# Patient Record
Sex: Male | Born: 1945
Health system: Southern US, Community
[De-identification: ages and names within clinical notes are randomized; demographics above are authoritative.]

## PROBLEM LIST (undated history)

## (undated) DIAGNOSIS — M5136 Other intervertebral disc degeneration, lumbar region: Secondary | ICD-10-CM

## (undated) DIAGNOSIS — G8929 Other chronic pain: Secondary | ICD-10-CM

## (undated) DIAGNOSIS — Z8781 Personal history of (healed) traumatic fracture: Secondary | ICD-10-CM

## (undated) DIAGNOSIS — I700 Atherosclerosis of aorta: Secondary | ICD-10-CM

## (undated) DIAGNOSIS — M1990 Unspecified osteoarthritis, unspecified site: Secondary | ICD-10-CM

## (undated) DIAGNOSIS — M25561 Pain in right knee: Secondary | ICD-10-CM

## (undated) DIAGNOSIS — M51369 Other intervertebral disc degeneration, lumbar region without mention of lumbar back pain or lower extremity pain: Secondary | ICD-10-CM

## (undated) DIAGNOSIS — M549 Dorsalgia, unspecified: Secondary | ICD-10-CM

## (undated) DIAGNOSIS — C186 Malignant neoplasm of descending colon: Secondary | ICD-10-CM

## (undated) DIAGNOSIS — Z8709 Personal history of other diseases of the respiratory system: Secondary | ICD-10-CM

## (undated) DIAGNOSIS — M25551 Pain in right hip: Secondary | ICD-10-CM

## (undated) DIAGNOSIS — K56609 Unspecified intestinal obstruction, unspecified as to partial versus complete obstruction: Secondary | ICD-10-CM

## (undated) DIAGNOSIS — N200 Calculus of kidney: Secondary | ICD-10-CM

## (undated) DIAGNOSIS — Z8639 Personal history of other endocrine, nutritional and metabolic disease: Secondary | ICD-10-CM

## (undated) DIAGNOSIS — L03114 Cellulitis of left upper limb: Secondary | ICD-10-CM

## (undated) HISTORY — PX: INNER EAR SURGERY: SHX679

## (undated) HISTORY — PX: COLOSTOMY: SHX63

## (undated) HISTORY — PX: PROSTATE BIOPSY: SHX241

## (undated) HISTORY — PX: COLONOSCOPY: SHX174

## (undated) HISTORY — PX: TONSILLECTOMY: SUR1361

## (undated) HISTORY — PX: BACK SURGERY: SHX140

## (undated) HISTORY — PX: COLON RESECTION: SHX5231

---

## 1999-01-23 ENCOUNTER — Ambulatory Visit (HOSPITAL_COMMUNITY): Admission: RE | Admit: 1999-01-23 | Discharge: 1999-01-23 | Payer: Self-pay | Admitting: Internal Medicine

## 1999-01-23 ENCOUNTER — Encounter: Payer: Self-pay | Admitting: Neurosurgery

## 1999-02-13 ENCOUNTER — Ambulatory Visit (HOSPITAL_COMMUNITY): Admission: RE | Admit: 1999-02-13 | Discharge: 1999-02-13 | Payer: Self-pay | Admitting: Neurosurgery

## 1999-02-13 ENCOUNTER — Encounter: Payer: Self-pay | Admitting: Neurosurgery

## 1999-02-27 ENCOUNTER — Ambulatory Visit (HOSPITAL_COMMUNITY): Admission: RE | Admit: 1999-02-27 | Discharge: 1999-02-27 | Payer: Self-pay | Admitting: Neurosurgery

## 1999-02-27 ENCOUNTER — Encounter: Payer: Self-pay | Admitting: Neurosurgery

## 1999-03-13 ENCOUNTER — Encounter: Payer: Self-pay | Admitting: Neurosurgery

## 1999-03-13 ENCOUNTER — Ambulatory Visit (HOSPITAL_COMMUNITY): Admission: RE | Admit: 1999-03-13 | Discharge: 1999-03-13 | Payer: Self-pay | Admitting: Neurosurgery

## 2005-05-15 ENCOUNTER — Inpatient Hospital Stay (HOSPITAL_COMMUNITY): Admission: RE | Admit: 2005-05-15 | Discharge: 2005-05-19 | Payer: Self-pay | Admitting: Neurosurgery

## 2005-05-15 ENCOUNTER — Encounter (INDEPENDENT_AMBULATORY_CARE_PROVIDER_SITE_OTHER): Payer: Self-pay | Admitting: Specialist

## 2005-06-10 ENCOUNTER — Encounter: Admission: RE | Admit: 2005-06-10 | Discharge: 2005-06-10 | Payer: Self-pay | Admitting: Thoracic Surgery

## 2005-07-08 ENCOUNTER — Encounter: Admission: RE | Admit: 2005-07-08 | Discharge: 2005-07-08 | Payer: Self-pay | Admitting: Thoracic Surgery

## 2005-09-09 ENCOUNTER — Encounter: Admission: RE | Admit: 2005-09-09 | Discharge: 2005-09-09 | Payer: Self-pay | Admitting: Thoracic Surgery

## 2005-12-16 ENCOUNTER — Encounter: Admission: RE | Admit: 2005-12-16 | Discharge: 2005-12-16 | Payer: Self-pay | Admitting: Thoracic Surgery

## 2006-05-24 ENCOUNTER — Ambulatory Visit (HOSPITAL_BASED_OUTPATIENT_CLINIC_OR_DEPARTMENT_OTHER): Admission: RE | Admit: 2006-05-24 | Discharge: 2006-05-25 | Payer: Self-pay | Admitting: Otolaryngology

## 2007-02-11 ENCOUNTER — Encounter: Admission: RE | Admit: 2007-02-11 | Discharge: 2007-02-11 | Payer: Self-pay | Admitting: Family Medicine

## 2007-03-17 DIAGNOSIS — D369 Benign neoplasm, unspecified site: Secondary | ICD-10-CM

## 2007-03-17 HISTORY — DX: Benign neoplasm, unspecified site: D36.9

## 2007-06-14 IMAGING — CR DG CHEST 1V PORT
1 series · 1 of 1 positions shown · non-contrast
Comparison: none

CLINICAL DATA: Status post placement of central line.
 PORTABLE CHEST - 1 VIEW - 05/15/05 AT 1095 HOURS:
 The right jugular central venous catheter tip is in the SVC.  No pneumothorax.

[view not recorded]
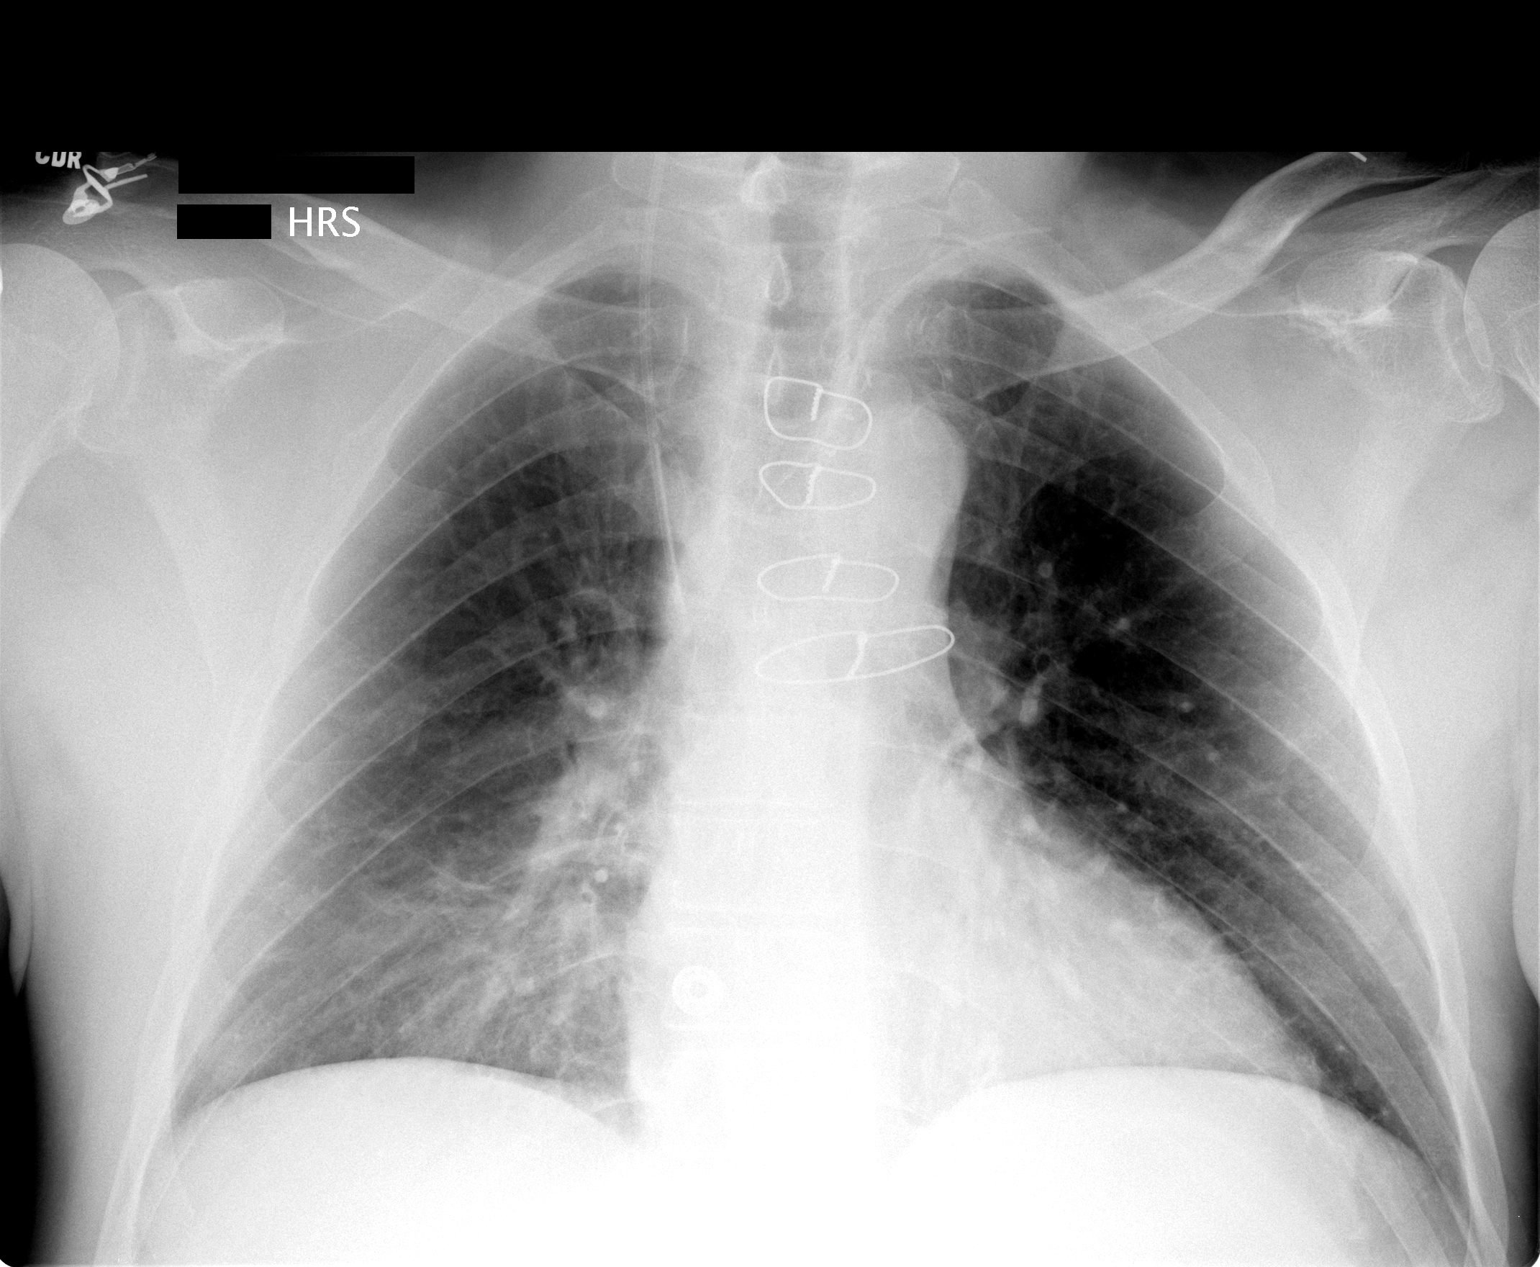

[1 of 1 positions shown; findings below may reference images not displayed]

IMPRESSION: No pneumothorax post placement of right IJ catheter.

## 2008-02-15 ENCOUNTER — Ambulatory Visit: Payer: Self-pay | Admitting: Vascular Surgery

## 2008-05-18 ENCOUNTER — Ambulatory Visit: Payer: Self-pay | Admitting: Vascular Surgery

## 2008-07-04 ENCOUNTER — Ambulatory Visit: Payer: Self-pay | Admitting: Vascular Surgery

## 2008-07-11 ENCOUNTER — Ambulatory Visit: Payer: Self-pay | Admitting: Vascular Surgery

## 2008-08-24 ENCOUNTER — Ambulatory Visit: Payer: Self-pay | Admitting: Vascular Surgery

## 2009-03-12 IMAGING — US US SCROTUM
1 series · 14 of 25 positions shown · non-contrast
Comparison: none

CLINICAL DATA: Reported right testicular mass felt on exam. 
SCROTAL ULTRASOUND:
TECHNIQUE: Complete ultrasound examination of the testicles, epididymis, and other scrotal structures was performed.

[Series 1: unknown · 0.09mm/px · 14 of 39 slices shown]
[im 1/39]
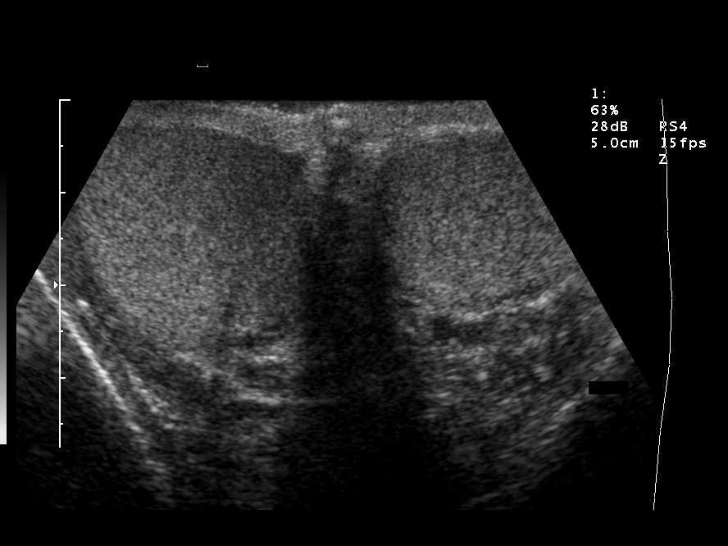
[im 4/39]
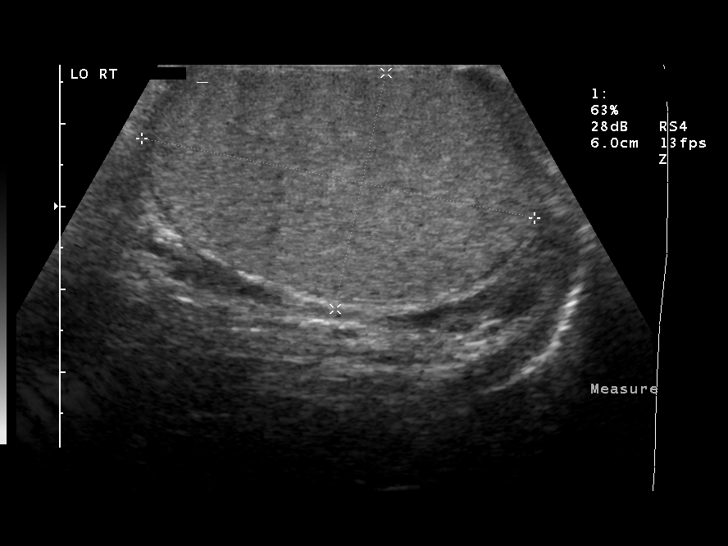
[im 7/39]
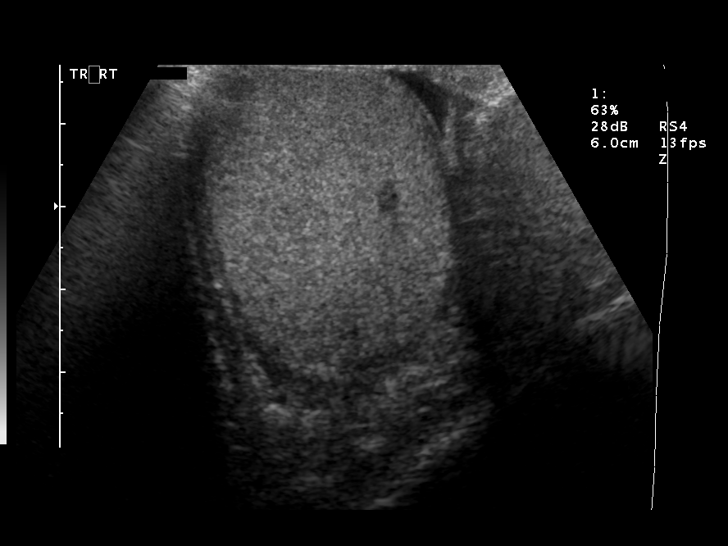
[im 10/39]
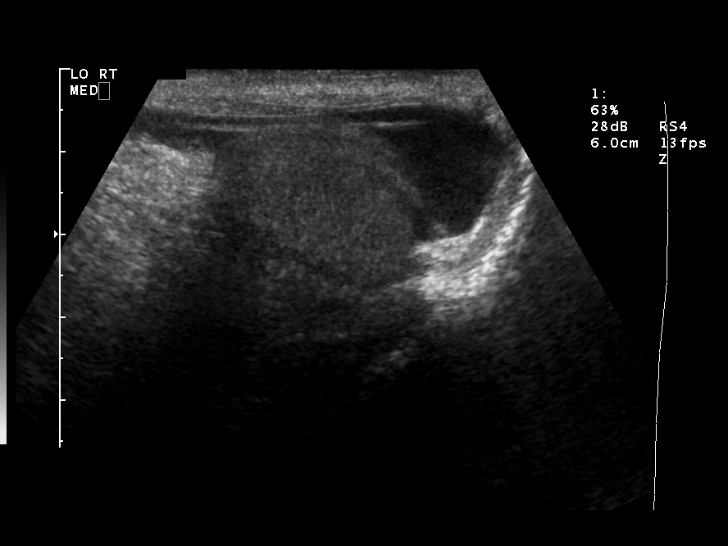
[im 13/39]
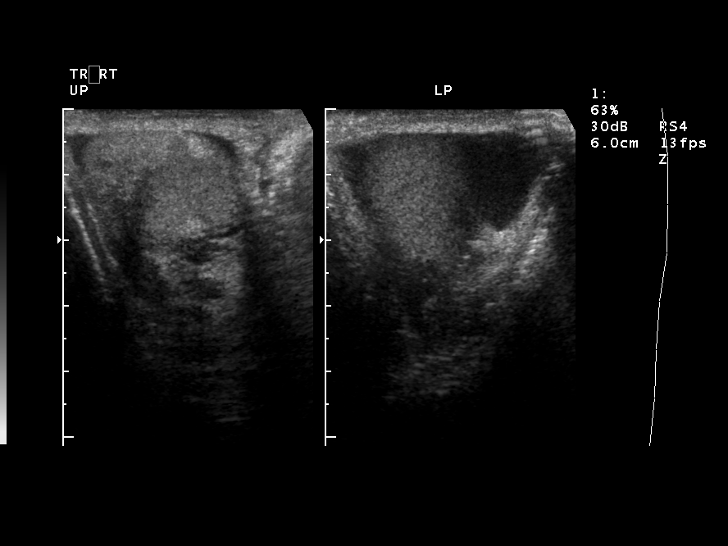
[im 15/39]
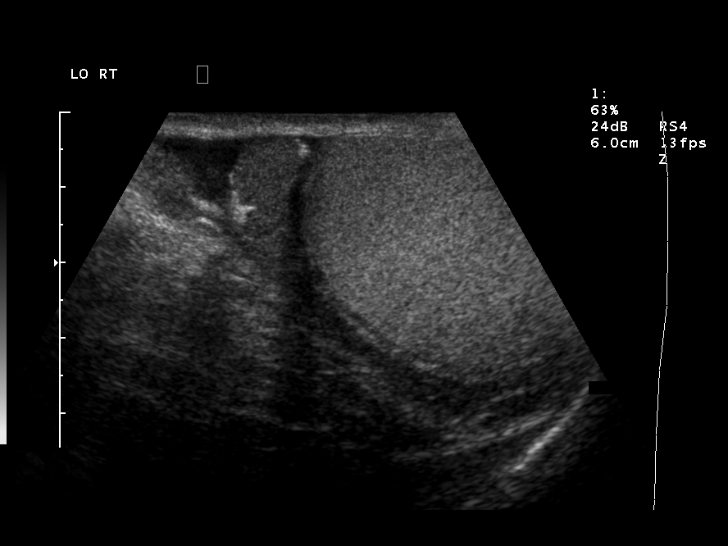
[im 18/39]
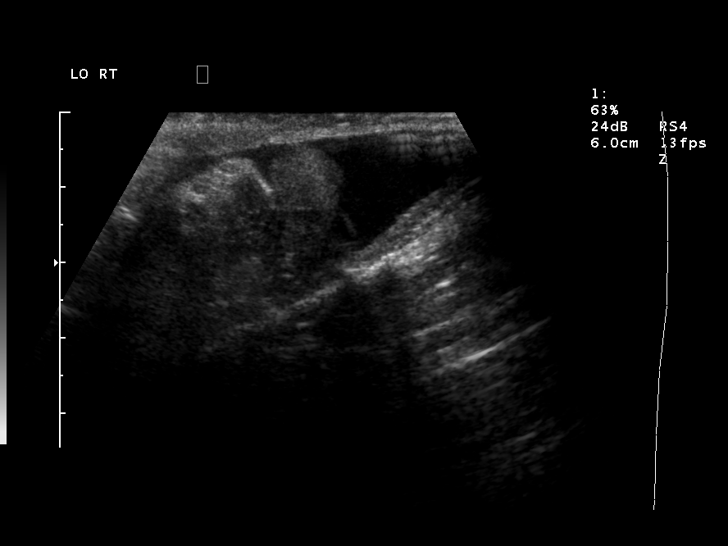
[im 21/39]
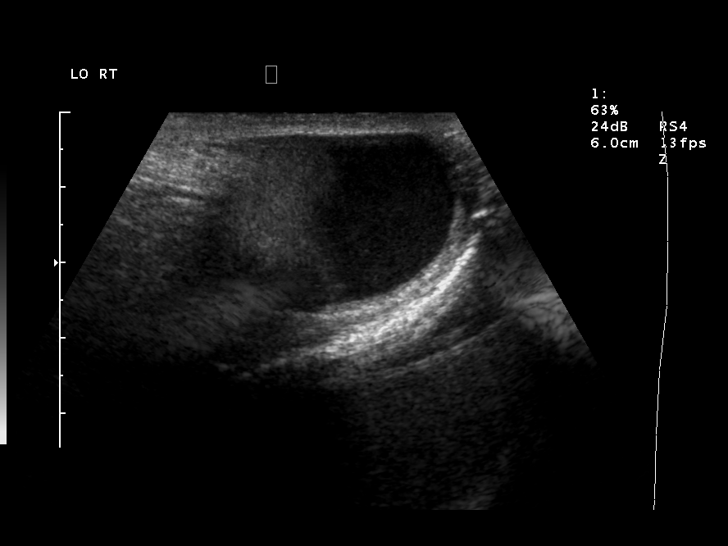
[im 24/39]
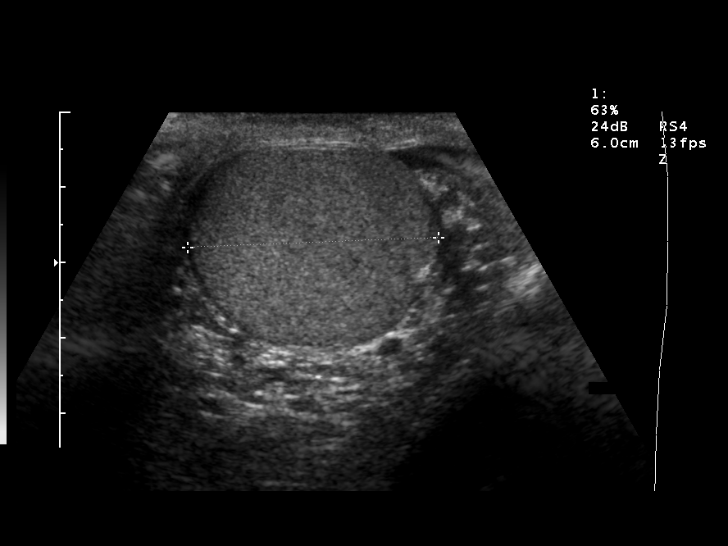
[im 26/39]
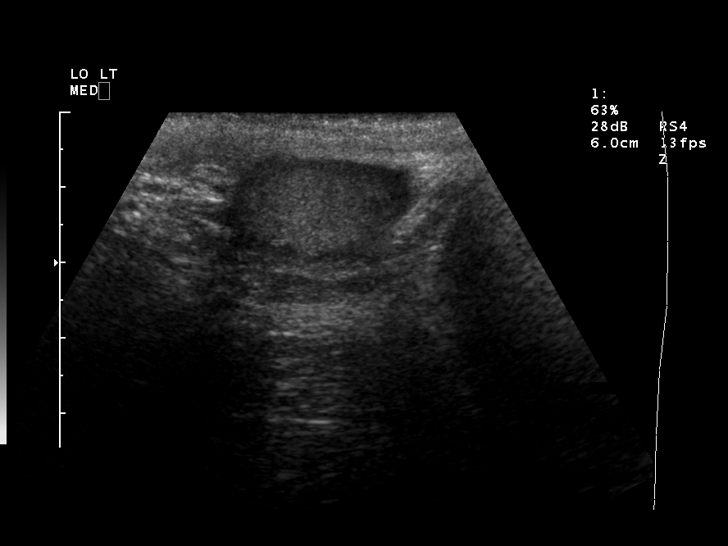
[im 29/39]
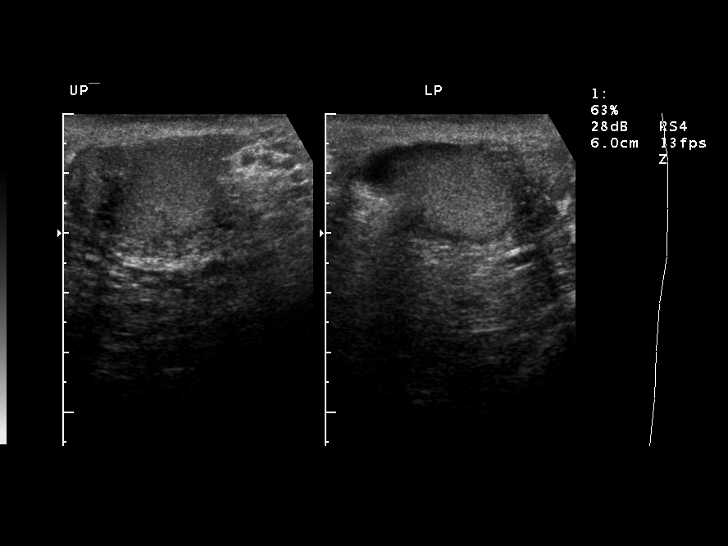
[im 32/39]
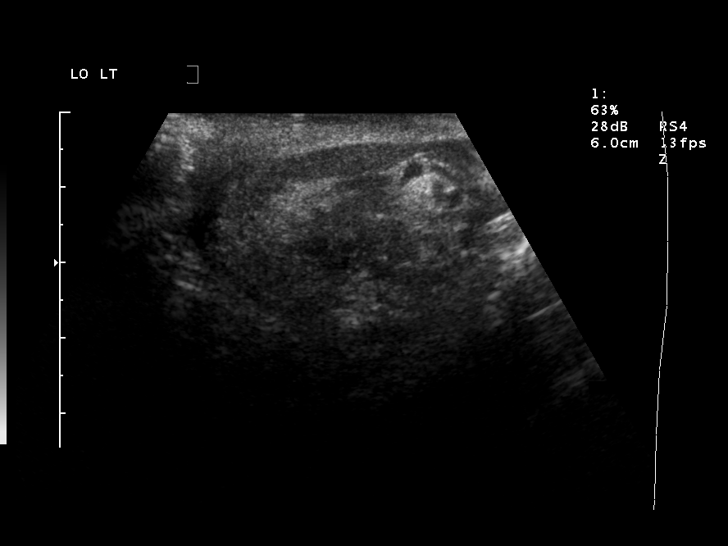
[im 35/39]
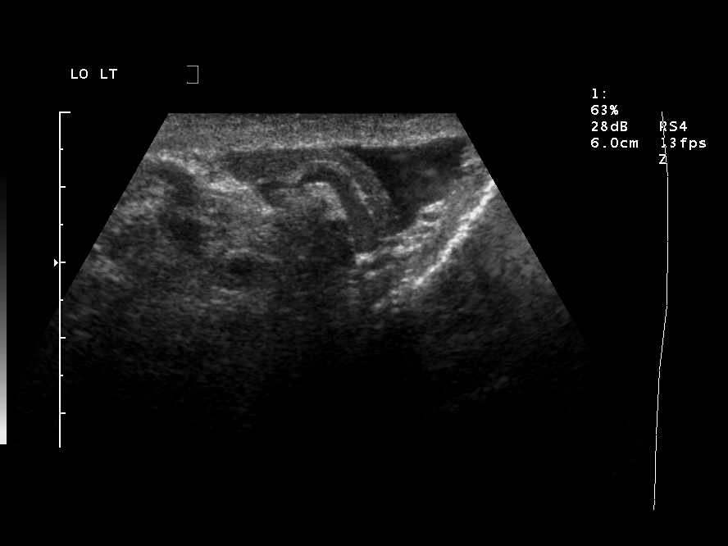
[im 39/39]
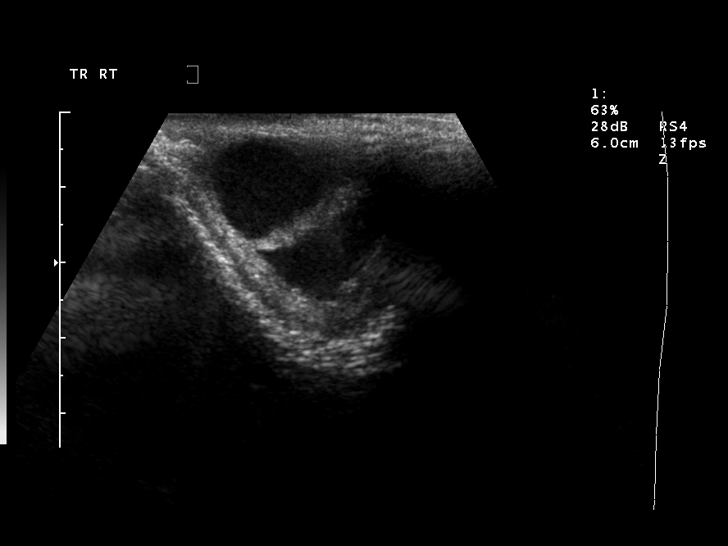

[14 of 25 positions shown; findings below may reference images not displayed]

FINDINGS: Right testicle measures 4.8 cm in length and 2.9 x 3.0 cm in transverse dimensions.  The left testicle measures 4.5 cm in length and 2.5 x 3.3 cm in transverse dimensions.  No evidence of a scrotal mass.  Epididymides unremarkable.  Hydroceles right larger than left.  No varicocele.  Normal color flow assessment.
IMPRESSION: Specifically, no evidence of a right testicular mass.  Hydroceles larger on the right than left.

## 2009-09-04 ENCOUNTER — Observation Stay (HOSPITAL_COMMUNITY): Admission: EM | Admit: 2009-09-04 | Discharge: 2009-09-04 | Payer: Self-pay | Admitting: Emergency Medicine

## 2011-03-05 LAB — DIFFERENTIAL
Basophils Absolute: 0.1 10*3/uL (ref 0.0–0.1)
Eosinophils Absolute: 0.1 10*3/uL (ref 0.0–0.7)
Lymphocytes Relative: 24 % (ref 12–46)
Lymphs Abs: 1.9 10*3/uL (ref 0.7–4.0)
Monocytes Absolute: 0.6 10*3/uL (ref 0.1–1.0)
Neutro Abs: 5.2 10*3/uL (ref 1.7–7.7)

## 2011-03-05 LAB — BASIC METABOLIC PANEL
BUN: 12 mg/dL (ref 6–23)
CO2: 27 mEq/L (ref 19–32)
Calcium: 9.3 mg/dL (ref 8.4–10.5)
Chloride: 103 mEq/L (ref 96–112)
Creatinine, Ser: 1.05 mg/dL (ref 0.4–1.5)

## 2011-03-05 LAB — CBC
Hemoglobin: 14.4 g/dL (ref 13.0–17.0)
MCHC: 33.7 g/dL (ref 30.0–36.0)
WBC: 8 10*3/uL (ref 4.0–10.5)

## 2011-04-14 NOTE — Assessment & Plan Note (Signed)
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  The  procedure was on 07/04/08.  He did quite well with the procedure.   He has resolved all postoperative bruising and reports that his leg  feels back to normal.  He does not have any recurrent varicosities.  He  is pleased with the results, as am I, and will see [place] again on an as-  needed basis.   Larina Earthly, M.D.  Electronically Signed   TFE/MEDQ  D:  08/24/2008  T:  08/27/2008  Job:  [id number]   cc:   L. Lupe Carney, M.D.

## 2011-04-14 NOTE — Consult Note (Signed)
NEW PATIENT CONSULTATION   James Doyle, James Doyle  DOB:  August 05, 1946                                       02/15/2008  ZOXWR#:[id number]   The patient presents today for evaluation of his right leg venous  varicosities.  He has seen Dr. Hart Rochester for similar evaluation in June  2007.  He has had a history of prior bilateral tributary varicosity  removal by Dr. Marcy Panning in 1999.  He has had recurrence of  varicosities in his right thigh, and has had progressive discomfort  related to these.  He does have pain specifically over these  varicosities and some lower extremity swelling as well.  He has  curtailed his usual activities related to this.  He did shoe horses in  the past, and this is impossible due to the pain associated with the  varicosities in the medial thigh in holding the horses hoof.  He did  wear compression garments for a period of time and did not see any  specific relief, but has not tried these for several years.  Venous  duplex at that time revealed reflux in his surface veins in the right  and no deep venous reflux.   PHYSICAL EXAM:  He does have 2+ dorsalis pedis pulse and does have  marked varicosities in his medial thigh.  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  I discussed options again with the patient.  We will refit him  with graduated compression stockings.  I plan to see him again in 3  months with a repeat forma duplex, since it has been several years since  his last study.  I suspect he would be an excellent candidate for laser  ablation and stab phlebectomy should he fail treatment from the  compression garments.   Larina Earthly, M.D.  Electronically Signed   TFE/MEDQ  D:  02/15/2008  T:  02/16/2008  Job:  [id number]   cc:   L. Lupe Carney, M.D.

## 2011-04-14 NOTE — Assessment & Plan Note (Signed)
OFFICE VISIT   James Doyle, James Doyle  DOB:  08-31-1946                                       07/11/2008  ZOXWR#:[id number]   The patient presents today for 1 week followup laser ablation of his  right great saphenous vein and stab phlebectomy of multiple tributary  varicosities.  He has done quite well following the procedure.  He has  the usual amount of bruising and erythema which is resolving.  All of  his stab phlebectomy sites are healing nicely.   He underwent repeat duplex in our office today and this revealed no  evidence of injury to the deep venous system and closure of his  saphenous vein.   I am quite pleased with his initial result as is the patient.  We plan  to see him again in 6 weeks for final followup.   Larina Earthly, M.D.  Electronically Signed   TFE/MEDQ  D:  07/11/2008  T:  07/12/2008  Job:  [id number]   cc:   L. Lupe Carney, M.D.

## 2011-04-14 NOTE — Assessment & Plan Note (Signed)
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  He has  worn graduated compression stockings, thigh-high for 3 months.  He  reports that this has not given any improvement regarding the pain that  he has over these areas.  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  He also has complaints of  prolonged sitting if he is traveling in the car, complaining of  difficulty with leg pain and swelling, and also difficulty with yard  work that requires squatting position due to pain.   PHYSICAL EXAM:  He does have marked saphenous varicosities in his right  thigh.   He underwent a formal duplex today, and this reveals significant reflux  in his saphenous veins form the saphenofemoral junction to the mid thigh  where a large nest of varicosities arise.  I have recommended that we  proceed with laser ablation of his right greater saphenous vein from mid  thigh proximally and stab phlebectomy with multiple large tributaries  throughout his more distal thigh.  He understands that the procedure is  an outpatient procedure under local anesthesia, and that he will be able  to be ambulatory immediately following the procedure.  We will schedule  this at his earliest convenience.   Larina Earthly, M.D.  Electronically Signed   TFE/MEDQ  D:  05/18/2008  T:  05/21/2008  Job:  [id number]   cc:   L. Lupe Carney, M.D.

## 2011-04-14 NOTE — Procedures (Signed)
DUPLEX DEEP VENOUS EXAM - LOWER EXTREMITY   INDICATION:  Followup, right greater saphenous vein ablation.   HISTORY:  Edema:  No.  Trauma/Surgery:  Yes.  Pain:  No.  PE:  No.  Previous DVT:  No.  Anticoagulants:  Other:   DUPLEX EXAM:                CFV   SFV   PopV  PTV    GSV                R  L  R  L  R  L  R   L  R  L  Thrombosis    o     o     o     o      +  Spontaneous   +     +     +     +      0  Phasic        +     +     +     +      0  Augmentation  +     +     +     +      0  Compressible  +     +     +     +      0  Competent     +     +     +     +      0   Legend:  + - yes  o - no  p - partial  D - decreased   IMPRESSION:  1. No evidence of deep venous thrombosis noted in the right leg.  2. Right greater saphenous vein appears thrombosed from proximal to      mid-to-distal thigh.    _____________________________  Larina Earthly, M.D.   MG/MEDQ  D:  07/11/2008  T:  07/11/2008  Job:  [id number]

## 2011-04-14 NOTE — Procedures (Signed)
LOWER EXTREMITY VENOUS REFLUX EXAM   INDICATION:  Right leg varicose vein with pain and swelling.   EXAM:  Using color-flow imaging and pulse Doppler spectral analysis, the  right common femoral, superficial femoral, popliteal, posterior tibial,  greater and lesser saphenous veins are evaluated.  There is no evidence  suggesting deep venous insufficiency in the right lower extremity.   The right saphenofemoral junction is not competent.  The right GSV is  not competent with the caliber as described below.   The right proximal short saphenous vein demonstrates competency.   GSV Diameter (used if found to be incompetent only)                                            Right    Left  Proximal Greater Saphenous Vein           0.89 cm  cm  Proximal-to-mid-thigh                     0.89 cm  cm  Mid thigh                                 0.77 cm  cm  Mid-distal thigh                          0.77 cm  cm  Distal thigh                              0.56 cm  cm  Knee                                      0.54 cm  cm   IMPRESSION:  1. The right greater saphenous vein reflux is identified with the      caliber ranging from 0.54 cm to 0.97 cm knee to groin.  2. The right greater saphenous vein is not aneurysmal.  3. The right greater saphenous vein is tortuous, distal thigh and      below.  4. The deep venous system is competent.  5. The right lesser saphenous vein is competent.  6. There is no evidence of deep venous thrombosis noted in the right      leg.   ___________________________________________  Larina Earthly, M.D.   MG/MEDQ  D:  05/18/2008  T:  05/18/2008  Job:  [id number]

## 2011-04-17 NOTE — Discharge Summary (Signed)
NAMEHUTCH, James Doyle                ACCOUNT NO.:  [id number]   MEDICAL RECORD NO.:  [id number]          PATIENT TYPE:  INP   LOCATION:  3006                         FACILITY:  [facility]   PHYSICIAN:  Coletta Memos, M.D.     DATE OF BIRTH:  11/25/1946   DATE OF ADMISSION:  05/15/2005  DATE OF DISCHARGE:  05/19/2005                                 DISCHARGE SUMMARY   ADMISSION DIAGNOSIS:  Displaced disk, left T1-2.   DISCHARGE DIAGNOSES:  1.  Displaced disk, left T1-2.  2.  Cervical radiculopathy.   PROCEDURE:  Anterior cervical decompression T1-2 with arthrodesis using  allograft via sternal- splitting approach, performed by Dr. Ines Bloomer.  The neurosurgical procedure was performed by myself, Dr. Coletta Memos.   DISCHARGE STATUS:  Alive and well.   MEDICATIONS:  Vicodin and Flexeril.   COMPLICATIONS:  Mild recurrent laryngeal nerve palsy. The patient is hoarse  now, versus his original voice.  No problems with swallowing.  No problems  with eating.  The wound is clean and dry without problems of infection at  discharge.   HISTORY:  [name] is a patient of mine whom I had done a lumbar procedure  on in the past.  He presented and had a large disk herniation at T1-2.  I  therefore recommended and he agreed to undergo a T1-2 diskectomy and  arthrodesis.   HOSPITAL COURSE:  This was done with the assistance of Dr. Edwyna Shell, in order  to gain adequate exposure.  The patient had an uncomplicated procedure.  Postoperatively he did quite well.   DISPOSITION/CONDITION ON DISCHARGE:  He was discharged home after checking x-  rays with the JP removed.  No re-accumulation of fluid.  His wound is clean  and dry without signs of infection at discharge.  He is tolerating a regular  diet, ambulating and voiding without difficulty.   FOLLOWUP:  1.  I will see him back in the office in approximately three to four weeks.  2.  He has an appointment with Dr. Edwyna Shell.      KC/MEDQ  D:   05/19/2005  T:  05/19/2005  Job:  [id number]

## 2011-04-17 NOTE — Op Note (Signed)
James Doyle, James Doyle                ACCOUNT NO.:  [id number]   MEDICAL RECORD NO.:  [id number]          PATIENT TYPE:  AMB   LOCATION:  DSC                          FACILITY:  [facility]   PHYSICIAN:  Jefry H. Pollyann Kennedy, MD     DATE OF BIRTH:  [birth year]   DATE OF PROCEDURE:  05/24/2006  DATE OF DISCHARGE:                                 OPERATIVE REPORT   PREOPERATIVE DIAGNOSIS:  Left vocal cord paralysis with dysphonia.   POSTOPERATIVE DIAGNOSIS:  Left vocal cord paralysis with dysphonia.   PROCEDURE:  Left vocal cord medialization laryngoplasty.   SURGEON:  Dr. Pollyann Kennedy.   ANESTHESIA:  Local anesthesia was used with intravenous sedation.   COMPLICATIONS:  No complications.   FINDINGS:  Paralysis with left paramedian position of the vocal cord.   PHYSICIAN:  Dr. Lupe Carney.   BLOOD LOSS:  Minimal.   HISTORY:  A 65 year old gentleman underwent a cervical disk surgery last  year and developed a postoperative left vocal cord paralysis.  He has a  fairly good voice early the morning, but it fatigues and he looses his  efficiency and his strength and stamina as the day goes on.  He has been  followed in the office for several months without any evidence of  resolution.  Risks, benefits, alternatives, complications of the procedure  were explained to the patient who seemed to understand and agree to surgery.   PROCEDURE:  The patient was taken to the operating room and placed on the  operating table in a supine position.  Following induction of intravenous  sedation and topical anesthesia in the right side of the nose, a fiberoptic  flexible laryngoscope was inserted into the right side of nose and used with  a video to examine the larynx.  This was placed on a Mayo stand above the  patient's head for visualization during the procedure.  The neck was prepped  and draped in standard fashion.  Xylocaine with epinephrine was infiltrated  along a cervical skin crease at the mid portion of  the thyroid cartilage.  A  15 scalpel was used to create an incision about 4 cm in length to the left  of midline along the skin crease.  Sharp and electrocautery dissection was  used to dissect down to the anterior aspect of the thyroid ala.  The  periosteum was reflected laterally.  A high-speed side-cutting bur drill was  used to create the window in the thyroid lamina.  It was approximately 2 or  3 mm above the inferior aspect and about 3 mm to the left of the midline.  It was about 0.5 cm in height and 1 cm in length.  The inner periosteum was  then elevated using a Public house manager.  Using video laryngoscopy at the  same time, I was able to assess the position of the vocal cord in relation  to the window.  A piece of Silastic was then cut with a wedge-shaped  insertion to fit within the thyroid window, and multiple modifications were  made to this while having the patient phonate.  After good positioning of  the cord was achieved with good vocal results, the wound was irrigated with  saline and closed in layers using 4-0 chromic in the  deeper layers and 4-0 nylon on the skin.  A rubber band drain was kept in  the wound and exited through the anterior aspect of the incision.  A  dressing was applied.  The patient was then awakened and transferred to  recovery in stable condition.      Jefry H. Pollyann Kennedy, MD  Electronically Signed     JHR/MEDQ  D:  05/24/2006  T:  05/24/2006  Job:  [id number]   cc:   L. Lupe Carney, M.D.  Fax: [phone]   Ines Bloomer, M.D.  [address]  [place]  [place] [address]   Coletta Memos, M.D.  Fax: [phone]

## 2011-04-17 NOTE — Op Note (Signed)
NAMESAMIT, SYLVE NO.:  [id number]   MEDICAL RECORD NO.:  [id number]          PATIENT TYPE:  INP   LOCATION:  2899                         FACILITY:  [facility]   PHYSICIAN:  Coletta Memos, M.D.     DATE OF BIRTH:  [birth year]   DATE OF PROCEDURE:  05/15/2005  DATE OF DISCHARGE:                                 OPERATIVE REPORT   PREOPERATIVE DIAGNOSIS:  Displaced disk, T1-2, left.   POSTOPERATIVE DIAGNOSES:  1.  T1-2 herniated nucleus pulposus, left.  2.  Thoracic radiculopathy.   PROCEDURE:  Anterior cervical decompression, T1-2; arthrodesis, T1-2,  approach performed by Dr. Mattie Marlin including median sternotomy.   CO-SURGEON:  Coletta Memos, M.D.   Mammie LorenzoInes Bloomer, M.D.   ASSISTANT:  Hewitt Shorts, M.D.   INDICATIONS:  Kelven Flater is a 65 year old who presented with pain in a T1  distribution on the left side secondary to what was a fairly large herniated  disk at T1-2 on the left side.  I therefore recommended and he agreed to  undergo operative decompression.  However, since the disk was at T1-2, I  felt it best that the approach be performed by  Dr. Karle Plumber, general  thoracic surgeon, and he will dictate under separate cover his approach.   OPERATIVE NOTE:  After approach had been finalized, I placed spinal needles  at what turned out to be C6-7 and then counted down to the disk space of  what I believed to be T1-2.  I had Dr. Shirlean Kelly, my partner, also  count.  I then opened the disk space with a #15 blade and with the use of  microscopic dissection at T1-2, I proceeded to remove the disk and remove  some of the osteophytes.  With Dr. Earl Gala assistance, he and I were able  to do this and fully decompress the left T1 nerve root.  We did not at all  do an aggressive decompression of the right nerve root, as he had had no  problems on that side.  After that was performed, there was a space within  the bone and what I did  was size a piece of iliac crest graft and shape it.  I then placed that into the disk space.  I did not place a plate as the bone  was quite secure and I thought that the extra time and possible risks of  placing a plate were not worth the possible additional stability.  I  therefore irrigated.  Dr. Edwyna Shell then closed the wound in layered fashion  and again that as dictated under separate cover.       KC/MEDQ  D:  05/15/2005  T:  05/16/2005  Job:  [id number]

## 2011-04-17 NOTE — Op Note (Signed)
NAMECORLEY, MAFFEO NO.:  [id number]   MEDICAL RECORD NO.:  [id number]          PATIENT TYPE:  INP   LOCATION:  2899                         FACILITY:  [facility]   PHYSICIAN:  Ines Bloomer, M.D. DATE OF BIRTH:  [birth year]   DATE OF PROCEDURE:  05/15/2005  DATE OF DISCHARGE:                                 OPERATIVE REPORT   PREOPERATIVE DIAGNOSIS:  T1-T2 disc.   POSTOPERATIVE DIAGNOSIS:  T1-T2 disc.   OPERATION PERFORMED:  Exposure for T1-T2 discectomy with partial median  sternotomy.   SURGEON:  Ines Bloomer, M.D.   FIRST ASSISTANT:  Rowe Clack, P.A.-C.  Coletta Memos, M.D.   After general anesthesia, the patient was prepped and draped in the usual  sterile fashion in the supine position.  This patient had a T1-T2 central  disc and we were asked to do the exposure.  An incision was made along the  left sternocleidomastoid to the sternal notch and then down the midline to  the manubrial sternal junction.  The muscle was divided with electrocautery  over the sternum.  The sternal notch was dissected out and the sternum was  split with the sternal saw down to just below the angle of Louis.  A laminar  spreader was placed in the wound and Gelfoam was applied.  The incision was  dissected out superiorly dissecting the sternocleidomastoid muscle  laterally, identifying the internal jugular vein, artery, and the vagus  nerve and these were all dissected laterally and the omohyoid was divided.  Then, the sternal hyoid attachments to the sternum and the sternoclavicular  attachments to the sternum were all taken down with electrocautery.  The  thyroid and esophagus and trachea were all pushed medially to get down to  the vertebral body.  The left recurrent nerve was identified and preserved.  Exposure was made from C6 down to T3.  Dr. Franky Macho did a T1-T2 discectomy  without difficulty.  After this had been done, a Jackson-Pratt  was placed  in the wound and  the sternum was closed with four #6 wires in a twisted  fashion, 2-0 Vicryl to reattach the sternohyoid and sternoclavicular muscle,  #1 Vicryl in the pectoral muscles, 2-0 Vicryl to reapproximated the  sternocleidomastoid, and 3-0 Vicryl as a subcuticular stitch and Dermabond  for the skin.  The patient was returned to the recovery room in stable  condition.       DPB/MEDQ  D:  05/15/2005  T:  05/15/2005  Job:  [id number]

## 2015-08-02 ENCOUNTER — Other Ambulatory Visit: Payer: Self-pay | Admitting: Family Medicine

## 2015-08-02 ENCOUNTER — Ambulatory Visit
Admission: RE | Admit: 2015-08-02 | Discharge: 2015-08-02 | Disposition: A | Discharge status: Home / Self Care | Payer: Medicare Other | Source: Ambulatory Visit | Attending: Family Medicine | Admitting: Family Medicine

## 2015-08-02 DIAGNOSIS — M150 Primary generalized (osteo)arthritis: Secondary | ICD-10-CM

## 2016-07-03 ENCOUNTER — Ambulatory Visit: Payer: Self-pay | Admitting: Orthopedic Surgery

## 2016-07-17 ENCOUNTER — Encounter (HOSPITAL_COMMUNITY)
Admission: RE | Admit: 2016-07-17 | Discharge: 2016-07-17 | Disposition: A | Discharge status: Home / Self Care | Payer: Medicare Other | Source: Ambulatory Visit | Attending: Orthopedic Surgery | Admitting: Orthopedic Surgery

## 2016-07-17 ENCOUNTER — Encounter (HOSPITAL_COMMUNITY): Payer: Self-pay

## 2016-07-17 DIAGNOSIS — Z01812 Encounter for preprocedural laboratory examination: Secondary | ICD-10-CM | POA: Insufficient documentation

## 2016-07-17 HISTORY — DX: Unspecified osteoarthritis, unspecified site: M19.90

## 2016-07-17 HISTORY — DX: Personal history of other diseases of the respiratory system: Z87.09

## 2016-07-17 LAB — COMPREHENSIVE METABOLIC PANEL
ALT: 23 U/L (ref 17–63)
ANION GAP: 7 (ref 5–15)
AST: 24 U/L (ref 15–41)
Albumin: 4.3 g/dL (ref 3.5–5.0)
Alkaline Phosphatase: 90 U/L (ref 38–126)
BILIRUBIN TOTAL: 0.6 mg/dL (ref 0.3–1.2)
BUN: 15 mg/dL (ref 6–20)
CO2: 29 mmol/L (ref 22–32)
Calcium: 9.2 mg/dL (ref 8.9–10.3)
Chloride: 105 mmol/L (ref 101–111)
Creatinine, Ser: 1.12 mg/dL (ref 0.61–1.24)
Glucose, Bld: 97 mg/dL (ref 65–99)
POTASSIUM: 3.7 mmol/L (ref 3.5–5.1)
Sodium: 141 mmol/L (ref 135–145)
TOTAL PROTEIN: 7.5 g/dL (ref 6.5–8.1)

## 2016-07-17 LAB — CBC
HEMATOCRIT: 43.8 % (ref 39.0–52.0)
Hemoglobin: 14.3 g/dL (ref 13.0–17.0)
MCH: 28.9 pg (ref 26.0–34.0)
MCHC: 32.6 g/dL (ref 30.0–36.0)
MCV: 88.7 fL (ref 78.0–100.0)
Platelets: 207 10*3/uL (ref 150–400)
RBC: 4.94 MIL/uL (ref 4.22–5.81)
RDW: 13.3 % (ref 11.5–15.5)
WBC: 11.7 10*3/uL — ABNORMAL HIGH (ref 4.0–10.5)

## 2016-07-17 LAB — URINALYSIS, ROUTINE W REFLEX MICROSCOPIC
GLUCOSE, UA: NEGATIVE mg/dL
HGB URINE DIPSTICK: NEGATIVE
Ketones, ur: 40 mg/dL — AB
Nitrite: NEGATIVE
Protein, ur: 30 mg/dL — AB
SPECIFIC GRAVITY, URINE: 1.02 (ref 1.005–1.030)
pH: 6 (ref 5.0–8.0)

## 2016-07-17 LAB — URINE MICROSCOPIC-ADD ON

## 2016-07-17 LAB — PROTIME-INR
INR: 0.99
PROTHROMBIN TIME: 13.1 s (ref 11.4–15.2)

## 2016-07-17 LAB — APTT: APTT: 33 s (ref 24–36)

## 2016-07-17 LAB — SURGICAL PCR SCREEN
MRSA, PCR: NEGATIVE
Staphylococcus aureus: NEGATIVE

## 2016-07-17 NOTE — Patient Instructions (Signed)
James Doyle  07/17/2016   Your procedure is scheduled on: Monday July 27, 2016  Report to [facility]  Entrance take [facility]  elevators to 3rd floor to  [place] at 6:00 AM.  Call this number if you have problems the morning of surgery [phone]   Remember: ONLY 1 PERSON MAY GO WITH YOU TO SHORT STAY TO GET  READY MORNING OF [place].  Do not eat food or drink liquids :After Midnight.     Take these medicines the morning of surgery: NONE             You may not have any metal on your body including hair pins and              piercings  Do not wear jewelry,  lotions, powders or colognes, deodorant             Men may shave face and neck.   Do not bring valuables to the hospital. [place].  Contacts, dentures or bridgework may not be worn into surgery.  Leave suitcase in the car. After surgery it may be brought to your room.                Please read over the following fact sheets you were given:MRSA INFORMATION SHEET; INCENTIVE SPIROMETER; BLOOD TRANSFUSION INFORMATION SHEET  _____________________________________________________________________             [facility] - Preparing for Surgery Before surgery, you can play an important role.  Because skin is not sterile, your skin needs to be as free of germs as possible.  You can reduce the number of germs on your skin by washing with CHG (chlorahexidine gluconate) soap before surgery.  CHG is an antiseptic cleaner which kills germs and bonds with the skin to continue killing germs even after washing. Please DO NOT use if you have an allergy to CHG or antibacterial soaps.  If your skin becomes reddened/irritated stop using the CHG and inform your nurse when you arrive at Short Stay. Do not shave (including legs and underarms) for at least 48 hours prior to the first CHG shower.  You may shave your face/neck. Please follow these instructions  carefully:  1.  Shower with CHG Soap the night before surgery and the  morning of Surgery.  2.  If you choose to wash your hair, wash your hair first as usual with your  normal  shampoo.  3.  After you shampoo, rinse your hair and body thoroughly to remove the  shampoo.                           4.  Use CHG as you would any other liquid soap.  You can apply chg directly  to the skin and wash                       Gently with a scrungie or clean washcloth.  5.  Apply the CHG Soap to your body ONLY FROM THE NECK DOWN.   Do not use on face/ open  Wound or open sores. Avoid contact with eyes, ears mouth and genitals (private parts).                       Wash face,  Genitals (private parts) with your normal soap.             6.  Wash thoroughly, paying special attention to the area where your surgery  will be performed.  7.  Thoroughly rinse your body with warm water from the neck down.  8.  DO NOT shower/wash with your normal soap after using and rinsing off  the CHG Soap.                9.  Pat yourself dry with a clean towel.            10.  Wear clean pajamas.            11.  Place clean sheets on your bed the night of your first shower and do not  sleep with pets. Day of Surgery : Do not apply any lotions/deodorants the morning of surgery.  Please wear clean clothes to the hospital/surgery center.  FAILURE TO FOLLOW THESE INSTRUCTIONS MAY RESULT IN THE CANCELLATION OF YOUR SURGERY PATIENT SIGNATURE_________________________________  NURSE SIGNATURE__________________________________  ________________________________________________________________________   Adam Phenix  An incentive spirometer is a tool that can help keep your lungs clear and active. This tool measures how well you are filling your lungs with each breath. Taking long deep breaths may help reverse or decrease the chance of developing breathing (pulmonary) problems (especially infection)  following:  A long period of time when you are unable to move or be active. BEFORE THE PROCEDURE   If the spirometer includes an indicator to show your best effort, your nurse or respiratory therapist will set it to a desired goal.  If possible, sit up straight or lean slightly forward. Try not to slouch.  Hold the incentive spirometer in an upright position. INSTRUCTIONS FOR USE  1. Sit on the edge of your bed if possible, or sit up as far as you can in bed or on a chair. 2. Hold the incentive spirometer in an upright position. 3. Breathe out normally. 4. Place the mouthpiece in your mouth and seal your lips tightly around it. 5. Breathe in slowly and as deeply as possible, raising the piston or the ball toward the top of the column. 6. Hold your breath for 3-5 seconds or for as long as possible. Allow the piston or ball to fall to the bottom of the column. 7. Remove the mouthpiece from your mouth and breathe out normally. 8. Rest for a few seconds and repeat Steps 1 through 7 at least 10 times every 1-2 hours when you are awake. Take your time and take a few normal breaths between deep breaths. 9. The spirometer may include an indicator to show your best effort. Use the indicator as a goal to work toward during each repetition. 10. After each set of 10 deep breaths, practice coughing to be sure your lungs are clear. If you have an incision (the cut made at the time of surgery), support your incision when coughing by placing a pillow or rolled up towels firmly against it. Once you are able to get out of bed, walk around indoors and cough well. You may stop using the incentive spirometer when instructed by your caregiver.  RISKS AND COMPLICATIONS  Take your time so you do not get  dizzy or light-headed.  If you are in pain, you may need to take or ask for pain medication before doing incentive spirometry. It is harder to take a deep breath if you are having pain. AFTER USE  Rest and  breathe slowly and easily.  It can be helpful to keep track of a log of your progress. Your caregiver can provide you with a simple table to help with this. If you are using the spirometer at home, follow these instructions: [place] IF:   You are having difficultly using the spirometer.  You have trouble using the spirometer as often as instructed.  Your pain medication is not giving enough relief while using the spirometer.  You develop fever of 100.5 F (38.1 C) or higher. SEEK IMMEDIATE MEDICAL CARE IF:   You cough up bloody sputum that had not been present before.  You develop fever of 102 F (38.9 C) or greater.  You develop worsening pain at or near the incision site. MAKE SURE YOU:   Understand these instructions.  Will watch your condition.  Will get help right away if you are not doing well or get worse. Document Released: 03/29/2007 Document Revised: 02/08/2012 Document Reviewed: 05/30/2007 ExitCare Patient Information 2014 Exit[employer], [place].   ________________________________________________________________________  WHAT IS A BLOOD TRANSFUSION? Blood Transfusion Information  A transfusion is the replacement of blood or some of its parts. Blood is made up of multiple cells which provide different functions.  Red blood cells carry oxygen and are used for blood loss replacement.  White blood cells fight against infection.  Platelets control bleeding.  Plasma helps clot blood.  Other blood products are available for specialized needs, such as hemophilia or other clotting disorders. BEFORE THE TRANSFUSION  Who gives blood for transfusions?   Healthy volunteers who are fully evaluated to make sure their blood is safe. This is blood bank blood. Transfusion therapy is the safest it has ever been in the practice of medicine. Before blood is taken from a donor, a complete history is taken to make sure that person has no history of diseases nor engages in  risky social behavior (examples are intravenous drug use or sexual activity with multiple partners). The donor's travel history is screened to minimize risk of transmitting infections, such as malaria. The donated blood is tested for signs of infectious diseases, such as HIV and hepatitis. The blood is then tested to be sure it is compatible with you in order to minimize the chance of a transfusion reaction. If you or a relative donates blood, this is often done in anticipation of surgery and is not appropriate for emergency situations. It takes many days to process the donated blood. RISKS AND COMPLICATIONS Although transfusion therapy is very safe and saves many lives, the main dangers of transfusion include:   Getting an infectious disease.  Developing a transfusion reaction. This is an allergic reaction to something in the blood you were given. Every precaution is taken to prevent this. The decision to have a blood transfusion has been considered carefully by your caregiver before blood is given. Blood is not given unless the benefits outweigh the risks. AFTER THE TRANSFUSION  Right after receiving a blood transfusion, you will usually feel much better and more energetic. This is especially true if your red blood cells have gotten low (anemic). The transfusion raises the level of the red blood cells which carry oxygen, and this usually causes an energy increase.  The nurse administering the transfusion will  monitor you carefully for complications. HOME CARE INSTRUCTIONS  No special instructions are needed after a transfusion. You may find your energy is better. Speak with your caregiver about any limitations on activity for underlying diseases you may have. SEEK MEDICAL CARE IF:   Your condition is not improving after your transfusion.  You develop redness or irritation at the intravenous (IV) site. SEEK IMMEDIATE MEDICAL CARE IF:  Any of the following symptoms occur over the next 12  hours:  Shaking chills.  You have a temperature by mouth above 102 F (38.9 C), not controlled by medicine.  Chest, back, or muscle pain.  People around you feel you are not acting correctly or are confused.  Shortness of breath or difficulty breathing.  Dizziness and fainting.  You get a rash or develop hives.  You have a decrease in urine output.  Your urine turns a dark color or changes to pink, red, or brown. Any of the following symptoms occur over the next 10 days:  You have a temperature by mouth above 102 F (38.9 C), not controlled by medicine.  Shortness of breath.  Weakness after normal activity.  The white part of the eye turns yellow (jaundice).  You have a decrease in the amount of urine or are urinating less often.  Your urine turns a dark color or changes to pink, red, or brown. Document Released: 11/13/2000 Document Revised: 02/08/2012 Document Reviewed: 07/02/2008 [facility] Patient Information 2014 [place], [place].  _______________________________________________________________________

## 2016-07-17 NOTE — Progress Notes (Signed)
Clearance note per chart per Dr Alroy Dust 10/11/2015

## 2016-07-17 NOTE — Progress Notes (Signed)
Urinalysis and micro results / epic per PAT visit 07/17/2016 sent to Dr Wynelle Link

## 2016-07-18 LAB — ABO/RH: ABO/RH(D): O POS

## 2016-07-22 NOTE — H&P (Signed)
TOTAL KNEE ADMISSION H&P  Patient is being admitted for right total knee arthroplasty.  Subjective:  Chief Complaint:right knee pain.  HPI: James Doyle, 70 y.o. male, has a history of pain and functional disability in the right knee due to arthritis and has failed non-surgical conservative treatments for greater than 12 weeks to includeNSAID's and/or analgesics, corticosteriod injections, flexibility and strengthening excercises, weight reduction as appropriate and activity modification.  Onset of symptoms was gradual, starting 2 years ago with gradually worsening course since that time. The patient noted no past surgery on the right knee(s).  Patient currently rates pain in the right knee(s) at 7 out of 10 with activity. Patient has night pain, worsening of pain with activity and weight bearing, pain that interferes with activities of daily living, pain with passive range of motion, crepitus and joint swelling.  Patient has evidence of periarticular osteophytes and joint space narrowing by imaging studies.  There is no active infection.   Past Medical History:  Diagnosis Date  . Arthritis   . H/O vocal cord paralysis    left     Past Surgical History:  Procedure Laterality Date  . BACK SURGERY     times 2  . INNER EAR SURGERY     bilat  . PROSTATE BIOPSY    . TONSILLECTOMY      No Known Allergies  Social History  Substance Use Topics  . Smoking status: Never Smoker  . Smokeless tobacco: Never Used  . Alcohol use No      Review of Systems  Constitutional: Negative.   HENT: Negative.   Eyes: Negative.   Respiratory: Negative.   Cardiovascular: Negative.   Gastrointestinal: Negative.   Genitourinary: Negative.   Musculoskeletal: Positive for joint pain and myalgias. Negative for back pain, falls and neck pain.  Skin: Negative.   Neurological: Negative.   Endo/Heme/Allergies: Negative.   Psychiatric/Behavioral: Negative.     Objective:  Physical Exam   Constitutional: He is oriented to person, place, and time. He appears well-developed. No distress.  Obese  HENT:  Head: Normocephalic and atraumatic.  Right Ear: External ear normal.  Left Ear: External ear normal.  Nose: Nose normal.  Eyes: Conjunctivae and EOM are normal.  Neck: Normal range of motion. Neck supple.  Cardiovascular: Normal rate, regular rhythm, normal heart sounds and intact distal pulses.   No murmur heard. Respiratory: Effort normal and breath sounds normal. No respiratory distress. He has no wheezes.  GI: Soft. Bowel sounds are normal. He exhibits no distension. There is no tenderness.  Musculoskeletal:  His hips show normal motion with no discomfort. His left knee shows no effusion. Range of motion about 0 to 130 on the left with no tenderness or instability. Right knee, slight varus, range 5 to 125, marked crepitus on range of motion. Tenderness medial greater than lateral with no instability.  Neurological: He is alert and oriented to person, place, and time. He has normal strength. No sensory deficit.  Skin: No rash noted. He is not diaphoretic. No erythema.  Psychiatric: He has a normal mood and affect. His behavior is normal.    Vitals  Weight: 240 lb Height: 73in Body Surface Area: 2.33 m Body Mass Index: 31.66 kg/m  Pulse: 72 (Regular)  BP: 142/80 (Sitting, Left Arm, Standard)  Imaging Review Plain radiographs demonstrate severe degenerative joint disease of the right knee(s). The overall alignment ismild varus. The bone quality appears to be good for age and reported activity level.  Assessment/Plan:  End stage primary osteoarthritis, right knee   The patient history, physical examination, clinical judgment of the provider and imaging studies are consistent with end stage degenerative joint disease of the right knee(s) and total knee arthroplasty is deemed medically necessary. The treatment options including medical management, injection  therapy arthroscopy and arthroplasty were discussed at length. The risks and benefits of total knee arthroplasty were presented and reviewed. The risks due to aseptic loosening, infection, stiffness, patella tracking problems, thromboembolic complications and other imponderables were discussed. The patient acknowledged the explanation, agreed to proceed with the plan and consent was signed. Patient is being admitted for inpatient treatment for surgery, pain control, PT, OT, prophylactic antibiotics, VTE prophylaxis, progressive ambulation and ADL's and discharge planning. The patient is planning to be discharged home with home health services   PCP: [place] with wife Therapy Plans: direct outpatient therapy at [place], PA-C

## 2016-07-26 NOTE — Anesthesia Preprocedure Evaluation (Signed)
Anesthesia Evaluation  Patient identified by MRN, date of birth, ID band Patient awake    Reviewed: Allergy & Precautions, H&P , Patient's Chart, lab work & pertinent test results  Airway Mallampati: II  TM Distance: >3 FB Neck ROM: full    Dental no notable dental hx.    Pulmonary    Pulmonary exam normal breath sounds clear to auscultation       Cardiovascular Exercise Tolerance: Good  Rhythm:regular Rate:Normal     Neuro/Psych    GI/Hepatic   Endo/Other    Renal/GU      Musculoskeletal   Abdominal   Peds  Hematology   Anesthesia Other Findings   Reproductive/Obstetrics                             Anesthesia Physical Anesthesia Plan  ASA: II  Anesthesia Plan: Spinal   Post-op Pain Management:    Induction:   Airway Management Planned:   Additional Equipment:   Intra-op Plan:   Post-operative Plan:   Informed Consent: I have reviewed the patients History and Physical, chart, labs and discussed the procedure including the risks, benefits and alternatives for the proposed anesthesia with the patient or authorized representative who has indicated his/her understanding and acceptance.   Dental Advisory Given  Plan Discussed with: CRNA  Anesthesia Plan Comments: (Lab work confirmed with CRNA in room. Platelets okay. Discussed spinal anesthetic, and patient consents to the procedure:  included risk of possible headache,backache, failed block, allergic reaction, and nerve injury. This patient was asked if she had any questions or concerns before the procedure started. )        Anesthesia Quick Evaluation

## 2016-07-27 ENCOUNTER — Inpatient Hospital Stay (HOSPITAL_COMMUNITY): Payer: Medicare Other | Admitting: Anesthesiology

## 2016-07-27 ENCOUNTER — Encounter (HOSPITAL_COMMUNITY)
Admission: RE | Disposition: A | Discharge status: Home / Self Care | Payer: Self-pay | Source: Ambulatory Visit | Attending: Orthopedic Surgery

## 2016-07-27 ENCOUNTER — Encounter (HOSPITAL_COMMUNITY): Payer: Self-pay | Admitting: *Deleted

## 2016-07-27 ENCOUNTER — Inpatient Hospital Stay (HOSPITAL_COMMUNITY)
Admission: RE | Admit: 2016-07-27 | Discharge: 2016-07-29 | DRG: 470 | Disposition: A | Discharge status: Home / Self Care | Payer: Medicare Other | Source: Ambulatory Visit | Attending: Orthopedic Surgery | Admitting: Orthopedic Surgery

## 2016-07-27 DIAGNOSIS — E669 Obesity, unspecified: Secondary | ICD-10-CM | POA: Diagnosis present

## 2016-07-27 DIAGNOSIS — Z6831 Body mass index (BMI) 31.0-31.9, adult: Secondary | ICD-10-CM | POA: Diagnosis not present

## 2016-07-27 DIAGNOSIS — M1711 Unilateral primary osteoarthritis, right knee: Principal | ICD-10-CM | POA: Diagnosis present

## 2016-07-27 DIAGNOSIS — M1710 Unilateral primary osteoarthritis, unspecified knee: Secondary | ICD-10-CM | POA: Diagnosis present

## 2016-07-27 DIAGNOSIS — M179 Osteoarthritis of knee, unspecified: Secondary | ICD-10-CM | POA: Diagnosis present

## 2016-07-27 DIAGNOSIS — M25561 Pain in right knee: Secondary | ICD-10-CM | POA: Diagnosis present

## 2016-07-27 HISTORY — PX: TOTAL KNEE ARTHROPLASTY: SHX125

## 2016-07-27 LAB — TYPE AND SCREEN
ABO/RH(D): O POS
ANTIBODY SCREEN: NEGATIVE

## 2016-07-27 SURGERY — ARTHROPLASTY, KNEE, TOTAL
Anesthesia: Spinal | Site: Knee | Laterality: Right

## 2016-07-27 MED ORDER — MORPHINE SULFATE (PF) 2 MG/ML IV SOLN
1.0000 mg | INTRAVENOUS | Status: DC | PRN
  Administered 2016-07-27 – 2016-07-28 (×2): 1 mg via INTRAVENOUS
  Filled 2016-07-27 (×2): qty 1

## 2016-07-27 MED ORDER — BISACODYL 10 MG RE SUPP: 10.0000 mg | Freq: Every day | RECTAL | Status: DC | PRN

## 2016-07-27 MED ORDER — BUPIVACAINE LIPOSOME 1.3 % IJ SUSP
20.0000 mL | Freq: Once | INTRAMUSCULAR | Status: DC
  Filled 2016-07-27: qty 20

## 2016-07-27 MED ORDER — FENTANYL CITRATE (PF) 250 MCG/5ML IJ SOLN
INTRAMUSCULAR | Status: DC | PRN
  Administered 2016-07-27 (×4): 50 ug via INTRAVENOUS

## 2016-07-27 MED ORDER — ACETAMINOPHEN 10 MG/ML IV SOLN
INTRAVENOUS | Status: AC
  Filled 2016-07-27: qty 100

## 2016-07-27 MED ORDER — DEXAMETHASONE SODIUM PHOSPHATE 10 MG/ML IJ SOLN
10.0000 mg | Freq: Once | INTRAMUSCULAR | Status: AC
  Administered 2016-07-28: 10 mg via INTRAVENOUS
  Filled 2016-07-27: qty 1

## 2016-07-27 MED ORDER — PROPOFOL 500 MG/50ML IV EMUL
INTRAVENOUS | Status: DC | PRN
  Administered 2016-07-27: 25 ug/kg/min via INTRAVENOUS

## 2016-07-27 MED ORDER — CEFAZOLIN SODIUM-DEXTROSE 2-4 GM/100ML-% IV SOLN
2.0000 g | INTRAVENOUS | Status: AC
  Administered 2016-07-27: 2 g via INTRAVENOUS

## 2016-07-27 MED ORDER — EPHEDRINE SULFATE 50 MG/ML IJ SOLN
INTRAMUSCULAR | Status: DC | PRN
  Administered 2016-07-27 (×3): 10 mg via INTRAVENOUS

## 2016-07-27 MED ORDER — CEFAZOLIN SODIUM-DEXTROSE 2-4 GM/100ML-% IV SOLN
INTRAVENOUS | Status: AC
  Filled 2016-07-27: qty 100

## 2016-07-27 MED ORDER — SODIUM CHLORIDE 0.9 % IR SOLN
Status: DC | PRN
Start: 2016-07-27 — End: 2016-07-27
  Administered 2016-07-27: 1000 mL

## 2016-07-27 MED ORDER — PROPOFOL 10 MG/ML IV BOLUS
INTRAVENOUS | Status: DC | PRN
  Administered 2016-07-27 (×4): 20 mg via INTRAVENOUS

## 2016-07-27 MED ORDER — ACETAMINOPHEN 500 MG PO TABS
1000.0000 mg | ORAL_TABLET | Freq: Four times a day (QID) | ORAL | Status: AC
  Administered 2016-07-27 – 2016-07-28 (×3): 1000 mg via ORAL
  Filled 2016-07-27 (×3): qty 2

## 2016-07-27 MED ORDER — FENTANYL CITRATE (PF) 250 MCG/5ML IJ SOLN
INTRAMUSCULAR | Status: AC
  Filled 2016-07-27: qty 5

## 2016-07-27 MED ORDER — PROPOFOL 10 MG/ML IV BOLUS
INTRAVENOUS | Status: AC
  Filled 2016-07-27: qty 20

## 2016-07-27 MED ORDER — POLYETHYLENE GLYCOL 3350 17 G PO PACK
17.0000 g | PACK | Freq: Every day | ORAL | Status: DC | PRN
  Administered 2016-07-28: 17 g via ORAL
  Filled 2016-07-27: qty 1

## 2016-07-27 MED ORDER — PHENOL 1.4 % MT LIQD: 1.0000 | OROMUCOSAL | Status: DC | PRN

## 2016-07-27 MED ORDER — ACETAMINOPHEN 10 MG/ML IV SOLN
1000.0000 mg | Freq: Once | INTRAVENOUS | Status: AC
  Administered 2016-07-27: 1000 mg via INTRAVENOUS
  Filled 2016-07-27: qty 100

## 2016-07-27 MED ORDER — ACETAMINOPHEN 325 MG PO TABS: 650.0000 mg | ORAL_TABLET | Freq: Four times a day (QID) | ORAL | Status: DC | PRN

## 2016-07-27 MED ORDER — MIDAZOLAM HCL 2 MG/2ML IJ SOLN
INTRAMUSCULAR | Status: AC
  Filled 2016-07-27: qty 2

## 2016-07-27 MED ORDER — PHENYLEPHRINE HCL 10 MG/ML IJ SOLN
INTRAMUSCULAR | Status: DC | PRN
  Administered 2016-07-27: 40 ug via INTRAVENOUS
  Administered 2016-07-27 (×3): 80 ug via INTRAVENOUS
  Administered 2016-07-27 (×2): 40 ug via INTRAVENOUS
  Administered 2016-07-27 (×2): 80 ug via INTRAVENOUS
  Administered 2016-07-27 (×2): 40 ug via INTRAVENOUS

## 2016-07-27 MED ORDER — BUPIVACAINE LIPOSOME 1.3 % IJ SUSP
INTRAMUSCULAR | Status: DC | PRN
  Administered 2016-07-27: 20 mL

## 2016-07-27 MED ORDER — METOCLOPRAMIDE HCL 5 MG/ML IJ SOLN: 5.0000 mg | Freq: Three times a day (TID) | INTRAMUSCULAR | Status: DC | PRN

## 2016-07-27 MED ORDER — ONDANSETRON HCL 4 MG PO TABS: 4.0000 mg | ORAL_TABLET | Freq: Four times a day (QID) | ORAL | Status: DC | PRN

## 2016-07-27 MED ORDER — SODIUM CHLORIDE 0.9 % IJ SOLN
INTRAMUSCULAR | Status: DC | PRN
  Administered 2016-07-27: 30 mL

## 2016-07-27 MED ORDER — LACTATED RINGERS IV SOLN
INTRAVENOUS | Status: DC
  Administered 2016-07-27: 1000 mL via INTRAVENOUS
  Administered 2016-07-27: 08:00:00 via INTRAVENOUS

## 2016-07-27 MED ORDER — MIDAZOLAM HCL 2 MG/2ML IJ SOLN
INTRAMUSCULAR | Status: DC | PRN
  Administered 2016-07-27 (×2): 1 mg via INTRAVENOUS

## 2016-07-27 MED ORDER — ONDANSETRON HCL 4 MG/2ML IJ SOLN: 4.0000 mg | Freq: Four times a day (QID) | INTRAMUSCULAR | Status: DC | PRN

## 2016-07-27 MED ORDER — TRANEXAMIC ACID 1000 MG/10ML IV SOLN
1000.0000 mg | Freq: Once | INTRAVENOUS | Status: AC
  Administered 2016-07-27: 1000 mg via INTRAVENOUS
  Filled 2016-07-27 (×2): qty 10

## 2016-07-27 MED ORDER — ACETAMINOPHEN 500 MG PO TABS: 1000.0000 mg | ORAL_TABLET | Freq: Four times a day (QID) | ORAL | Status: DC

## 2016-07-27 MED ORDER — CEFAZOLIN SODIUM-DEXTROSE 2-4 GM/100ML-% IV SOLN
2.0000 g | Freq: Four times a day (QID) | INTRAVENOUS | Status: AC
  Administered 2016-07-27 (×2): 2 g via INTRAVENOUS
  Filled 2016-07-27 (×2): qty 100

## 2016-07-27 MED ORDER — SODIUM CHLORIDE 0.9 % IV SOLN
INTRAVENOUS | Status: DC
  Administered 2016-07-27: 11:00:00 via INTRAVENOUS

## 2016-07-27 MED ORDER — ONDANSETRON HCL 4 MG/2ML IJ SOLN
INTRAMUSCULAR | Status: DC | PRN
  Administered 2016-07-27: 4 mg via INTRAVENOUS

## 2016-07-27 MED ORDER — FENTANYL CITRATE (PF) 100 MCG/2ML IJ SOLN: 25.0000 ug | INTRAMUSCULAR | Status: DC | PRN

## 2016-07-27 MED ORDER — DIPHENHYDRAMINE HCL 12.5 MG/5ML PO ELIX
12.5000 mg | ORAL_SOLUTION | ORAL | Status: DC | PRN
  Administered 2016-07-28: 12.5 mg via ORAL
  Administered 2016-07-28 – 2016-07-29 (×3): 25 mg via ORAL
  Filled 2016-07-27: qty 5
  Filled 2016-07-27 (×2): qty 10
  Filled 2016-07-27 (×2): qty 5

## 2016-07-27 MED ORDER — BUPIVACAINE HCL (PF) 0.25 % IJ SOLN
INTRAMUSCULAR | Status: AC
  Filled 2016-07-27: qty 30

## 2016-07-27 MED ORDER — RIVAROXABAN 10 MG PO TABS
10.0000 mg | ORAL_TABLET | Freq: Every day | ORAL | Status: DC
  Administered 2016-07-28 – 2016-07-29 (×2): 10 mg via ORAL
  Filled 2016-07-27 (×2): qty 1

## 2016-07-27 MED ORDER — FLEET ENEMA 7-19 GM/118ML RE ENEM: 1.0000 | ENEMA | Freq: Once | RECTAL | Status: DC | PRN

## 2016-07-27 MED ORDER — BUPIVACAINE HCL 0.25 % IJ SOLN
INTRAMUSCULAR | Status: DC | PRN
  Administered 2016-07-27: 20 mL

## 2016-07-27 MED ORDER — LIDOCAINE HCL (CARDIAC) 20 MG/ML IV SOLN
INTRAVENOUS | Status: DC | PRN
  Administered 2016-07-27: 20 mg via INTRAVENOUS

## 2016-07-27 MED ORDER — DOCUSATE SODIUM 100 MG PO CAPS
100.0000 mg | ORAL_CAPSULE | Freq: Two times a day (BID) | ORAL | Status: DC
Start: 2016-07-27 — End: 2016-07-29
  Administered 2016-07-28 – 2016-07-29 (×3): 100 mg via ORAL
  Filled 2016-07-27 (×3): qty 1

## 2016-07-27 MED ORDER — TRANEXAMIC ACID 1000 MG/10ML IV SOLN
1000.0000 mg | INTRAVENOUS | Status: AC
  Administered 2016-07-27: 1000 mg via INTRAVENOUS
  Filled 2016-07-27: qty 1100

## 2016-07-27 MED ORDER — METOCLOPRAMIDE HCL 5 MG PO TABS: 5.0000 mg | ORAL_TABLET | Freq: Three times a day (TID) | ORAL | Status: DC | PRN

## 2016-07-27 MED ORDER — DEXAMETHASONE SODIUM PHOSPHATE 10 MG/ML IJ SOLN
10.0000 mg | Freq: Once | INTRAMUSCULAR | Status: AC
  Administered 2016-07-27: 10 mg via INTRAVENOUS

## 2016-07-27 MED ORDER — ACETAMINOPHEN 650 MG RE SUPP: 650.0000 mg | Freq: Four times a day (QID) | RECTAL | Status: DC | PRN

## 2016-07-27 MED ORDER — METHOCARBAMOL 1000 MG/10ML IJ SOLN
500.0000 mg | Freq: Four times a day (QID) | INTRAVENOUS | Status: DC | PRN
  Administered 2016-07-27: 500 mg via INTRAVENOUS
  Filled 2016-07-27: qty 550
  Filled 2016-07-27: qty 5

## 2016-07-27 MED ORDER — METHOCARBAMOL 500 MG PO TABS
500.0000 mg | ORAL_TABLET | Freq: Four times a day (QID) | ORAL | Status: DC | PRN
  Administered 2016-07-27 – 2016-07-29 (×4): 500 mg via ORAL
  Filled 2016-07-27 (×4): qty 1

## 2016-07-27 MED ORDER — CHLORHEXIDINE GLUCONATE 4 % EX LIQD: 60.0000 mL | Freq: Once | CUTANEOUS | Status: DC

## 2016-07-27 MED ORDER — SODIUM CHLORIDE 0.9 % IJ SOLN
INTRAMUSCULAR | Status: AC
  Filled 2016-07-27: qty 50

## 2016-07-27 MED ORDER — BUPIVACAINE IN DEXTROSE 0.75-8.25 % IT SOLN
INTRATHECAL | Status: DC | PRN
  Administered 2016-07-27: 2 mL via INTRATHECAL

## 2016-07-27 MED ORDER — OXYCODONE HCL 5 MG PO TABS
5.0000 mg | ORAL_TABLET | ORAL | Status: DC | PRN
  Administered 2016-07-27 (×2): 10 mg via ORAL
  Administered 2016-07-27: 5 mg via ORAL
  Administered 2016-07-28 – 2016-07-29 (×8): 10 mg via ORAL
  Filled 2016-07-27 (×5): qty 2
  Filled 2016-07-27: qty 1
  Filled 2016-07-27 (×5): qty 2

## 2016-07-27 MED ORDER — MENTHOL 3 MG MT LOZG: 1.0000 | LOZENGE | OROMUCOSAL | Status: DC | PRN

## 2016-07-27 SURGICAL SUPPLY — 51 items
BAG DECANTER FOR FLEXI CONT (MISCELLANEOUS) ×2 IMPLANT
BAG ZIPLOCK 12X15 (MISCELLANEOUS) ×2 IMPLANT
BANDAGE ACE 6X5 VEL STRL LF (GAUZE/BANDAGES/DRESSINGS) IMPLANT
BANDAGE ELASTIC 6 VELCRO ST LF (GAUZE/BANDAGES/DRESSINGS) ×2 IMPLANT
BLADE SAG 18X100X1.27 (BLADE) ×2 IMPLANT
BLADE SAW SGTL 11.0X1.19X90.0M (BLADE) ×2 IMPLANT
BOWL SMART MIX CTS (DISPOSABLE) ×2 IMPLANT
CAP KNEE TOTAL 3 SIGMA ×2 IMPLANT
CEMENT HV SMART SET (Cement) ×4 IMPLANT
CLOTH BEACON ORANGE TIMEOUT ST (SAFETY) ×2 IMPLANT
CUFF TOURN SGL QUICK 34 (TOURNIQUET CUFF) ×1
CUFF TRNQT CYL 34X4X40X1 (TOURNIQUET CUFF) ×1 IMPLANT
DECANTER SPIKE VIAL GLASS SM (MISCELLANEOUS) ×2 IMPLANT
DRAPE U-SHAPE 47X51 STRL (DRAPES) ×2 IMPLANT
DRSG ADAPTIC 3X8 NADH LF (GAUZE/BANDAGES/DRESSINGS) ×2 IMPLANT
DRSG PAD ABDOMINAL 8X10 ST (GAUZE/BANDAGES/DRESSINGS) IMPLANT
DURAPREP 26ML APPLICATOR (WOUND CARE) ×2 IMPLANT
ELECT REM PT RETURN 9FT ADLT (ELECTROSURGICAL) ×2
ELECTRODE REM PT RTRN 9FT ADLT (ELECTROSURGICAL) ×1 IMPLANT
EVACUATOR 1/8 PVC DRAIN (DRAIN) ×2 IMPLANT
GAUZE SPONGE 4X4 12PLY STRL (GAUZE/BANDAGES/DRESSINGS) ×2 IMPLANT
GLOVE BIO SURGEON STRL SZ7.5 (GLOVE) IMPLANT
GLOVE BIO SURGEON STRL SZ8 (GLOVE) ×2 IMPLANT
GLOVE BIOGEL PI IND STRL 6.5 (GLOVE) IMPLANT
GLOVE BIOGEL PI IND STRL 8 (GLOVE) ×1 IMPLANT
GLOVE BIOGEL PI INDICATOR 6.5 (GLOVE)
GLOVE BIOGEL PI INDICATOR 8 (GLOVE) ×1
GLOVE SURG SS PI 6.5 STRL IVOR (GLOVE) IMPLANT
GOWN STRL REUS W/TWL LRG LVL3 (GOWN DISPOSABLE) ×2 IMPLANT
GOWN STRL REUS W/TWL XL LVL3 (GOWN DISPOSABLE) IMPLANT
HANDPIECE INTERPULSE COAX TIP (DISPOSABLE) ×1
IMMOBILIZER KNEE 20 (SOFTGOODS) ×2
IMMOBILIZER KNEE 20 THIGH 36 (SOFTGOODS) ×1 IMPLANT
MANIFOLD NEPTUNE II (INSTRUMENTS) ×2 IMPLANT
NS IRRIG 1000ML POUR BTL (IV SOLUTION) ×2 IMPLANT
PACK TOTAL KNEE CUSTOM (KITS) ×2 IMPLANT
PAD ABD 8X10 STRL (GAUZE/BANDAGES/DRESSINGS) ×2 IMPLANT
PADDING CAST COTTON 6X4 STRL (CAST SUPPLIES) ×2 IMPLANT
POSITIONER SURGICAL ARM (MISCELLANEOUS) ×2 IMPLANT
SET HNDPC FAN SPRY TIP SCT (DISPOSABLE) ×1 IMPLANT
STRIP CLOSURE SKIN 1/2X4 (GAUZE/BANDAGES/DRESSINGS) ×2 IMPLANT
SUT MNCRL AB 4-0 PS2 18 (SUTURE) ×2 IMPLANT
SUT VIC AB 2-0 CT1 27 (SUTURE) ×3
SUT VIC AB 2-0 CT1 TAPERPNT 27 (SUTURE) ×3 IMPLANT
SUT VLOC 180 0 24IN GS25 (SUTURE) ×2 IMPLANT
SYR 50ML LL SCALE MARK (SYRINGE) ×2 IMPLANT
TRAY FOLEY W/METER SILVER 14FR (SET/KITS/TRAYS/PACK) IMPLANT
TRAY FOLEY W/METER SILVER 16FR (SET/KITS/TRAYS/PACK) ×2 IMPLANT
WATER STERILE IRR 1500ML POUR (IV SOLUTION) ×2 IMPLANT
WRAP KNEE MAXI GEL POST OP (GAUZE/BANDAGES/DRESSINGS) ×2 IMPLANT
YANKAUER SUCT BULB TIP 10FT TU (MISCELLANEOUS) ×2 IMPLANT

## 2016-07-27 NOTE — Op Note (Signed)
OPERATIVE REPORT-TOTAL KNEE ARTHROPLASTY   Pre-operative diagnosis- Osteoarthritis  Right knee(s)  Post-operative diagnosis- Osteoarthritis Right knee(s)  Procedure-  Right  Total Knee Arthroplasty  Surgeon- Dione Plover. Shalane Florendo, MD  Assistant- Arlee Muslim, PA-C   Anesthesia-  Spinal  EBL-* No blood loss amount entered *   Drains Hemovac  Tourniquet time-  Total Tourniquet Time Documented: Thigh (Right) - 36 minutes Total: Thigh (Right) - 36 minutes     Complications- None  Condition-PACU - hemodynamically stable.   Brief Clinical Note  James Doyle is a 70 y.o. year old male with end stage OA of his right knee with progressively worsening pain and dysfunction. He has constant pain, with activity and at rest and significant functional deficits with difficulties even with ADLs. He has had extensive non-op management including analgesics, injections of cortisone, and home exercise program, but remains in significant pain with significant dysfunction. Radiographs show bone on bone arthritis medial and patellofemoral. He presents now for right Total Knee Arthroplasty.    Procedure in detail---   The patient is brought into the operating room and positioned supine on the operating table. After successful administration of  Spinal,   a tourniquet is placed high on the  Right thigh(s) and the lower extremity is prepped and draped in the usual sterile fashion. Time out is performed by the operating team and then the  Right lower extremity is wrapped in Esmarch, knee flexed and the tourniquet inflated to 300 mmHg.       A midline incision is made with a ten blade through the subcutaneous tissue to the level of the extensor mechanism. A fresh blade is used to make a medial parapatellar arthrotomy. Soft tissue over the proximal medial tibia is subperiosteally elevated to the joint line with a knife and into the semimembranosus bursa with a Cobb elevator. Soft tissue over the proximal  lateral tibia is elevated with attention being paid to avoiding the patellar tendon on the tibial tubercle. The patella is everted, knee flexed 90 degrees and the ACL and PCL are removed. Findings are bone on bone medial and patellofemoral with large global osteophytes.        The drill is used to create a starting hole in the distal femur and the canal is thoroughly irrigated with sterile saline to remove the fatty contents. The 5 degree Right  valgus alignment guide is placed into the femoral canal and the distal femoral cutting block is pinned to remove 11 mm off the distal femur. Resection is made with an oscillating saw.      The tibia is subluxed forward and the menisci are removed. The extramedullary alignment guide is placed referencing proximally at the medial aspect of the tibial tubercle and distally along the second metatarsal axis and tibial crest. The block is pinned to remove 5mm off the more deficient medial  side. Resection is made with an oscillating saw. Size 5is the most appropriate size for the tibia and the proximal tibia is prepared with the modular drill and keel punch for that size.      The femoral sizing guide is placed and size 6 is most appropriate. Rotation is marked off the epicondylar axis and confirmed by creating a rectangular flexion gap at 90 degrees. The size 6 cutting block is pinned in this rotation and the anterior, posterior and chamfer cuts are made with the oscillating saw. The intercondylar block is then placed and that cut is made.      Trial  size 5 tibial component, trial size 6 posterior stabilized femur and a 10  mm posterior stabilized rotating platform insert trial is placed. Full extension is achieved with excellent varus/valgus and anterior/posterior balance throughout full range of motion. The patella is everted and thickness measured to be 30  mm. Free hand resection is taken to 17 mm, a 41 template is placed, lug holes are drilled, trial patella is placed,  and it tracks normally. Osteophytes are removed off the posterior femur with the trial in place. All trials are removed and the cut bone surfaces prepared with pulsatile lavage. Cement is mixed and once ready for implantation, the size 5 tibial implant, size  6 posterior stabilized femoral component, and the size 41 patella are cemented in place and the patella is held with the clamp. The trial insert is placed and the knee held in full extension. The Exparel (20 ml mixed with 30 ml saline) and .25% Bupivicaine, are injected into the extensor mechanism, posterior capsule, medial and lateral gutters and subcutaneous tissues.  All extruded cement is removed and once the cement is hard the permanent 10 mm posterior stabilized rotating platform insert is placed into the tibial tray.      The wound is copiously irrigated with saline solution and the extensor mechanism closed over a hemovac drain with #1 V-loc suture. The tourniquet is released for a total tourniquet time of 36  minutes. Flexion against gravity is 140 degrees and the patella tracks normally. Subcutaneous tissue is closed with 2.0 vicryl and subcuticular with running 4.0 Monocryl. The incision is cleaned and dried and steri-strips and a bulky sterile dressing are applied. The limb is placed into a knee immobilizer and the patient is awakened and transported to recovery in stable condition.      Please note that a surgical assistant was a medical necessity for this procedure in order to perform it in a safe and expeditious manner. Surgical assistant was necessary to retract the ligaments and vital neurovascular structures to prevent injury to them and also necessary for proper positioning of the limb to allow for anatomic placement of the prosthesis.   Dione Plover Nohemy Koop, MD    07/27/2016, 9:14 AM

## 2016-07-27 NOTE — Transfer of Care (Signed)
Immediate Anesthesia Transfer of Care Note  Patient: [name]  Procedure(s) Performed: Procedure(s): RIGHT TOTAL KNEE ARTHROPLASTY (Right)  Patient Location: PACU  Anesthesia Type:Spinal  Level of Consciousness: awake and alert   Airway & Oxygen Therapy: Patient Spontanous Breathing and Patient connected to face mask oxygen  Post-op Assessment: Report given to RN and Post -op Vital signs reviewed and stable  Post vital signs: Reviewed and stable  Last Vitals:  Vitals:   07/27/16 0613  BP: (!) 162/92  Pulse: 69  Resp: 18  Temp: 36.7 C    Last Pain:  Vitals:   07/27/16 0613  TempSrc: Oral      Patients Stated Pain Goal: 4 ([id number])  Complications: No apparent anesthesia complications

## 2016-07-27 NOTE — Anesthesia Postprocedure Evaluation (Signed)
Anesthesia Post Note  Patient: James Doyle  Procedure(s) Performed: Procedure(s) (LRB): RIGHT TOTAL KNEE ARTHROPLASTY (Right)  Patient location during evaluation: PACU Anesthesia Type: Spinal Level of consciousness: awake Pain management: satisfactory to patient Vital Signs Assessment: post-procedure vital signs reviewed and stable Respiratory status: spontaneous breathing Cardiovascular status: blood pressure returned to baseline Postop Assessment: no headache and spinal receding Anesthetic complications: no    Last Vitals:  Vitals:   07/27/16 1404 07/27/16 1700  BP: 132/77 125/74  Pulse: 70 74  Resp: 16 20  Temp: 36.4 C 36.8 C    Last Pain:  Vitals:   07/27/16 1700  TempSrc: Oral  PainSc:                  Riccardo Dubin

## 2016-07-27 NOTE — Anesthesia Procedure Notes (Signed)
Spinal  Patient location during procedure: OR End time: 07/27/2016 8:21 AM Staffing Resident/CRNA: Cynda Familia Performed: resident/CRNA  Preanesthetic Checklist Completed: patient identified, site marked, surgical consent, pre-op evaluation, timeout performed, IV checked, risks and benefits discussed and monitors and equipment checked Spinal Block Patient position: sitting Prep: ChloraPrep and site prepped and draped Patient monitoring: heart rate, continuous pulse ox, blood pressure and cardiac monitor Approach: midline Location: L3-4 Injection technique: single-shot Needle Needle type: Sprotte  Needle gauge: 24 G Assessment Sensory level: T6 Additional Notes Expiration date of tray noted and within date.   Patient tolerated procedure well. Jackson supervised spinal placement

## 2016-07-27 NOTE — Interval H&P Note (Signed)
History and Physical Interval Note:  07/27/2016 6:46 AM  James Doyle  has presented today for surgery, with the diagnosis of right knee osteoarthritis  The various methods of treatment have been discussed with the patient and family. After consideration of risks, benefits and other options for treatment, the patient has consented to  Procedure(s): RIGHT TOTAL KNEE ARTHROPLASTY (Right) as a surgical intervention .  The patient's history has been reviewed, patient examined, no change in status, stable for surgery.  I have reviewed the patient's chart and labs.  Questions were answered to the patient's satisfaction.     [name]

## 2016-07-27 NOTE — Evaluation (Signed)
Physical Therapy Evaluation Patient Details Name: James Doyle MRN: [id number] DOB: 07-Nov-1946 Today's Date: 07/27/2016   History of Present Illness  RTKA  Clinical Impression  The patient is mobilizing ell. Plans to DC to home of daughter. Pt admitted with above diagnosis. Pt currently with functional limitations due to the deficits listed below (see PT Problem List).  Pt will benefit from skilled PT to increase their independence and safety with mobility to allow discharge to the venue listed below.       Follow Up Recommendations Home health PT;Supervision/Assistance - 24 hour    Equipment Recommendations  Rolling walker with 5" wheels    Recommendations for Other Services       Precautions / Restrictions Precautions Precautions: Knee Required Braces or Orthoses: Knee Immobilizer - Right Knee Immobilizer - Right: Discontinue once straight leg raise with < 10 degree lag      Mobility  Bed Mobility Overal bed mobility: Needs Assistance Bed Mobility: Supine to Sit     Supine to sit: Min assist     General bed mobility comments: support right leg, cues for technique  Transfers Overall transfer level: Needs assistance Equipment used: Rolling walker (2 wheeled) Transfers: Sit to/from Stand Sit to Stand: Min assist         General transfer comment: cues for hand and R leg position. Easily distracted due to his enjoying joking a lot.  Ambulation/Gait Ambulation/Gait assistance: Min assist Ambulation Distance (Feet): 110 Feet Assistive device: Rolling walker (2 wheeled) Gait Pattern/deviations: Step-to pattern;Decreased step length - right;Decreased stance time - right     General Gait Details: cues for sequence and posture  Stairs            Wheelchair Mobility    Modified Rankin (Stroke Patients Only)       Balance                                             Pertinent Vitals/Pain Pain Assessment: 0-10 Pain Score: 5  Pain  Location: R knee Pain Descriptors / Indicators: Tightness;Discomfort Pain Intervention(s): Monitored during session;Premedicated before session;Ice applied    Home Living Family/patient expects to be discharged to:: Private residence Living Arrangements: Spouse/significant other;Children Available Help at Discharge: Family Type of Home: House Home Access: Level entry     Home Layout: One level Home Equipment: Kasandra Knudsen - single point Additional Comments: plans to go to daughter's house    Prior Function Level of Independence: Independent               Hand Dominance        Extremity/Trunk Assessment   Upper Extremity Assessment: Defer to OT evaluation           Lower Extremity Assessment: RLE deficits/detail RLE Deficits / Details: performed SLR with lag    Cervical / Trunk Assessment: Kyphotic  Communication   Communication: No difficulties  Cognition Arousal/Alertness: Awake/alert Behavior During Therapy: WFL for tasks assessed/performed Overall Cognitive Status: Within Functional Limits for tasks assessed                      General Comments      Exercises        Assessment/Plan    PT Assessment Patient needs continued PT services  PT Diagnosis Difficulty walking;Acute pain   PT Problem List Decreased strength;Decreased range of motion;Decreased activity  tolerance;Decreased mobility;Decreased knowledge of use of DME;Decreased safety awareness;Decreased knowledge of precautions;Pain  PT Treatment Interventions DME instruction;Gait training;Functional mobility training;Therapeutic activities;Patient/family education   PT Goals (Current goals can be found in the Care Plan section) Acute Rehab PT Goals Patient Stated Goal: to go home PT Goal Formulation: With patient/family Time For Goal Achievement: 07/30/16 Potential to Achieve Goals: Good    Frequency 7X/week   Barriers to discharge        Co-evaluation               End of  Session Equipment Utilized During Treatment: Gait belt;Right knee immobilizer Activity Tolerance: Patient tolerated treatment well Patient left: in chair;with call bell/phone within reach;with family/visitor present Nurse Communication: Mobility status         Time: [id number] PT Time Calculation (min) (ACUTE ONLY): 31 min   Charges:   PT Evaluation $PT Eval Low Complexity: 1 Procedure PT Treatments $Gait Training: 8-22 mins   PT G Codes:        James Doyle 07/27/2016, 5:43 PM Tresa Endo PT [phone]

## 2016-07-28 LAB — CBC
HEMATOCRIT: 33.8 % — AB (ref 39.0–52.0)
HEMOGLOBIN: 11.3 g/dL — AB (ref 13.0–17.0)
MCH: 29.7 pg (ref 26.0–34.0)
MCHC: 33.4 g/dL (ref 30.0–36.0)
MCV: 88.9 fL (ref 78.0–100.0)
Platelets: 173 10*3/uL (ref 150–400)
RBC: 3.8 MIL/uL — AB (ref 4.22–5.81)
RDW: 13.1 % (ref 11.5–15.5)
WBC: 22.2 10*3/uL — ABNORMAL HIGH (ref 4.0–10.5)

## 2016-07-28 LAB — BASIC METABOLIC PANEL
ANION GAP: 6 (ref 5–15)
BUN: 12 mg/dL (ref 6–20)
CHLORIDE: 106 mmol/L (ref 101–111)
CO2: 25 mmol/L (ref 22–32)
Calcium: 8.6 mg/dL — ABNORMAL LOW (ref 8.9–10.3)
Creatinine, Ser: 1.06 mg/dL (ref 0.61–1.24)
GFR calc non Af Amer: >60 mL/min (ref >60)
Glucose, Bld: 145 mg/dL — ABNORMAL HIGH (ref 65–99)
POTASSIUM: 4.1 mmol/L (ref 3.5–5.1)
SODIUM: 137 mmol/L (ref 135–145)

## 2016-07-28 MED ORDER — RIVAROXABAN 10 MG PO TABS: 10.0000 mg | ORAL_TABLET | Freq: Every day | ORAL | 0 refills | Status: DC

## 2016-07-28 MED ORDER — METHOCARBAMOL 500 MG PO TABS: 500.0000 mg | ORAL_TABLET | Freq: Four times a day (QID) | ORAL | 0 refills | Status: DC | PRN

## 2016-07-28 MED ORDER — OXYCODONE HCL 5 MG PO TABS: 5.0000 mg | ORAL_TABLET | ORAL | 0 refills | Status: DC | PRN

## 2016-07-28 NOTE — Discharge Instructions (Addendum)
° °Dr. Frank Aluisio °Total Joint Specialist °[facility] °[address] °[place], [place] [address] °[phone] ° °TOTAL KNEE REPLACEMENT POSTOPERATIVE DIRECTIONS ° °Knee Rehabilitation, Guidelines Following Surgery  °Results after knee surgery are often greatly improved when you follow the exercise, range of motion and muscle strengthening exercises prescribed by your doctor. Safety measures are also important to protect the knee from further injury. Any time any of these exercises cause you to have increased pain or swelling in your knee joint, decrease the amount until you are comfortable again and slowly increase them. If you have problems or questions, call your caregiver or physical therapist for advice.  ° °HOME CARE INSTRUCTIONS  °Remove items at home which could result in a fall. This includes throw rugs or furniture in walking pathways.  °· ICE to the affected knee every three hours for 30 minutes at a time and then as needed for pain and swelling.  Continue to use ice on the knee for pain and swelling from surgery. You may notice swelling that will progress down to the foot and ankle.  This is normal after surgery.  Elevate the leg when you are not up walking on it.   °· Continue to use the breathing machine which will help keep your temperature down.  It is common for your temperature to cycle up and down following surgery, especially at night when you are not up moving around and exerting yourself.  The breathing machine keeps your lungs expanded and your temperature down. °· Do not place pillow under knee, focus on keeping the knee straight while resting ° °DIET °You may resume your previous home diet once your are discharged from the hospital. ° °DRESSING / WOUND CARE / SHOWERING °You may shower 3 days after surgery, but keep the wounds dry during showering.  You may use an occlusive plastic wrap (Press'n Seal for example), NO SOAKING/SUBMERGING IN THE BATHTUB.  If the  bandage gets wet, change with a clean dry gauze.  If the incision gets wet, pat the wound dry with a clean towel. °You may start showering once you are discharged home but do not submerge the incision under water. Just pat the incision dry and apply a dry gauze dressing on daily. °Change the surgical dressing daily and reapply a dry dressing each time. ° °ACTIVITY °Walk with your walker as instructed. °Use walker as long as suggested by your caregivers. °Avoid periods of inactivity such as sitting longer than an hour when not asleep. This helps prevent blood clots.  °You may resume a sexual relationship in one month or when given the OK by your doctor.  °You may return to work once you are cleared by your doctor.  °Do not drive a car for 6 weeks or until released by you surgeon.  °Do not drive while taking narcotics. ° °WEIGHT BEARING °Weight bearing as tolerated with assist device (walker, cane, etc) as directed, use it as long as suggested by your surgeon or therapist, typically at least 4-6 weeks. ° °POSTOPERATIVE CONSTIPATION PROTOCOL °Constipation - defined medically as fewer than three stools per week and severe constipation as less than one stool per week. ° °One of the most common issues patients have following surgery is constipation.  Even if you have a regular bowel pattern at home, your normal regimen is likely to be disrupted due to multiple reasons following surgery.  Combination of anesthesia, postoperative narcotics, change in appetite and fluid intake all can affect your bowels.    In order to avoid complications following surgery, here are some recommendations in order to help you during your recovery period. ° °Colace (docusate) - Pick up an over-the-counter form of Colace or another stool softener and take twice a day as long as you are requiring postoperative pain medications.  Take with a full glass of water daily.  If you experience loose stools or diarrhea, hold the colace until you stool forms  back up.  If your symptoms do not get better within 1 week or if they get worse, check with your doctor. ° °Dulcolax (bisacodyl) - Pick up over-the-counter and take as directed by the product packaging as needed to assist with the movement of your bowels.  Take with a full glass of water.  Use this product as needed if not relieved by Colace only.  ° °MiraLax (polyethylene glycol) - Pick up over-the-counter to have on hand.  MiraLax is a solution that will increase the amount of water in your bowels to assist with bowel movements.  Take as directed and can mix with a glass of water, juice, soda, coffee, or tea.  Take if you go more than two days without a movement. °Do not use MiraLax more than once per day. Call your doctor if you are still constipated or irregular after using this medication for 7 days in a row. ° °If you continue to have problems with postoperative constipation, please contact the office for further assistance and recommendations.  If you experience "the worst abdominal pain ever" or develop nausea or vomiting, please contact the office immediatly for further recommendations for treatment. ° °ITCHING ° If you experience itching with your medications, try taking only a single pain pill, or even half a pain pill at a time.  You can also use Benadryl over the counter for itching or also to help with sleep.  ° °TED HOSE STOCKINGS °Wear the elastic stockings on both legs for three weeks following surgery during the day but you may remove then at night for sleeping. ° °MEDICATIONS °See your medication summary on the “After Visit Summary” that the nursing staff will review with you prior to discharge.  You may have some home medications which will be placed on hold until you complete the course of blood thinner medication.  It is important for you to complete the blood thinner medication as prescribed by your surgeon.  Continue your approved medications as instructed at time of  discharge. ° °PRECAUTIONS °If you experience chest pain or shortness of breath - call 911 immediately for transfer to the hospital emergency department.  °If you develop a fever greater that 101 F, purulent drainage from wound, increased redness or drainage from wound, foul odor from the wound/dressing, or calf pain - CONTACT YOUR SURGEON.   °                                                °FOLLOW-UP APPOINTMENTS °Make sure you keep all of your appointments after your operation with your surgeon and caregivers. You should call the office at the above phone number and make an appointment for approximately two weeks after the date of your surgery or on the date instructed by your surgeon outlined in the "After Visit Summary". ° ° °RANGE OF MOTION AND STRENGTHENING EXERCISES  °Rehabilitation of the knee is important following a knee injury or   an operation. After just a few days of immobilization, the muscles of the thigh which control the knee become weakened and shrink (atrophy). Knee exercises are designed to build up the tone and strength of the thigh muscles and to improve knee motion. Often times heat used for twenty to thirty minutes before working out will loosen up your tissues and help with improving the range of motion but do not use heat for the first two weeks following surgery. These exercises can be done on a training (exercise) mat, on the floor, on a table or on a bed. Use what ever works the best and is most comfortable for you Knee exercises include:  °Leg Lifts - While your knee is still immobilized in a splint or cast, you can do straight leg raises. Lift the leg to 60 degrees, hold for 3 sec, and slowly lower the leg. Repeat 10-20 times 2-3 times daily. Perform this exercise against resistance later as your knee gets better.  °Quad and Hamstring Sets - Tighten up the muscle on the front of the thigh (Quad) and hold for 5-10 sec. Repeat this 10-20 times hourly. Hamstring sets are done by pushing the  foot backward against an object and holding for 5-10 sec. Repeat as with quad sets.  °· Leg Slides: Lying on your back, slowly slide your foot toward your buttocks, bending your knee up off the floor (only go as far as is comfortable). Then slowly slide your foot back down until your leg is flat on the floor again. °· Angel Wings: Lying on your back spread your legs to the side as far apart as you can without causing discomfort.  °A rehabilitation program following serious knee injuries can speed recovery and prevent re-injury in the future due to weakened muscles. Contact your doctor or a physical therapist for more information on knee rehabilitation.  ° °IF YOU ARE TRANSFERRED TO A SKILLED REHAB FACILITY °If the patient is transferred to a skilled rehab facility following release from the hospital, a list of the current medications will be sent to the facility for the patient to continue.  When discharged from the skilled rehab facility, please have the facility set up the patient's Home Health Physical Therapy prior to being released. Also, the skilled facility will be responsible for providing the patient with their medications at time of release from the facility to include their pain medication, the muscle relaxants, and their blood thinner medication. If the patient is still at the rehab facility at time of the two week follow up appointment, the skilled rehab facility will also need to assist the patient in arranging follow up appointment in our office and any transportation needs. ° °MAKE SURE YOU:  °Understand these instructions.  °Get help right away if you are not doing well or get worse.  ° ° °Pick up stool softner and laxative for home use following surgery while on pain medications. °Do not submerge incision under water. °Please use good hand washing techniques while changing dressing each day. °May shower starting three days after surgery. °Please use a clean towel to pat the incision dry following  showers. °Continue to use ice for pain and swelling after surgery. °Do not use any lotions or creams on the incision until instructed by your surgeon. ° °Take Xarelto for two and a half more weeks, then discontinue Xarelto. °Once the patient has completed the blood thinner regimen, then take a Baby 81 mg Aspirin daily for three more weeks. ° ° °Information   on my medicine - XARELTO (Rivaroxaban)  This medication education was reviewed with me or my healthcare representative as part of my discharge preparation.  The pharmacist that spoke with me during my hospital stay was:  Laqueta Jean  Why was Xarelto prescribed for you? Xarelto was prescribed for you to reduce the risk of blood clots forming after orthopedic surgery. The medical term for these abnormal blood clots is venous thromboembolism (VTE).  What do you need to know about xarelto ? Take your Xarelto ONCE DAILY at the same time every day. You may take it either with or without food.  If you have difficulty swallowing the tablet whole, you may crush it and mix in applesauce just prior to taking your dose.  Take Xarelto exactly as prescribed by your doctor and DO NOT stop taking Xarelto without talking to the doctor who prescribed the medication.  Stopping without other VTE prevention medication to take the place of Xarelto may increase your risk of developing a clot.  After discharge, you should have regular check-up appointments with your healthcare provider that is prescribing your Xarelto.    What do you do if you miss a dose? If you miss a dose, take it as soon as you remember on the same day then continue your regularly scheduled once daily regimen the next day. Do not take two doses of Xarelto on the same day.   Important Safety Information A possible side effect of Xarelto is bleeding. You should call your healthcare provider right away if you experience any of the following: ? Bleeding from an injury or your nose that  does not stop. ? Unusual colored urine (red or dark brown) or unusual colored stools (red or black). ? Unusual bruising for unknown reasons. ? A serious fall or if you hit your head (even if there is no bleeding).  Some medicines may interact with Xarelto and might increase your risk of bleeding while on Xarelto. To help avoid this, consult your healthcare provider or pharmacist prior to using any new prescription or non-prescription medications, including herbals, vitamins, non-steroidal anti-inflammatory drugs (NSAIDs) and supplements.  This website has more information on Xarelto: [email or web address]

## 2016-07-28 NOTE — Progress Notes (Signed)
Physical Therapy Treatment Patient Details Name: GEOVANIE UMBAUGH MRN: [id number] DOB: 1946/08/04 Today's Date: 07/28/2016    History of Present Illness RTKA    PT Comments    POD # 1 am session.  Assisted OOB to amb in hallway then perform TKR TE's followed by ICE.     Follow Up Recommendations  Home health PT;Supervision/Assistance - 24 hour     Equipment Recommendations  Rolling walker with 5" wheels    Recommendations for Other Services       Precautions / Restrictions Precautions Precautions: Knee;Fall Required Braces or Orthoses: Knee Immobilizer - Right Knee Immobilizer - Right: Discontinue once straight leg raise with < 10 degree lag Restrictions Weight Bearing Restrictions: No    Mobility  Bed Mobility               General bed mobility comments: Pt OOB in recliner  Transfers Overall transfer level: Needs assistance Equipment used: Rolling walker (2 wheeled)   Sit to Stand: Min guard;Min assist         General transfer comment: cues for hand and R leg position.  VC's for hand placement and increased time  Ambulation/Gait Ambulation/Gait assistance: Min guard;Min assist Ambulation Distance (Feet): 54 Feet Assistive device: Rolling walker (2 wheeled) Gait Pattern/deviations: Step-to pattern Gait velocity: decreased   General Gait Details: 50% VC's on proper sequencing and safety with turns   Science writer    Modified Rankin (Stroke Patients Only)       Balance                                    Cognition Arousal/Alertness: Awake/alert Behavior During Therapy: WFL for tasks assessed/performed Overall Cognitive Status: Within Functional Limits for tasks assessed                      Exercises   Total Knee Replacement TE's 10 reps B LE ankle pumps 10 reps towel squeezes 10 reps knee presses 10 reps heel slides  10 reps SAQ's 10 reps SLR's 10 reps ABD Followed by ICE      General Comments        Pertinent Vitals/Pain Pain Assessment: 0-10 Pain Score: 3  Pain Location: R knee Pain Descriptors / Indicators: Tightness Pain Intervention(s): Monitored during session;Repositioned;Ice applied    Home Living                      Prior Function            PT Goals (current goals can now be found in the care plan section) Progress towards PT goals: Progressing toward goals    Frequency  7X/week    PT Plan Current plan remains appropriate    Co-evaluation             End of Session Equipment Utilized During Treatment: Gait belt;Right knee immobilizer Activity Tolerance: Patient tolerated treatment well Patient left: in chair;with call bell/phone within reach;with family/visitor present     Time: 0913-1000 PT Time Calculation (min) (ACUTE ONLY): 47 min  Charges:  $Gait Training: 8-22 mins $Therapeutic Exercise: 8-22 mins $Therapeutic Activity: 8-22 mins                    G Codes:      [name]  PTA [facility]  Acute  Rehab Pager      [phone]

## 2016-07-28 NOTE — Evaluation (Signed)
Occupational Therapy Evaluation Patient Details Name: James Doyle MRN: [id number] DOB: [birth year] Today's Date: 07/28/2016    History of Present Illness RTKA   Clinical Impression   Pt is s/p TKA resulting in the deficits listed below (see OT Problem List).  Pt will benefit from skilled OT to increase their safety and independence with ADL and functional mobility for ADL to facilitate discharge to venue listed below.        Follow Up Recommendations  No OT follow up    Equipment Recommendations  3 in 1 bedside comode    Recommendations for Other Services       Precautions / Restrictions Precautions Precautions: Knee;Fall Required Braces or Orthoses: Knee Immobilizer - Right Knee Immobilizer - Right: Discontinue once straight leg raise with < 10 degree lag Restrictions Weight Bearing Restrictions: No      Mobility Bed Mobility Overal bed mobility: Needs Assistance Bed Mobility: Sit to Supine       Sit to supine: Min assist   General bed mobility comments: Pt OOB in recliner  Transfers Overall transfer level: Needs assistance Equipment used: Rolling walker (2 wheeled) Transfers: Sit to/from [facility] Sit to Stand: Min assist Stand pivot transfers: Min assist       General transfer comment: cues for hand and R leg position.  VC's for hand placement and increased time    Balance                                            ADL Overall ADL's : Needs assistance/impaired             Lower Body Bathing: Minimal assistance;Sit to/from stand;Cueing for safety;Cueing for compensatory techniques           Toilet Transfer: Minimal assistance;RW;Ambulation Toilet Transfer Details (indicate cue type and reason): chair to bed           General ADL Comments: OT placed 3 n 1 over toilet for pt     Vision     Perception     Praxis      Pertinent Vitals/Pain Pain Assessment: 0-10 Pain Score: 3  Pain Location:  R knee Pain Descriptors / Indicators: Tightness Pain Intervention(s): Monitored during session;Repositioned;Ice applied     Hand Dominance     Extremity/Trunk Assessment Upper Extremity Assessment Upper Extremity Assessment: Overall WFL for tasks assessed           Communication Communication Communication: No difficulties   Cognition Arousal/Alertness: Awake/alert Behavior During Therapy: WFL for tasks assessed/performed Overall Cognitive Status: Within Functional Limits for tasks assessed                     General Comments       Exercises       Shoulder Instructions      Home Living Family/patient expects to be discharged to:: Private residence Living Arrangements: Spouse/significant other;Children Available Help at Discharge: Family Type of Home: House Home Access: Level entry     Home Layout: One level     Bathroom Shower/Tub: Tub/shower unit         Home Equipment: Kasandra Knudsen - single point   Additional Comments: plans to go to daughter's house      Prior Functioning/Environment Level of Independence: Independent             OT Diagnosis: Generalized weakness;Acute pain  OT Problem List: Decreased strength;Pain   OT Treatment/Interventions: Self-care/ADL training;Patient/family education    OT Goals(Current goals can be found in the care plan section) Acute Rehab OT Goals Patient Stated Goal: to go home OT Goal Formulation: With patient Time For Goal Achievement: 08/04/16 Potential to Achieve Goals: Good  OT Frequency: Min 2X/week   Barriers to D/C:            Co-evaluation              End of Session Equipment Utilized During Treatment: Rolling walker Nurse Communication: Mobility status  Activity Tolerance: Patient limited by fatigue Patient left: in bed;with call bell/phone within reach   Time: 1115-1132 OT Time Calculation (min): 17 min Charges:  OT General Charges $OT Visit: 1 Procedure OT Evaluation $OT  Eval Low Complexity: 1 Procedure G-Codes:    Betsy Pries 08/04/16, 3:24 PM

## 2016-07-28 NOTE — Progress Notes (Signed)
   Subjective: 1 Day Post-Op Procedure(s) (LRB): RIGHT TOTAL KNEE ARTHROPLASTY (Right) Patient reports pain as mild.  Not much sleep last night. Patient seen in rounds with Dr. Wynelle Doyle.  Doing okay this morning. Patient is well, but has had some minor complaints of pain in the knee, requiring pain medications We will resume therapy today.  If they do well with therapy and meets all goals, then will allow home later this afternoon following therapy.  He walked 110 feet with therapy yesterday. Plan is to go Home after hospital stay.  Should not need HHPT.  Will allow the patient to go straight to outpatient therapy.  Discussed going to [facility] in [place].  Will provide patient RX for outpatient therapy.  Patient is to call [facility] once he is discharged from hospital and can start on Thursday of this week.  Objective: Vital signs in last 24 hours: Temp:  [97.4 F (36.3 C)-98.4 F (36.9 C)] 98 F (36.7 C) (08/29 0649) Pulse Rate:  [55-74] 72 (08/29 0649) Resp:  [13-20] 18 (08/29 0649) BP: (101-145)/(58-85) 139/77 (08/29 0649) SpO2:  [94 %-100 %] 95 % (08/29 0649)  Intake/Output from previous day:  Intake/Output Summary (Last 24 hours) at 07/28/16 0903 Last data filed at 07/28/16 0650  Gross per 24 hour  Intake          3743.34 ml  Output             3765 ml  Net           -21.66 ml    Intake/Output this shift: No intake/output data recorded.  Labs:  Recent Labs  07/28/16 0416  HGB 11.3*    Recent Labs  07/28/16 0416  WBC 22.2*  RBC 3.80*  HCT 33.8*  PLT 173    Recent Labs  07/28/16 0416  NA 137  K 4.1  CL 106  CO2 25  BUN 12  CREATININE 1.06  GLUCOSE 145*  CALCIUM 8.6*   No results for input(s): LABPT, INR in the last 72 hours.  EXAM General - Patient is Alert, Appropriate and Oriented Extremity - Neurovascular intact Sensation intact distally Dorsiflexion/Plantar flexion intact Dressing - dressing C/D/I Motor Function - intact, moving  foot and toes well on exam.  Hemovac pulled without difficulty.  Past Medical History:  Diagnosis Date  . Arthritis   . H/O vocal cord paralysis    left     Assessment/Plan: 1 Day Post-Op Procedure(s) (LRB): RIGHT TOTAL KNEE ARTHROPLASTY (Right) Principal Problem:   OA (osteoarthritis) of knee  Estimated body mass index is 32.41 kg/m as calculated from the following:   Height as of this encounter: 6' (1.829 m).   Weight as of this encounter: 108.4 kg (239 lb). Advance diet Up with therapy Discharge home - plans to go straight to outpatient therapy.  DVT Prophylaxis - Xarelto Weight-Bearing as tolerated to right leg D/C O2 and Pulse OX and try on Room Air  If meets goals and able to go home: Diet - Regular diet Follow up - in 2 weeks Activity - WBAT Disposition - Home Condition Upon Discharge - improving D/C Meds - See DC Summary DVT Prophylaxis - [place], PA-C Orthopaedic Surgery

## 2016-07-28 NOTE — Care Management Note (Signed)
Case Management Note  Patient Details  Name: James Doyle MRN: [id number] Date of Birth: 1946-05-12  Subjective/Objective:    Patient states he is going to otpt [facility] in [place]. PT recc-rw-[facility] dme rep Jermaine aware to deliver rw to rm.No further CM needs.               Action/Plan:d/c home-dme.   Expected Discharge Date:                  Expected Discharge Plan:  Home/Self Care  In-House Referral:     Discharge planning Services  CM Consult  Post Acute Care Choice:    Choice offered to:  Patient  DME Arranged:  Walker rolling DME Agency:  [place]:    [place]:     Status of Service:  Completed, signed off  If discussed at [place] of Stay Meetings, dates discussed:    Additional Comments:  Dessa Phi, RN 07/28/2016, 11:26 AM

## 2016-07-28 NOTE — Discharge Summary (Signed)
Physician Discharge Summary   Patient ID: James BAND MRN: [id number] DOB/AGE: 1946/03/28 70 y.o.  Admit date: 07/27/2016 Discharge date: 07/01/2016  Primary Diagnosis:  Osteoarthritis  Right knee(s)  Admission Diagnoses:  Past Medical History:  Diagnosis Date  . Arthritis   . H/O vocal cord paralysis    left    Discharge Diagnoses:   Principal Problem:   OA (osteoarthritis) of knee  Estimated body mass index is 32.41 kg/m as calculated from the following:   Height as of this encounter: 6' (1.829 m).   Weight as of this encounter: 108.4 kg (239 lb).  Procedure:  Procedure(s) (LRB): RIGHT TOTAL KNEE ARTHROPLASTY (Right)   Consults: None  HPI: James Doyle is a 70 y.o. year old male with end stage OA of his right knee with progressively worsening pain and dysfunction. He has constant pain, with activity and at rest and significant functional deficits with difficulties even with ADLs. He has had extensive non-op management including analgesics, injections of cortisone, and home exercise program, but remains in significant pain with significant dysfunction. Radiographs show bone on bone arthritis medial and patellofemoral. He presents now for right Total Knee Arthroplasty.   Laboratory Data: Admission on 07/27/2016  Component Date Value Ref Range Status  . ABO/RH(D) 07/18/2016 O POS   Final  . WBC 07/28/2016 22.2* 4.0 - 10.5 K/uL Final  . RBC 07/28/2016 3.80* 4.22 - 5.81 MIL/uL Final  . Hemoglobin 07/28/2016 11.3* 13.0 - 17.0 g/dL Final  . HCT 07/28/2016 33.8* 39.0 - 52.0 % Final  . MCV 07/28/2016 88.9  78.0 - 100.0 fL Final  . MCH 07/28/2016 29.7  26.0 - 34.0 pg Final  . MCHC 07/28/2016 33.4  30.0 - 36.0 g/dL Final  . RDW 07/28/2016 13.1  11.5 - 15.5 % Final  . Platelets 07/28/2016 173  150 - 400 K/uL Final  . Sodium 07/28/2016 137  135 - 145 mmol/L Final  . Potassium 07/28/2016 4.1  3.5 - 5.1 mmol/L Final  . Chloride 07/28/2016 106  101 - 111 mmol/L Final  . CO2  07/28/2016 25  22 - 32 mmol/L Final  . Glucose, Bld 07/28/2016 145* 65 - 99 mg/dL Final  . BUN 07/28/2016 12  6 - 20 mg/dL Final  . Creatinine, Ser 07/28/2016 1.06  0.61 - 1.24 mg/dL Final  . Calcium 07/28/2016 8.6* 8.9 - 10.3 mg/dL Final  . GFR calc non Af Amer 07/28/2016 >60  >60 mL/min Final  . GFR calc Af Amer 07/28/2016 >60  >60 mL/min Final   Comment: (NOTE) The eGFR has been calculated using the CKD EPI equation. This calculation has not been validated in all clinical situations. eGFR's persistently <60 mL/min signify possible Chronic Kidney Disease.   Georgiann Hahn gap 07/28/2016 6  5 - 15 Final  Hospital Outpatient Visit on 07/17/2016  Component Date Value Ref Range Status  . aPTT 07/17/2016 33  24 - 36 seconds Final  . WBC 07/17/2016 11.7* 4.0 - 10.5 K/uL Final  . RBC 07/17/2016 4.94  4.22 - 5.81 MIL/uL Final  . Hemoglobin 07/17/2016 14.3  13.0 - 17.0 g/dL Final  . HCT 07/17/2016 43.8  39.0 - 52.0 % Final  . MCV 07/17/2016 88.7  78.0 - 100.0 fL Final  . MCH 07/17/2016 28.9  26.0 - 34.0 pg Final  . MCHC 07/17/2016 32.6  30.0 - 36.0 g/dL Final  . RDW 07/17/2016 13.3  11.5 - 15.5 % Final  . Platelets 07/17/2016 207  150 - 400 K/uL  Final  . Sodium 07/17/2016 141  135 - 145 mmol/L Final  . Potassium 07/17/2016 3.7  3.5 - 5.1 mmol/L Final  . Chloride 07/17/2016 105  101 - 111 mmol/L Final  . CO2 07/17/2016 29  22 - 32 mmol/L Final  . Glucose, Bld 07/17/2016 97  65 - 99 mg/dL Final  . BUN 07/17/2016 15  6 - 20 mg/dL Final  . Creatinine, Ser 07/17/2016 1.12  0.61 - 1.24 mg/dL Final  . Calcium 07/17/2016 9.2  8.9 - 10.3 mg/dL Final  . Total Protein 07/17/2016 7.5  6.5 - 8.1 g/dL Final  . Albumin 07/17/2016 4.3  3.5 - 5.0 g/dL Final  . AST 07/17/2016 24  15 - 41 U/L Final  . ALT 07/17/2016 23  17 - 63 U/L Final  . Alkaline Phosphatase 07/17/2016 90  38 - 126 U/L Final  . Total Bilirubin 07/17/2016 0.6  0.3 - 1.2 mg/dL Final  . GFR calc non Af Amer 07/17/2016 >60  >60 mL/min Final    . GFR calc Af Amer 07/17/2016 >60  >60 mL/min Final   Comment: (NOTE) The eGFR has been calculated using the CKD EPI equation. This calculation has not been validated in all clinical situations. eGFR's persistently <60 mL/min signify possible Chronic Kidney Disease.   . Anion gap 07/17/2016 7  5 - 15 Final  . Prothrombin Time 07/17/2016 13.1  11.4 - 15.2 seconds Final  . INR 07/17/2016 0.99   Final  . ABO/RH(D) 07/27/2016 O POS   Final  . Antibody Screen 07/27/2016 NEG   Final  . Sample Expiration 07/27/2016 07/30/2016   Final  . Extend sample reason 07/27/2016 NO TRANSFUSIONS OR PREGNANCY IN THE PAST 3 MONTHS   Final  . Color, Urine 07/17/2016 YELLOW  YELLOW Final  . APPearance 07/17/2016 CLOUDY* CLEAR Final  . Specific Gravity, Urine 07/17/2016 1.020  1.005 - 1.030 Final  . pH 07/17/2016 6.0  5.0 - 8.0 Final  . Glucose, UA 07/17/2016 NEGATIVE  NEGATIVE mg/dL Final  . Hgb urine dipstick 07/17/2016 NEGATIVE  NEGATIVE Final  . Bilirubin Urine 07/17/2016 SMALL* NEGATIVE Final  . Ketones, ur 07/17/2016 40* NEGATIVE mg/dL Final  . Protein, ur 07/17/2016 30* NEGATIVE mg/dL Final  . Nitrite 07/17/2016 NEGATIVE  NEGATIVE Final  . Leukocytes, UA 07/17/2016 SMALL* NEGATIVE Final  . MRSA, PCR 07/17/2016 NEGATIVE  NEGATIVE Final  . Staphylococcus aureus 07/17/2016 NEGATIVE  NEGATIVE Final   Comment:        The Xpert SA Assay (FDA approved for NASAL specimens in patients over 32 years of age), is one component of a comprehensive surveillance program.  Test performance has been validated by [facility] for patients greater than or equal to 39 year old. It is not intended to diagnose infection nor to guide or monitor treatment.   . Squamous Epithelial / LPF 07/17/2016 0-5* NONE SEEN Final  . WBC, UA 07/17/2016 0-5  0 - 5 WBC/hpf Final  . RBC / HPF 07/17/2016 0-5  0 - 5 RBC/hpf Final  . Bacteria, UA 07/17/2016 FEW* NONE SEEN Final  . Casts 07/17/2016 HYALINE CASTS* NEGATIVE Final  .  Urine-Other 07/17/2016 TRICHOMONAS PRESENT   Final     X-Rays:No results found.  EKG:No orders found for this or any previous visit.   Hospital Course: James Doyle is a 70 y.o. who was admitted to [facility]. They were brought to the operating room on 07/27/2016 and underwent Procedure(s): RIGHT TOTAL KNEE ARTHROPLASTY.  Patient tolerated the  procedure well and was later transferred to the recovery room and then to the orthopaedic floor for postoperative care.  They were given PO and IV analgesics for pain control following their surgery.  They were given 24 hours of postoperative antibiotics of  Anti-infectives    Start     Dose/Rate Route Frequency Ordered Stop   07/27/16 1400  ceFAZolin (ANCEF) IVPB 2g/100 mL premix     2 g 200 mL/hr over 30 Minutes Intravenous Every 6 hours 07/27/16 1108 07/27/16 2113   07/27/16 0609  ceFAZolin (ANCEF) IVPB 2g/100 mL premix     2 g 200 mL/hr over 30 Minutes Intravenous On call to O.R. 07/27/16 9485 07/27/16 4627     and started on DVT prophylaxis in the form of Xarelto.   PT and OT were ordered for total joint protocol.  Discharge planning consulted to help with postop disposition and equipment needs.   Patient reports pain as mild.  Not much sleep that first night.  Patient seen in rounds with Dr. Wynelle Link.  Doing okay that morning. Patient was well, but has had some minor complaints of pain in the knee, requiring pain medications   He walked 110 feet with therapy on day of surgery. Plan was to go Home after hospital stay.  Discussed going to [facility] in [place].  Will provide patient RX for outpatient therapy.  Patient is to call [facility] once he is discharged from hospital and can start on Thursday of this week.  Diet - Regular diet Follow up - in 2 weeks Activity - WBAT Disposition - Home Condition Upon Discharge - improving D/C Meds - See DC Summary DVT Prophylaxis - Xarelto  Discharge Instructions    Call MD / Call 911     Complete by:  As directed   If you experience chest pain or shortness of breath, CALL 911 and be transported to the hospital emergency room.  If you develope a fever above 101 F, pus (white drainage) or increased drainage or redness at the wound, or calf pain, call your surgeon's office.   Change dressing    Complete by:  As directed   Change dressing daily with sterile 4 x 4 inch gauze dressing and apply TED hose. Do not submerge the incision under water.   Constipation Prevention    Complete by:  As directed   Drink plenty of fluids.  Prune juice may be helpful.  You may use a stool softener, such as Colace (over the counter) 100 mg twice a day.  Use MiraLax (over the counter) for constipation as needed.   Diet general    Complete by:  As directed   Discharge instructions    Complete by:  As directed   Pick up stool softner and laxative for home use following surgery while on pain medications. Do not submerge incision under water. Please use good hand washing techniques while changing dressing each day. May shower starting three days after surgery. Please use a clean towel to pat the incision dry following showers. Continue to use ice for pain and swelling after surgery. Do not use any lotions or creams on the incision until instructed by your surgeon.   Postoperative Constipation Protocol  Constipation - defined medically as fewer than three stools per week and severe constipation as less than one stool per week.  One of the most common issues patients have following surgery is constipation.  Even if you have a regular bowel pattern at home, your  normal regimen is likely to be disrupted due to multiple reasons following surgery.  Combination of anesthesia, postoperative narcotics, change in appetite and fluid intake all can affect your bowels.  In order to avoid complications following surgery, here are some recommendations in order to help you during your recovery period.  Colace  (docusate) - Pick up an over-the-counter form of Colace or another stool softener and take twice a day as long as you are requiring postoperative pain medications.  Take with a full glass of water daily.  If you experience loose stools or diarrhea, hold the colace until you stool forms back up.  If your symptoms do not get better within 1 week or if they get worse, check with your doctor.  Dulcolax (bisacodyl) - Pick up over-the-counter and take as directed by the product packaging as needed to assist with the movement of your bowels.  Take with a full glass of water.  Use this product as needed if not relieved by Colace only.   MiraLax (polyethylene glycol) - Pick up over-the-counter to have on hand.  MiraLax is a solution that will increase the amount of water in your bowels to assist with bowel movements.  Take as directed and can mix with a glass of water, juice, soda, coffee, or tea.  Take if you go more than two days without a movement. Do not use MiraLax more than once per day. Call your doctor if you are still constipated or irregular after using this medication for 7 days in a row.  If you continue to have problems with postoperative constipation, please contact the office for further assistance and recommendations.  If you experience "the worst abdominal pain ever" or develop nausea or vomiting, please contact the office immediatly for further recommendations for treatment.   Take Xarelto for two and a half more weeks, then discontinue Xarelto. Once the patient has completed the blood thinner regimen, then take a Baby 81 mg Aspirin daily for three more weeks.   Do not put a pillow under the knee. Place it under the heel.    Complete by:  As directed   Do not sit on low chairs, stoools or toilet seats, as it may be difficult to get up from low surfaces    Complete by:  As directed   Driving restrictions    Complete by:  As directed   No driving until released by the physician.   Increase  activity slowly as tolerated    Complete by:  As directed   Lifting restrictions    Complete by:  As directed   No lifting until released by the physician.   Patient may shower    Complete by:  As directed   You may shower without a dressing once there is no drainage.  Do not wash over the wound.  If drainage remains, do not shower until drainage stops.   TED hose    Complete by:  As directed   Use stockings (TED hose) for 3 weeks on both leg(s).  You may remove them at night for sleeping.   Weight bearing as tolerated    Complete by:  As directed   Laterality:  right   Extremity:  Lower       Medication List    TAKE these medications   methocarbamol 500 MG tablet Commonly known as:  ROBAXIN Take 1 tablet (500 mg total) by mouth every 6 (six) hours as needed for muscle spasms.   oxyCODONE 5 MG  immediate release tablet Commonly known as:  Oxy IR/ROXICODONE Take 1-2 tablets (5-10 mg total) by mouth every 3 (three) hours as needed for moderate pain or severe pain.   rivaroxaban 10 MG Tabs tablet Commonly known as:  XARELTO Take 1 tablet (10 mg total) by mouth daily with breakfast. Take Xarelto for two and a half more weeks, then discontinue Xarelto. Once the patient has completed the blood thinner regimen, then take a Baby 81 mg Aspirin daily for three more weeks.      Follow-up Information    [name], MD. Schedule an appointment as soon as possible for a visit on 08/11/2016.   Specialty:  Orthopedic Surgery Why:  Call office at [phone] to setup appointment on Tuesday 08/11/2016 with Dr. Wynelle Link. Contact information: [address] [place] [address] [phone]           Signed: Arlee Muslim, PA-C Orthopaedic Surgery 07/28/2016, 9:14 AM

## 2016-07-28 NOTE — Progress Notes (Signed)
Physical Therapy Treatment Patient Details Name: James Doyle MRN: [id number] DOB: 1946-09-01 Today's Date: 07/28/2016    History of Present Illness RTKA    PT Comments    POD # 1 pm session Assisted with amb a greater distance and completed sitting TE's.  Pt ready for D/C to home.     Follow Up Recommendations  Home health PT;Supervision/Assistance - 24 hour     Equipment Recommendations  Rolling walker with 5" wheels    Recommendations for Other Services       Precautions / Restrictions Precautions Precautions: Knee;Fall Required Braces or Orthoses: Knee Immobilizer - Right Knee Immobilizer - Right: Discontinue once straight leg raise with < 10 degree lag Restrictions Weight Bearing Restrictions: No    Mobility  Bed Mobility               General bed mobility comments: Pt OOB in recliner  Transfers Overall transfer level: Needs assistance Equipment used: Rolling walker (2 wheeled)   Sit to Stand: Min guard;Min assist         General transfer comment: cues for hand and R leg position.  VC's for hand placement and increased time  Ambulation/Gait Ambulation/Gait assistance: Min guard;Min assist Ambulation Distance (Feet): 75 Feet Assistive device: Rolling walker (2 wheeled) Gait Pattern/deviations: Step-to pattern Gait velocity: decreased   General Gait Details: 25% VC's on proper sequencing and safety with turns.  demonstarted increased balance    Stairs            Wheelchair Mobility    Modified Rankin (Stroke Patients Only)       Balance                                    Cognition Arousal/Alertness: Awake/alert Behavior During Therapy: WFL for tasks assessed/performed Overall Cognitive Status: Within Functional Limits for tasks assessed                      Exercises      General Comments        Pertinent Vitals/Pain Pain Assessment: 0-10 Pain Score: 3  Pain Location: R knee Pain Descriptors /  Indicators: Tightness Pain Intervention(s): Monitored during session;Repositioned;Ice applied    Home Living                      Prior Function            PT Goals (current goals can now be found in the care plan section) Progress towards PT goals: Progressing toward goals    Frequency  7X/week    PT Plan Current plan remains appropriate    Co-evaluation             End of Session Equipment Utilized During Treatment: Gait belt;Right knee immobilizer Activity Tolerance: Patient tolerated treatment well Patient left: in chair;with call bell/phone within reach;with family/visitor present     Time: 1335-1400 PT Time Calculation (min) (ACUTE ONLY): 25 min  Charges:  $Gait Training: 8-22 mins $Therapeutic Activity: 8-22 mins                    G Codes:      [name]  PTA [facility]  Acute  Rehab Pager      [phone]

## 2016-07-29 LAB — BASIC METABOLIC PANEL
Anion gap: 6 (ref 5–15)
BUN: 14 mg/dL (ref 6–20)
CHLORIDE: 103 mmol/L (ref 101–111)
CO2: 27 mmol/L (ref 22–32)
Calcium: 8.7 mg/dL — ABNORMAL LOW (ref 8.9–10.3)
Creatinine, Ser: 1.01 mg/dL (ref 0.61–1.24)
Glucose, Bld: 137 mg/dL — ABNORMAL HIGH (ref 65–99)
POTASSIUM: 3.7 mmol/L (ref 3.5–5.1)
SODIUM: 136 mmol/L (ref 135–145)

## 2016-07-29 LAB — CBC
HCT: 33.4 % — ABNORMAL LOW (ref 39.0–52.0)
HEMOGLOBIN: 11.2 g/dL — AB (ref 13.0–17.0)
MCH: 28.9 pg (ref 26.0–34.0)
MCHC: 33.5 g/dL (ref 30.0–36.0)
MCV: 86.3 fL (ref 78.0–100.0)
PLATELETS: 174 10*3/uL (ref 150–400)
RBC: 3.87 MIL/uL — AB (ref 4.22–5.81)
RDW: 13 % (ref 11.5–15.5)
WBC: 19.8 10*3/uL — AB (ref 4.0–10.5)

## 2016-07-29 NOTE — Care Management Note (Signed)
Case Management Note  Patient Details  Name: James Doyle MRN: [id number] Date of Birth: March 01, 1946  Subjective/Objective:  Pt/spouse expressed confusion over plans after discharge in re: to [facility]. Entered all contact info for [facility] on AVS as well as discussed at length with pt and spouse the following. Call Outpt ctr after discharge and make arrangements to attend initial PT session, preferably in am. Take Rx to first visit.                   Action/Plan: Anticipate discharge home later today. today. No further CM needs but will be available should additional discharge needs arise.   Expected Discharge Date:                  Expected Discharge Plan:  Home/Self Care  In-House Referral:     Discharge planning Services  CM Consult  Post Acute Care Choice:    Choice offered to:  Patient  DME Arranged:  [facility] D[facility] Agency:  [place]:    [place]:     Status of Service:  Completed, signed off  If discussed at [place] of Stay Meetings, dates discussed:    Additional Comments:  Delrae Sawyers, RN 07/29/2016, 11:15 AM

## 2016-07-29 NOTE — Progress Notes (Signed)
Occupational Therapy Treatment Patient Details Name: James Doyle MRN: [id number] DOB: 1946-06-13 Today's Date: 07/29/2016    History of present illness RTKA   OT comments  Pt continues to progress well. Answered all questions regarding ADLs and informed RN of OT recommendation for 3 in 1 for home.   Follow Up Recommendations  No OT follow up    Equipment Recommendations  3 in 1 bedside comode    Recommendations for Other Services      Precautions / Restrictions Precautions Precautions: Knee;Fall Required Braces or Orthoses: Knee Immobilizer - Right Knee Immobilizer - Right: Discontinue once straight leg raise with < 10 degree lag Restrictions Weight Bearing Restrictions: No Other Position/Activity Restrictions: WBAT       Mobility Bed Mobility              Transfers                  Balance                                   ADL Overall ADL's : Needs assistance/impaired                                    General ADL Comments: Patient received up in recliner, dressed in personal clothing. Reviewed LB self-care techniques with patient and wife and they verbalized understanding. Patient reports no other questions regarding ADLs. but wife asking about 3 in 1 for home. Informed RN that pt would like 3 in 1 for home and she will contact case manager.       Vision                     Perception     Praxis      Cognition   Behavior During Therapy: WFL for tasks assessed/performed Overall Cognitive Status: Within Functional Limits for tasks assessed                       Extremity/Trunk Assessment               Exercises     Shoulder Instructions       General Comments      Pertinent Vitals/ Pain       Pain Assessment: No/denies pain  Home Living                                          Prior Functioning/Environment              Frequency Min 2X/week      Progress Toward Goals  OT Goals(current goals can now be found in the care plan section)  Progress towards OT goals: Progressing toward goals  Acute Rehab OT Goals Patient Stated Goal: home today  Plan Discharge plan remains appropriate    Co-evaluation                 End of Session    Activity Tolerance Patient tolerated treatment well   Patient Left in chair;with call bell/phone within reach;with family/visitor present   Nurse Communication Other (comment) (pt requests 3 in 1 for home use)        Time: 3329-5188 OT Time Calculation (min):  8 min  Charges: OT General Charges $OT Visit: 1 Procedure OT Treatments $Self Care/Home Management : 8-22 mins  James Doyle A 07/29/2016, 1:36 PM

## 2016-07-29 NOTE — Progress Notes (Signed)
   Subjective: 2 Days Post-Op Procedure(s) (LRB): RIGHT TOTAL KNEE ARTHROPLASTY (Right) Patient reports pain as mild.   Plan is to go Home after hospital stay.  Objective: Vital signs in last 24 hours: Temp:  [97.8 F (36.6 C)-99.6 F (37.6 C)] 99.6 F (37.6 C) (08/30 0546) Pulse Rate:  [64-80] 71 (08/30 0546) Resp:  [15-16] 16 (08/30 0546) BP: (105-177)/(61-82) 169/73 (08/30 0546) SpO2:  [93 %-97 %] 96 % (08/30 0546)  Intake/Output from previous day:  Intake/Output Summary (Last 24 hours) at 07/29/16 0906 Last data filed at 07/29/16 0745  Gross per 24 hour  Intake          2571.67 ml  Output              400 ml  Net          2171.67 ml    Intake/Output this shift: Total I/O In: 240 [P.O.:240] Out: -   Labs:  Recent Labs  07/28/16 0416 07/29/16 0419  HGB 11.3* 11.2*    Recent Labs  07/28/16 0416 07/29/16 0419  WBC 22.2* 19.8*  RBC 3.80* 3.87*  HCT 33.8* 33.4*  PLT 173 174    Recent Labs  07/28/16 0416 07/29/16 0419  NA 137 136  K 4.1 3.7  CL 106 103  CO2 25 27  BUN 12 14  CREATININE 1.06 1.01  GLUCOSE 145* 137*  CALCIUM 8.6* 8.7*   No results for input(s): LABPT, INR in the last 72 hours.  EXAM General - Patient is Alert, Appropriate and Oriented Extremity - Neurologically intact Neurovascular intact No cellulitis present Compartment soft Dressing/Incision - clean, dry, no drainage Motor Function - intact, moving foot and toes well on exam.   Past Medical History:  Diagnosis Date  . Arthritis   . H/O vocal cord paralysis    left     Assessment/Plan: 2 Days Post-Op Procedure(s) (LRB): RIGHT TOTAL KNEE ARTHROPLASTY (Right) Principal Problem:   OA (osteoarthritis) of knee   Discharge home today after PT. Will go to outpatient PT post-discharge  DVT Prophylaxis - Xarelto Weight-Bearing as tolerated to right leg  Gigi Onstad V 07/29/2016, 9:06 AM

## 2016-07-29 NOTE — Progress Notes (Signed)
Physical Therapy Treatment Patient Details Name: James Doyle MRN: [id number] DOB: 1946-11-21 Today's Date: 07/29/2016    History of Present Illness RTKA    PT Comments    POD # 2 Am session assisted OOB to amb a greater distance in hallway, performed all supine TKR TE's following HEP handout.  Instructed on proper tech and freq esp since pt is going to OP but only 3 days a week.  Instructed to perform HEP daily and use ICE only.  No stairs to enter home.    Follow Up Recommendations   (OP [place])     Equipment Recommendations  Rolling walker with 5" wheels;3in1 (PT)    Recommendations for Other Services       Precautions / Restrictions Precautions Precautions: Knee;Fall Required Braces or Orthoses: Knee Immobilizer - Right Knee Immobilizer - Right: Discontinue once straight leg raise with < 10 degree lag Restrictions Weight Bearing Restrictions: No Other Position/Activity Restrictions: WBAT    Mobility  Bed Mobility Overal bed mobility: Needs Assistance Bed Mobility: Supine to Sit     Supine to sit: Min guard     General bed mobility comments: for RLE and increased time  Transfers Overall transfer level: Needs assistance Equipment used: Rolling walker (2 wheeled) Transfers: Sit to/from Stand Sit to Stand: Min guard         General transfer comment: cues for hand and R leg position.  VC's for hand placement and increased time  Ambulation/Gait Ambulation/Gait assistance: Supervision;Min guard Ambulation Distance (Feet): 82 Feet Assistive device: Rolling walker (2 wheeled)   Gait velocity: decreased   General Gait Details: <25% VC's on proper sequencing and safety with turns.  demonstarted increased balance and tolerated increased distance.   Stairs            Wheelchair Mobility    Modified Rankin (Stroke Patients Only)       Balance                                    Cognition Arousal/Alertness:  Awake/alert Behavior During Therapy: WFL for tasks assessed/performed Overall Cognitive Status: Within Functional Limits for tasks assessed                      Exercises   Total Knee Replacement TE's 10 reps B LE ankle pumps 10 reps towel squeezes 10 reps knee presses 10 reps heel slides  10 reps SAQ's 10 reps SLR's 10 reps ABD Followed by ICE     General Comments        Pertinent Vitals/Pain Pain Assessment: 0-10 Pain Score: 4  Pain Location: R knee Pain Descriptors / Indicators: Discomfort;Tender Pain Intervention(s): Monitored during session;Repositioned;Ice applied    Home Living                      Prior Function            PT Goals (current goals can now be found in the care plan section) Acute Rehab PT Goals Patient Stated Goal: home today Progress towards PT goals: Progressing toward goals    Frequency  7X/week    PT Plan Current plan remains appropriate    Co-evaluation             End of Session Equipment Utilized During Treatment: Gait belt Activity Tolerance: Patient tolerated treatment well Patient left: in chair;with call bell/phone within reach;with family/visitor  present     Time: 0928-1022 PT Time Calculation (min) (ACUTE ONLY): 54 min  Charges:  $Gait Training: 8-22 mins $Therapeutic Exercise: 8-22 mins $Therapeutic Activity: 8-22 mins $Self Care/Home Management: 8-22                    G Codes:      James Doyle  PTA [facility]  Acute  Rehab Pager      [phone]

## 2016-07-29 NOTE — Progress Notes (Signed)
Pt d/c'd home. Both 3n1 and RW delivered to patient prior to d/c. Patient has all prescriptions, including one for outpatient PT in [place]. Patient understands dressing changes and signs and symptoms of infection. No further concerns, and pain is under control on current pain medication.

## 2016-07-29 NOTE — Progress Notes (Signed)
Occupational Therapy Treatment Patient Details Name: James Doyle MRN: [id number] DOB: February 06, 1946 Today's Date: 07/29/2016    History of present illness RTKA   OT comments  Patient progressing well. Will follow up one more session today to address other ADLs prior to discharge home.  Follow Up Recommendations  No OT follow up    Equipment Recommendations  3 in 1 bedside comode    Recommendations for Other Services      Precautions / Restrictions Precautions Precautions: Knee;Fall Required Braces or Orthoses: Knee Immobilizer - Right Knee Immobilizer - Right: Discontinue once straight leg raise with < 10 degree lag Restrictions Weight Bearing Restrictions: No Other Position/Activity Restrictions: WBAT       Mobility Bed Mobility Overal bed mobility: Needs Assistance Bed Mobility: Supine to Sit     Supine to sit: Min assist     General bed mobility comments: for RLE  Transfers Overall transfer level: Needs assistance Equipment used: Rolling walker (2 wheeled) Transfers: Sit to/from Stand Sit to Stand: Min guard              Balance                                   ADL Overall ADL's : Needs assistance/impaired                       Lower Body Dressing Details (indicate cue type and reason): OT donned pt's socks Toilet Transfer: Min guard;Ambulation;BSC;RW           Functional mobility during ADLs: Min guard;Minimal assistance;Rolling walker General ADL Comments: Patient assisted with donning socks and ambulation to bathroom/toilet transfer. Patient wanted to sit there "for a while." Will return later for an additional session prior to discharge to address other ADLs.      Vision                     Perception     Praxis      Cognition   Behavior During Therapy: WFL for tasks assessed/performed Overall Cognitive Status: Within Functional Limits for tasks assessed                        Extremity/Trunk Assessment               Exercises     Shoulder Instructions       General Comments      Pertinent Vitals/ Pain       Pain Assessment: No/denies pain  Home Living                                          Prior Functioning/Environment              Frequency Min 2X/week     Progress Toward Goals  OT Goals(current goals can now be found in the care plan section)  Progress towards OT goals: Progressing toward goals  Acute Rehab OT Goals Patient Stated Goal: home today  Plan Discharge plan remains appropriate    Co-evaluation                 End of Session Equipment Utilized During Treatment: Rolling walker   Activity Tolerance Patient tolerated treatment well   Patient Left Other (comment) (in  bathroom; notified nurse tech; pt to pull call light )   Nurse Communication Mobility status;Other (comment) (informed nurse tech pt in bathroom and would call)        Time: [phone] OT Time Calculation (min): 20 min  Charges: OT General Charges $OT Visit: 1 Procedure OT Treatments $Self Care/Home Management : 8-22 mins  Prestina Raigoza A 07/29/2016, 1:31 PM

## 2017-06-08 ENCOUNTER — Observation Stay (HOSPITAL_COMMUNITY): Payer: Medicare Other

## 2017-06-08 ENCOUNTER — Inpatient Hospital Stay (HOSPITAL_COMMUNITY)
Admission: EM | Admit: 2017-06-08 | Discharge: 2017-06-15 | DRG: 330 | Disposition: A | Discharge status: Home Health | Payer: Medicare Other | Attending: General Surgery | Admitting: General Surgery

## 2017-06-08 ENCOUNTER — Encounter: Payer: Self-pay | Admitting: General Surgery

## 2017-06-08 ENCOUNTER — Telehealth: Payer: Self-pay | Admitting: General Surgery

## 2017-06-08 ENCOUNTER — Encounter (HOSPITAL_COMMUNITY): Payer: Self-pay

## 2017-06-08 DIAGNOSIS — C186 Malignant neoplasm of descending colon: Secondary | ICD-10-CM | POA: Diagnosis present

## 2017-06-08 DIAGNOSIS — Z933 Colostomy status: Secondary | ICD-10-CM

## 2017-06-08 DIAGNOSIS — K56609 Unspecified intestinal obstruction, unspecified as to partial versus complete obstruction: Secondary | ICD-10-CM

## 2017-06-08 DIAGNOSIS — E669 Obesity, unspecified: Secondary | ICD-10-CM | POA: Diagnosis present

## 2017-06-08 DIAGNOSIS — C184 Malignant neoplasm of transverse colon: Principal | ICD-10-CM | POA: Diagnosis present

## 2017-06-08 DIAGNOSIS — K639 Disease of intestine, unspecified: Secondary | ICD-10-CM | POA: Diagnosis not present

## 2017-06-08 DIAGNOSIS — N179 Acute kidney failure, unspecified: Secondary | ICD-10-CM | POA: Diagnosis present

## 2017-06-08 DIAGNOSIS — Z7901 Long term (current) use of anticoagulants: Secondary | ICD-10-CM

## 2017-06-08 DIAGNOSIS — D509 Iron deficiency anemia, unspecified: Secondary | ICD-10-CM | POA: Diagnosis present

## 2017-06-08 DIAGNOSIS — Z7982 Long term (current) use of aspirin: Secondary | ICD-10-CM

## 2017-06-08 DIAGNOSIS — Z96651 Presence of right artificial knee joint: Secondary | ICD-10-CM | POA: Diagnosis present

## 2017-06-08 DIAGNOSIS — K6389 Other specified diseases of intestine: Secondary | ICD-10-CM

## 2017-06-08 DIAGNOSIS — Z6831 Body mass index (BMI) 31.0-31.9, adult: Secondary | ICD-10-CM

## 2017-06-08 HISTORY — DX: Malignant neoplasm of descending colon: C18.6

## 2017-06-08 LAB — COMPREHENSIVE METABOLIC PANEL
ALK PHOS: 97 U/L (ref 38–126)
ALT: 12 U/L — AB (ref 17–63)
AST: 18 U/L (ref 15–41)
Albumin: 4.4 g/dL (ref 3.5–5.0)
Anion gap: 11 (ref 5–15)
BUN: 16 mg/dL (ref 6–20)
CALCIUM: 9.5 mg/dL (ref 8.9–10.3)
CO2: 25 mmol/L (ref 22–32)
CREATININE: 1.3 mg/dL — AB (ref 0.61–1.24)
Chloride: 100 mmol/L — ABNORMAL LOW (ref 101–111)
GFR, EST NON AFRICAN AMERICAN: 53 mL/min — AB (ref >60)
Glucose, Bld: 135 mg/dL — ABNORMAL HIGH (ref 65–99)
Potassium: 3.5 mmol/L (ref 3.5–5.1)
Sodium: 136 mmol/L (ref 135–145)
Total Bilirubin: 1.1 mg/dL (ref 0.3–1.2)
Total Protein: 8.3 g/dL — ABNORMAL HIGH (ref 6.5–8.1)

## 2017-06-08 LAB — URINALYSIS, ROUTINE W REFLEX MICROSCOPIC
Bilirubin Urine: NEGATIVE
GLUCOSE, UA: NEGATIVE mg/dL
Hgb urine dipstick: NEGATIVE
KETONES UR: NEGATIVE mg/dL
LEUKOCYTES UA: NEGATIVE
Nitrite: NEGATIVE
PH: 5 (ref 5.0–8.0)
Protein, ur: 30 mg/dL — AB
SPECIFIC GRAVITY, URINE: 1.02 (ref 1.005–1.030)
SQUAMOUS EPITHELIAL / LPF: NONE SEEN

## 2017-06-08 LAB — LIPASE, BLOOD: Lipase: 19 U/L (ref 11–51)

## 2017-06-08 LAB — CBC
HCT: 34.5 % — ABNORMAL LOW (ref 39.0–52.0)
Hemoglobin: 10.8 g/dL — ABNORMAL LOW (ref 13.0–17.0)
MCH: 22.9 pg — AB (ref 26.0–34.0)
MCHC: 31.3 g/dL (ref 30.0–36.0)
MCV: 73.2 fL — ABNORMAL LOW (ref 78.0–100.0)
PLATELETS: 221 10*3/uL (ref 150–400)
RBC: 4.71 MIL/uL (ref 4.22–5.81)
RDW: 15.1 % (ref 11.5–15.5)
WBC: 19.7 10*3/uL — AB (ref 4.0–10.5)

## 2017-06-08 MED ORDER — MORPHINE SULFATE (PF) 2 MG/ML IV SOLN
1.0000 mg | INTRAVENOUS | Status: DC | PRN
  Administered 2017-06-08 (×2): 2 mg via INTRAVENOUS
  Filled 2017-06-08 (×2): qty 1

## 2017-06-08 MED ORDER — SODIUM CHLORIDE 0.9 % IV BOLUS (SEPSIS)
1000.0000 mL | Freq: Once | INTRAVENOUS | Status: AC
  Administered 2017-06-08: 1000 mL via INTRAVENOUS

## 2017-06-08 MED ORDER — ONDANSETRON HCL 4 MG/2ML IJ SOLN
4.0000 mg | Freq: Once | INTRAMUSCULAR | Status: AC
  Administered 2017-06-08: 4 mg via INTRAVENOUS
  Filled 2017-06-08: qty 2

## 2017-06-08 MED ORDER — CIPROFLOXACIN IN D5W 400 MG/200ML IV SOLN
400.0000 mg | Freq: Two times a day (BID) | INTRAVENOUS | Status: DC
Start: 2017-06-08 — End: 2017-06-09
  Administered 2017-06-08: 400 mg via INTRAVENOUS
  Filled 2017-06-08: qty 200

## 2017-06-08 MED ORDER — DIPHENHYDRAMINE HCL 50 MG/ML IJ SOLN: 12.5000 mg | Freq: Four times a day (QID) | INTRAMUSCULAR | Status: DC | PRN

## 2017-06-08 MED ORDER — SIMETHICONE 80 MG PO CHEW
40.0000 mg | CHEWABLE_TABLET | Freq: Four times a day (QID) | ORAL | Status: DC | PRN
  Filled 2017-06-08: qty 1

## 2017-06-08 MED ORDER — IOPAMIDOL (ISOVUE-300) INJECTION 61%
INTRAVENOUS | Status: AC
  Filled 2017-06-08: qty 100

## 2017-06-08 MED ORDER — ONDANSETRON 4 MG PO TBDP
4.0000 mg | ORAL_TABLET | Freq: Four times a day (QID) | ORAL | Status: DC | PRN
  Administered 2017-06-11: 4 mg via ORAL
  Filled 2017-06-08: qty 1

## 2017-06-08 MED ORDER — IOPAMIDOL (ISOVUE-300) INJECTION 61%
100.0000 mL | Freq: Once | INTRAVENOUS | Status: AC | PRN
  Administered 2017-06-08: 100 mL via INTRAVENOUS

## 2017-06-08 MED ORDER — ONDANSETRON HCL 4 MG/2ML IJ SOLN
4.0000 mg | Freq: Four times a day (QID) | INTRAMUSCULAR | Status: DC | PRN
  Administered 2017-06-09: 4 mg via INTRAVENOUS

## 2017-06-08 MED ORDER — HYDRALAZINE HCL 20 MG/ML IJ SOLN
10.0000 mg | INTRAMUSCULAR | Status: DC | PRN
  Filled 2017-06-08: qty 0.5

## 2017-06-08 MED ORDER — DIPHENHYDRAMINE HCL 12.5 MG/5ML PO ELIX: 12.5000 mg | ORAL_SOLUTION | Freq: Four times a day (QID) | ORAL | Status: DC | PRN

## 2017-06-08 MED ORDER — PANTOPRAZOLE SODIUM 40 MG IV SOLR
40.0000 mg | Freq: Every day | INTRAVENOUS | Status: DC
  Administered 2017-06-08 – 2017-06-14 (×7): 40 mg via INTRAVENOUS
  Filled 2017-06-08 (×7): qty 40

## 2017-06-08 MED ORDER — METRONIDAZOLE IN NACL 5-0.79 MG/ML-% IV SOLN
500.0000 mg | Freq: Four times a day (QID) | INTRAVENOUS | Status: DC
Start: 2017-06-08 — End: 2017-06-09
  Administered 2017-06-08 – 2017-06-09 (×3): 500 mg via INTRAVENOUS
  Filled 2017-06-08 (×4): qty 100

## 2017-06-08 MED ORDER — POTASSIUM CHLORIDE IN NACL 20-0.45 MEQ/L-% IV SOLN
INTRAVENOUS | Status: DC
  Administered 2017-06-08: 16:00:00 via INTRAVENOUS
  Administered 2017-06-09: 1000 mL via INTRAVENOUS
  Administered 2017-06-12 – 2017-06-13 (×3): via INTRAVENOUS
  Filled 2017-06-08 (×8): qty 1000

## 2017-06-08 NOTE — ED Provider Notes (Signed)
[facility] DEPT Provider Note   CSN: [id number] Arrival date & time: 06/08/17  1113     History   Chief Complaint Chief Complaint  Patient presents with  . Abdominal Pain  . Constipation    HPI James Doyle is a 71 y.o. male.  HPI Patient presents with abdominal pain with possible bowel obstruction. Sent in by general surgery. Has had recent colonoscopy by Dr. 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 Was supposed to follow-up with Dr. Elinor Parkinson were in couple weeks but had more problems so was seen in the office earlier. Has had nausea and decreased oral intake. Has not passed a bowel movement around 4 days. Has passed a little bit of gas. Increasing abdominal pain on the right side. Abdomen is more swollen. Sent in for CT scanner admission. Likely obstruction. Potentially will need surgery. Dr. Marcello Moores is reportedly been notified. No fevers. Past Medical History:  Diagnosis Date  . Arthritis   . H/O vocal cord paralysis    left     Patient Active Problem List   Diagnosis Date Noted  . Colonic mass 06/08/2017  . OA (osteoarthritis) of knee 07/27/2016    Past Surgical History:  Procedure Laterality Date  . BACK SURGERY     times 2  . INNER EAR SURGERY     bilat  . PROSTATE BIOPSY    . TONSILLECTOMY    . TOTAL KNEE ARTHROPLASTY Right 07/27/2016   Procedure: RIGHT TOTAL KNEE ARTHROPLASTY;  Surgeon: Gaynelle Arabian, MD;  Location: [facility] ORS;  Service: Orthopedics;  Laterality: Right;       Home Medications    Prior to Admission medications   Medication Sig Start Date End Date Taking? Authorizing Provider  docusate sodium (COLACE) 100 MG capsule Take 200 mg by mouth 2 (two) times daily as needed for mild constipation.   Yes [provider]  polyethylene glycol (MIRALAX / GLYCOLAX) packet Take 17 g by mouth daily as needed for mild constipation.   Yes [provider]  rivaroxaban (XARELTO) 10 MG TABS tablet Take 1 tablet  (10 mg total) by mouth daily with breakfast. Take Xarelto for two and a half more weeks, then discontinue Xarelto. Once the patient has completed the blood thinner regimen, then take a Baby 81 mg Aspirin daily for three more weeks. Patient not taking: Reported on 06/08/2017 07/28/16   Joelene Millin, PA-C    Family History Family History  Problem Relation Age of Onset  . Heart failure Mother   . Stroke Father     Social History Social History  Substance Use Topics  . Smoking status: Never Smoker  . Smokeless tobacco: Never Used  . Alcohol use No     Allergies   Patient has no known allergies.   Review of Systems Review of Systems  Constitutional: Positive for appetite change. Negative for diaphoresis and fever.  HENT: Negative for congestion.   Eyes: Negative for photophobia.  Respiratory: Negative for shortness of breath.   Gastrointestinal: Positive for abdominal pain, constipation and nausea.  Endocrine: Negative for polyuria.  Genitourinary: Negative for flank pain.  Musculoskeletal: Negative for back pain.  Neurological: Negative for seizures.  Hematological: Negative for adenopathy.  Psychiatric/Behavioral: Negative for confusion.     Physical Exam Updated Vital Signs BP (!) 148/78 (BP Location: Left Arm)   Pulse 80   Temp 98.1 F (36.7 C) (Oral)   Resp 18   Ht 6\' 1"  (1.854 m)   Wt 109.3  kg (241 lb)   SpO2 98%   BMI 31.80 kg/m   Physical Exam  Constitutional: He appears well-developed.  HENT:  Head: Atraumatic.  Eyes: Pupils are equal, round, and reactive to light.  Neck: Neck supple.  Cardiovascular: Normal rate.   Pulmonary/Chest: Effort normal.  Abdominal: He exhibits distension. There is tenderness.  Patient is some distention and right-sided tenderness. No hernia palpated.  Musculoskeletal: He exhibits no edema.  Neurological: He is alert.  Skin: Skin is warm. Capillary refill takes less than 2 seconds.     ED Treatments / Results   Labs (all labs ordered are listed, but only abnormal results are displayed) Labs Reviewed  COMPREHENSIVE METABOLIC PANEL - Abnormal; Notable for the following:       Result Value   Chloride 100 (*)    Glucose, Bld 135 (*)    Creatinine, Ser 1.30 (*)    Total Protein 8.3 (*)    ALT 12 (*)    GFR calc non Af Amer 53 (*)    All other components within normal limits  CBC - Abnormal; Notable for the following:    WBC 19.7 (*)    Hemoglobin 10.8 (*)    HCT 34.5 (*)    MCV 73.2 (*)    MCH 22.9 (*)    All other components within normal limits  URINALYSIS, ROUTINE W REFLEX MICROSCOPIC - Abnormal; Notable for the following:    APPearance HAZY (*)    Protein, ur 30 (*)    Bacteria, UA FEW (*)    All other components within normal limits  LIPASE, BLOOD    EKG  EKG Interpretation None       Radiology No results found.  Procedures Procedures (including critical care time)  Medications Ordered in ED Medications  0.45 % NaCl with KCl 20 mEq / L infusion (not administered)  hydrALAZINE (APRESOLINE) injection 10 mg (not administered)  simethicone (MYLICON) chewable tablet 40 mg (not administered)  pantoprazole (PROTONIX) injection 40 mg (not administered)  ondansetron (ZOFRAN-ODT) disintegrating tablet 4 mg (not administered)    Or  ondansetron (ZOFRAN) injection 4 mg (not administered)  diphenhydrAMINE (BENADRYL) 12.5 MG/5ML elixir 12.5 mg (not administered)    Or  diphenhydrAMINE (BENADRYL) injection 12.5 mg (not administered)  morphine 2 MG/ML injection 1-2 mg (not administered)  iopamidol (ISOVUE-300) 61 % injection 100 mL (not administered)  sodium chloride 0.9 % bolus 1,000 mL (1,000 mLs Intravenous New Bag/Given 06/08/17 1208)  sodium chloride 0.9 % bolus 1,000 mL (1,000 mLs Intravenous New Bag/Given 06/08/17 1208)  ondansetron (ZOFRAN) injection 4 mg (4 mg Intravenous Given 06/08/17 1217)     Initial Impression / Assessment and Plan / ED Course  I have reviewed the  triage vital signs and the nursing notes.  Pertinent labs & imaging results that were available during my care of the patient were reviewed by me and considered in my medical decision making (see chart for details).     Patient with colonic mass and likely bowel obstruction. Sent in by general surgery. Lab work done. CT scan pending. Admitted by general surgery  Final Clinical Impressions(s) / ED Diagnoses   Final diagnoses:  Intestinal obstruction, unspecified cause, unspecified whether partial or complete [facility])  Colonic mass    New Prescriptions New Prescriptions   No medications on file     Davonna Belling, MD 06/08/17 1359

## 2017-06-08 NOTE — ED Notes (Signed)
Report given to [facility] on the 5th, and mad aware that pt would br brought up after his CT scan.

## 2017-06-08 NOTE — ED Notes (Signed)
Pt is aware a urine sample is needed, but is unable to provide one at this time. Urinal at bedside. 

## 2017-06-08 NOTE — Telephone Encounter (Signed)
James Doyle 06/08/2017 9:54 AM Location: [facility] Patient #: [id number] DOB: 1946/11/07 Married / Language: James Doyle / Race: White Male  History of Present Illness James Hollingshead MD; 06/08/2017 10:40 AM) The patient is a 71 year old male.   Note:He is referred by Dr. Penelope Doyle for consultation regarding a near obstructing colon cancer in the descending colon. He was having a follow-up colonoscopy 5 years from previous. He was found to have a large mass in the ascending colon. The colonoscope could not be advanced past it. He underwent a biopsy and according to the family they were called and told it is adenocarcinoma. He has not had a bowel movement since Friday. He is been feeling particularly bad with respect to bloating and nausea vomiting and weakness for the past 2 days. He took 2 packets of MiraLAX yesterday and stool softeners has not had a bowel movement. He felt like he was going to pass out and has had increasing pain since last night. He called the nurse and Dr. Estell Doyle office and she recommended he go to the emergency department last night but he decided not to go and come to this appointment this morning. He normally has a bowel movement every other day.  Past Surgical History (James Doyle, [place]; 06/08/2017 9:55 AM) Colon Polyp Removal - Colonoscopy Knee Surgery Right. Spinal Surgery - Lower Back Spinal Surgery Midback Tonsillectomy  Diagnostic Studies History (James Doyle, [place]; 06/08/2017 9:55 AM) Colonoscopy 1-5 years ago  Allergies (James Doyle, [place]; 06/08/2017 9:57 AM) No Known Drug Allergies 06/08/2017 Allergies Reconciled  Medication History (James Doyle, [place]; 06/08/2017 9:57 AM) No Current Medications Medications Reconciled  Social History (James Doyle, [place]; 06/08/2017 9:55 AM) Caffeine use Tea. No alcohol use No drug use Tobacco use Never smoker.  Family History (James Doyle, [place]; 06/08/2017 9:55  AM) Alcohol Abuse Father. Bleeding disorder Mother. Cerebrovascular Accident Father. Colon Polyps Brother. Diabetes Mellitus Brother. Heart Disease Brother, Mother. Heart disease in male family member before age 52 Heart disease in male family member before age 43 Hypertension Brother, Father, Mother. Migraine Headache Brother, Daughter.  Other Problems (James Doyle, [place]; 06/08/2017 9:55 AM) Arthritis Back Pain Hemorrhoids Other disease, cancer, significant illness     Review of Systems (James A. Brown RMA; 06/08/2017 9:55 AM) General Present- Appetite Loss and Fatigue. Not Present- Chills, Fever, Night Sweats, Weight Gain and Weight Loss. Skin Not Present- Change in Wart/Mole, Dryness, Hives, Jaundice, New Lesions, Non-Healing Wounds, Rash and Ulcer. HEENT Present- Seasonal Allergies and Wears glasses/contact lenses. Not Present- Earache, Hearing Loss, Hoarseness, Nose Bleed, Oral Ulcers, Ringing in the Ears, Sinus Pain, Sore Throat, Visual Disturbances and Yellow Eyes. Respiratory Not Present- Bloody sputum, Chronic Cough, Difficulty Breathing, Snoring and Wheezing. Breast Not Present- Breast Mass, Breast Pain, Nipple Discharge and Skin Changes. Cardiovascular Present- Leg Cramps. Not Present- Chest Pain, Difficulty Breathing Lying Down, Palpitations, Rapid Heart Rate, Shortness of Breath and Swelling of Extremities. Gastrointestinal Present- Abdominal Pain, Bloating, Change in Bowel Habits, Constipation, Excessive gas, Gets full quickly at meals, Indigestion and Nausea. Not Present- Bloody Stool, Chronic diarrhea, Difficulty Swallowing, Hemorrhoids, Rectal Pain and Vomiting. Male Genitourinary Not Present- Blood in Urine, Change in Urinary Stream, Frequency, Impotence, Nocturia, Painful Urination, Urgency and Urine Leakage. Musculoskeletal Present- Back Pain, Joint Pain and Joint Stiffness. Not Present- Muscle Pain, Muscle Weakness and Swelling of  Extremities. Neurological Not Present- Decreased Memory, Fainting, Headaches, Numbness, Seizures, Tingling, Tremor, Trouble walking and Weakness. Psychiatric Not  Present- Anxiety, Bipolar, Change in Sleep Pattern, Depression, Fearful and Frequent crying. Endocrine Not Present- Cold Intolerance, Excessive Hunger, Hair Changes, Heat Intolerance, Hot flashes and New Diabetes. Hematology Not Present- Blood Thinners, Easy Bruising, Excessive bleeding, Gland problems, HIV and Persistent Infections.  Vitals (James A. Brown RMA; 06/08/2017 9:56 AM) 06/08/2017 9:55 AM Weight: 237.4 lb Height: 73in Body Surface Area: 2.31 m Body Mass Index: 31.32 kg/m  Temp.: 98.75F  Pulse: 53 (Regular)  P.OX: 93% (Room air) BP: 102/68 (Sitting, Left Arm, Standard)      Physical Exam James Hollingshead MD; 06/08/2017 10:37 AM)  The physical exam findings are as follows: Note:GENERAL APPEARANCE: Ashen, ill appearing male. Pleasant and cooperative.  EARS, NOSE, MOUTH THROAT: [place]/AT external ears: no lesions or deformities external nose: no lesions or deformities hearing: grossly normal lips: moist, no deformities EYES external: conjunctiva, lids, sclerae normal pupils: equal, round glasses: no  NECK: Supple, no obvious mass or thyroid mass/enlargement, no trachea deviation  CV ascultation: RRR, no murmur extremity edema: no  RESP/CHEST auscultation: breath sounds equal and clear respiratory effort: normal   GASTROINTESTINAL abdomen: Soft, mild RLQ tenderness, with distension, no masses, hypoactive bowel sounds liver and spleen: not enlarged. hernia: none present scar: none present  LYMPHATIC: No palpable cervical, supraclavicular adenopathy.  SKIN jaundice: none rash or lesion: none  NEUROLOGIC speech: normal  PSYCHIATRIC alertness and orientation: normal mood/affect/behavior: normal judgement and insight: normal    Assessment & Plan James Hollingshead MD; 06/08/2017  10:39 AM)  COLON CANCER, ASCENDING (C18.2) Impression: It appears that he is obstructed. He appears severely dehydrated.  Plan: I think he needs go to the emergency department and be evaluated with respect to laboratory results, CT scan, and be aggressively hydrated. Likely he will be need to be admitted to the hospital and may need urgent surgery for what appears to be a large bowel obstruction.  Jackolyn Confer, M.D.

## 2017-06-08 NOTE — H&P (Signed)
[facility] Admission Note  James Doyle May 04, 1946  [id number].    Requesting MD: Alvino Chapel Chief Complaint/Reason for Consult: Colon mass  HPI:  James Doyle is a 71yo male sent to the ED today by Dr. Zella Richer with concern for and obstructing colon mass. Patient states that he uderwent colonoscopy 05/28/17 by Dr. Penelope Coop where he was found to have a large mass in the ascending colon; mass was tagged and biopsied (family was told that it is adenocarcinoma). Patient was referred to Dr. Zella Richer to discuss surgical management.  States that over the last month he has had intermittent crampy lower and right sided abdominal pain. The pain has gotten progressively worse over the last few days. States that his last BM was 7/6 and he is passing little flatus. He reports abdominal distension, nausea no emesis. Denies fever, chills, dysuria. He took stool softeners and 2 packets of miralax yesterday, but has still not had a BM. Last meal was Sunday 7/8, but he was able to tolerate water yesterday.  No significant PMH Abdominal surgical history: none Anticoagulants: none Nonsmoker  ROS: Review of Systems  Constitutional: Negative.   HENT: Negative.   Eyes: Negative.   Respiratory: Negative.   Cardiovascular: Negative.   Gastrointestinal: Positive for abdominal pain, constipation and nausea. Negative for diarrhea and vomiting.  Genitourinary: Negative.   Musculoskeletal: Negative.   Skin: Negative.   Neurological: Negative.   All systems reviewed and otherwise negative except for as above  Family History  Problem Relation Age of Onset  . Heart failure Mother   . Stroke Father     Past Medical History:  Diagnosis Date  . Arthritis   . H/O vocal cord paralysis    left     Past Surgical History:  Procedure Laterality Date  . BACK SURGERY     times 2  . INNER EAR SURGERY     bilat  . PROSTATE BIOPSY    . TONSILLECTOMY    . TOTAL KNEE ARTHROPLASTY Right  07/27/2016   Procedure: RIGHT TOTAL KNEE ARTHROPLASTY;  Surgeon: Gaynelle Arabian, MD;  Location: [facility];  Service: Orthopedics;  Laterality: Right;    Social History:  reports that he has never smoked. He has never used smokeless tobacco. He reports that he does not drink alcohol or use drugs.  Allergies: No Known Allergies   (Not in a hospital admission)  Prior to Admission medications   Medication Sig Start Date End Date Taking? Authorizing Provider  methocarbamol (ROBAXIN) 500 MG tablet Take 1 tablet (500 mg total) by mouth every 6 (six) hours as needed for muscle spasms. 07/28/16   Perkins, Alexzandrew L, PA-C  oxyCODONE (OXY IR/ROXICODONE) 5 MG immediate release tablet Take 1-2 tablets (5-10 mg total) by mouth every 3 (three) hours as needed for moderate pain or severe pain. 07/28/16   Perkins, Alexzandrew L, PA-C  rivaroxaban (XARELTO) 10 MG TABS tablet Take 1 tablet (10 mg total) by mouth daily with breakfast. Take Xarelto for two and a half more weeks, then discontinue Xarelto. Once the patient has completed the blood thinner regimen, then take a Baby 81 mg Aspirin daily for three more weeks. 07/28/16   Perkins, Alexzandrew L, PA-C    Blood pressure 140/77, pulse 91, temperature 98.4 F (36.9 C), temperature source Oral, resp. rate 16, height 6' 1"  (1.854 m), weight 241 lb (109.3 kg), SpO2 100 %. Physical Exam: General: pleasant, WD/WN white male who is laying in bed in NAD HEENT: head is normocephalic,  atraumatic.  Sclera are noninjected.  Pupils equal and round.  Ears and nose without any masses or lesions.  Mouth is pink and moist. Dentition fair Heart: regular, rate, and rhythm.  No obvious murmurs, gallops, or rubs noted.  Palpable pedal pulses bilaterally Lungs: CTAB, no wheezes, rhonchi, or rales noted.  Respiratory effort nonlabored Abd: soft, moderate distension, +BS, no masses, hernias, or organomegaly. Mild RUQ and RLQ tenderness MS: all 4 extremities are symmetrical with no  cyanosis, clubbing, or edema. Skin: warm and dry with no masses, lesions, or rashes Psych: A&Ox3 with an appropriate affect. Neuro: cranial nerves grossly intact, extremity CSM intact bilaterally, normal speech  No results found for this or any previous visit (from the past 48 hour(s)). No results found.    Assessment/Plan Obstructing colon mass - patient underwent colonoscopy 05/28/17 by Dr. Penelope Coop where he was found to have a large mass in the ascending colon; mass was tagged and biopsied - called and received path report from [facility]: "Invasive moderately differentiated adenocarcinoma; MSI testing pending" - last BM was 7/6, passing little flatus - CT scan pending  AKI - Cr 1.3, continue IVF  ID - none VTE - SCDs FEN - IVF, NPO  Plan - Admit to med-surg. NPO, bowel rest, IVF resuscitation. Will await CT and lab results.  Jerrye Beavers, [facility] 06/08/2017, 1:00 PM Pager: [phone] Consults: [phone] Mon-Fri 7:00 am-4:30 pm Sat-Sun 7:00 am-11:30 am

## 2017-06-08 NOTE — ED Notes (Signed)
Pt is alert and oriented x 4 and verbally responsive. Pt is accompany with spouse. Pt states that he has been told that he had a bowel blockage, and has been having right side abdominal pain and is tender to touch. Pt states that he has not had a BM x4 quads but does report flatulence. Abdomen is distended RUQ is hypo active BS and + BS x 3 quads.

## 2017-06-08 NOTE — ED Triage Notes (Signed)
Patient was seen by Dr. Zella Richer today and was told to come to the ED for possible bowel blockage. Patient has not had a BM in 4 days.Patient was seeing Dr. Zella Richer for cancer/surgery consult.

## 2017-06-08 NOTE — ED Notes (Signed)
Pt being sent from [facility] d/t "near obstructing" mass in ascending colon.  C/o constipation x 4 days, dizziness, and unable to tolerate intake.  Ola Spurr MD reports Elinor Parkinson MD has notified "his partner which is at [facility] today."

## 2017-06-09 ENCOUNTER — Observation Stay (HOSPITAL_COMMUNITY): Payer: Medicare Other | Admitting: Anesthesiology

## 2017-06-09 ENCOUNTER — Encounter (HOSPITAL_COMMUNITY): Admission: EM | Disposition: A | Discharge status: Home Health | Payer: Self-pay | Source: Home / Self Care

## 2017-06-09 DIAGNOSIS — Z7901 Long term (current) use of anticoagulants: Secondary | ICD-10-CM | POA: Diagnosis not present

## 2017-06-09 DIAGNOSIS — K639 Disease of intestine, unspecified: Secondary | ICD-10-CM | POA: Diagnosis present

## 2017-06-09 DIAGNOSIS — E669 Obesity, unspecified: Secondary | ICD-10-CM | POA: Diagnosis present

## 2017-06-09 DIAGNOSIS — D509 Iron deficiency anemia, unspecified: Secondary | ICD-10-CM | POA: Diagnosis present

## 2017-06-09 DIAGNOSIS — C184 Malignant neoplasm of transverse colon: Secondary | ICD-10-CM | POA: Diagnosis present

## 2017-06-09 DIAGNOSIS — Z96651 Presence of right artificial knee joint: Secondary | ICD-10-CM | POA: Diagnosis present

## 2017-06-09 DIAGNOSIS — Z6831 Body mass index (BMI) 31.0-31.9, adult: Secondary | ICD-10-CM | POA: Diagnosis not present

## 2017-06-09 DIAGNOSIS — Z7982 Long term (current) use of aspirin: Secondary | ICD-10-CM | POA: Diagnosis not present

## 2017-06-09 DIAGNOSIS — N179 Acute kidney failure, unspecified: Secondary | ICD-10-CM | POA: Diagnosis present

## 2017-06-09 HISTORY — PX: LAPAROSCOPY: SHX197

## 2017-06-09 LAB — BASIC METABOLIC PANEL
ANION GAP: 9 (ref 5–15)
BUN: 15 mg/dL (ref 6–20)
CALCIUM: 8.4 mg/dL — AB (ref 8.9–10.3)
CHLORIDE: 106 mmol/L (ref 101–111)
CO2: 24 mmol/L (ref 22–32)
Creatinine, Ser: 1.14 mg/dL (ref 0.61–1.24)
GFR calc non Af Amer: >60 mL/min (ref >60)
GLUCOSE: 99 mg/dL (ref 65–99)
POTASSIUM: 4.1 mmol/L (ref 3.5–5.1)
Sodium: 139 mmol/L (ref 135–145)

## 2017-06-09 LAB — TYPE AND SCREEN
ABO/RH(D): O POS
ANTIBODY SCREEN: NEGATIVE

## 2017-06-09 LAB — CBC
HEMATOCRIT: 27.7 % — AB (ref 39.0–52.0)
HEMOGLOBIN: 8.6 g/dL — AB (ref 13.0–17.0)
MCH: 23 pg — ABNORMAL LOW (ref 26.0–34.0)
MCHC: 31 g/dL (ref 30.0–36.0)
MCV: 74.1 fL — AB (ref 78.0–100.0)
Platelets: 170 10*3/uL (ref 150–400)
RBC: 3.74 MIL/uL — AB (ref 4.22–5.81)
RDW: 15.5 % (ref 11.5–15.5)
WBC: 9.9 10*3/uL (ref 4.0–10.5)

## 2017-06-09 LAB — SURGICAL PCR SCREEN
MRSA, PCR: NEGATIVE
STAPHYLOCOCCUS AUREUS: NEGATIVE

## 2017-06-09 SURGERY — LAPAROSCOPY, DIAGNOSTIC
Anesthesia: General | Site: Abdomen

## 2017-06-09 MED ORDER — MEPERIDINE HCL 50 MG/ML IJ SOLN: 6.2500 mg | INTRAMUSCULAR | Status: DC | PRN

## 2017-06-09 MED ORDER — LACTATED RINGERS IV SOLN
INTRAVENOUS | Status: DC
  Administered 2017-06-09: 14:00:00 via INTRAVENOUS

## 2017-06-09 MED ORDER — MIDAZOLAM HCL 5 MG/5ML IJ SOLN
INTRAMUSCULAR | Status: DC | PRN
  Administered 2017-06-09: 2 mg via INTRAVENOUS

## 2017-06-09 MED ORDER — 0.9 % SODIUM CHLORIDE (POUR BTL) OPTIME
TOPICAL | Status: DC | PRN
  Administered 2017-06-09: 1000 mL

## 2017-06-09 MED ORDER — MORPHINE SULFATE (PF) 4 MG/ML IV SOLN
2.0000 mg | INTRAVENOUS | Status: DC | PRN
  Administered 2017-06-09: 2 mg via INTRAVENOUS
  Administered 2017-06-10: 3 mg via INTRAVENOUS
  Administered 2017-06-10: 2 mg via INTRAVENOUS
  Filled 2017-06-09 (×3): qty 1

## 2017-06-09 MED ORDER — LIDOCAINE 2% (20 MG/ML) 5 ML SYRINGE
INTRAMUSCULAR | Status: DC | PRN
  Administered 2017-06-09: 100 mg via INTRAVENOUS

## 2017-06-09 MED ORDER — ROCURONIUM BROMIDE 10 MG/ML (PF) SYRINGE
PREFILLED_SYRINGE | INTRAVENOUS | Status: DC | PRN
  Administered 2017-06-09: 10 mg via INTRAVENOUS
  Administered 2017-06-09: 40 mg via INTRAVENOUS
  Administered 2017-06-09: 20 mg via INTRAVENOUS
  Administered 2017-06-09: 10 mg via INTRAVENOUS
  Administered 2017-06-09: 20 mg via INTRAVENOUS

## 2017-06-09 MED ORDER — GLYCOPYRROLATE 0.2 MG/ML IV SOSY
PREFILLED_SYRINGE | INTRAVENOUS | Status: AC
  Filled 2017-06-09: qty 5

## 2017-06-09 MED ORDER — ROCURONIUM BROMIDE 50 MG/5ML IV SOSY
PREFILLED_SYRINGE | INTRAVENOUS | Status: AC
  Filled 2017-06-09: qty 5

## 2017-06-09 MED ORDER — CEFOTETAN DISODIUM-DEXTROSE 2-2.08 GM-% IV SOLR
INTRAVENOUS | Status: AC
  Filled 2017-06-09: qty 50

## 2017-06-09 MED ORDER — HYDRALAZINE HCL 20 MG/ML IJ SOLN
INTRAMUSCULAR | Status: AC
  Filled 2017-06-09: qty 1

## 2017-06-09 MED ORDER — HYDROMORPHONE HCL-NACL 0.5-0.9 MG/ML-% IV SOSY
PREFILLED_SYRINGE | INTRAVENOUS | Status: AC
  Filled 2017-06-09: qty 2

## 2017-06-09 MED ORDER — CIPROFLOXACIN IN D5W 400 MG/200ML IV SOLN
INTRAVENOUS | Status: AC
  Filled 2017-06-09: qty 200

## 2017-06-09 MED ORDER — SODIUM CHLORIDE 0.9 % IJ SOLN
INTRAMUSCULAR | Status: AC
  Filled 2017-06-09: qty 20

## 2017-06-09 MED ORDER — SUCCINYLCHOLINE CHLORIDE 200 MG/10ML IV SOSY
PREFILLED_SYRINGE | INTRAVENOUS | Status: AC
  Filled 2017-06-09: qty 10

## 2017-06-09 MED ORDER — FENTANYL CITRATE (PF) 100 MCG/2ML IJ SOLN
INTRAMUSCULAR | Status: DC | PRN
  Administered 2017-06-09 (×2): 50 ug via INTRAVENOUS
  Administered 2017-06-09: 100 ug via INTRAVENOUS
  Administered 2017-06-09: 50 ug via INTRAVENOUS

## 2017-06-09 MED ORDER — LACTATED RINGERS IR SOLN
Status: DC | PRN
  Administered 2017-06-09: 1000 mL

## 2017-06-09 MED ORDER — HYDROMORPHONE HCL 1 MG/ML IJ SOLN
INTRAMUSCULAR | Status: DC | PRN
Start: 2017-06-09 — End: 2017-06-09
  Administered 2017-06-09 (×4): 0.5 mg via INTRAVENOUS

## 2017-06-09 MED ORDER — LIDOCAINE 2% (20 MG/ML) 5 ML SYRINGE
INTRAMUSCULAR | Status: AC
  Filled 2017-06-09: qty 5

## 2017-06-09 MED ORDER — SODIUM CHLORIDE 0.9 % IJ SOLN
INTRAMUSCULAR | Status: AC
  Filled 2017-06-09: qty 10

## 2017-06-09 MED ORDER — SUGAMMADEX SODIUM 200 MG/2ML IV SOLN: INTRAVENOUS | Status: DC | PRN

## 2017-06-09 MED ORDER — SUCCINYLCHOLINE CHLORIDE 200 MG/10ML IV SOSY
PREFILLED_SYRINGE | INTRAVENOUS | Status: DC | PRN
  Administered 2017-06-09: 120 mg via INTRAVENOUS

## 2017-06-09 MED ORDER — OXYCODONE HCL 5 MG PO TABS: 5.0000 mg | ORAL_TABLET | Freq: Once | ORAL | Status: DC | PRN

## 2017-06-09 MED ORDER — HEMOSTATIC AGENTS (NO CHARGE) OPTIME
TOPICAL | Status: DC | PRN
  Administered 2017-06-09: 1 via TOPICAL

## 2017-06-09 MED ORDER — MIDAZOLAM HCL 2 MG/2ML IJ SOLN
INTRAMUSCULAR | Status: AC
  Filled 2017-06-09: qty 2

## 2017-06-09 MED ORDER — LABETALOL HCL 5 MG/ML IV SOLN
INTRAVENOUS | Status: DC | PRN
  Administered 2017-06-09 (×3): 5 mg via INTRAVENOUS

## 2017-06-09 MED ORDER — LACTATED RINGERS IV SOLN
INTRAVENOUS | Status: DC | PRN
  Administered 2017-06-09 (×2): via INTRAVENOUS

## 2017-06-09 MED ORDER — PROMETHAZINE HCL 25 MG/ML IJ SOLN: 6.2500 mg | INTRAMUSCULAR | Status: DC | PRN

## 2017-06-09 MED ORDER — CHLORHEXIDINE GLUCONATE CLOTH 2 % EX PADS: 6.0000 | MEDICATED_PAD | Freq: Once | CUTANEOUS | Status: DC

## 2017-06-09 MED ORDER — LACTATED RINGERS IV SOLN
INTRAVENOUS | Status: DC | PRN
  Administered 2017-06-09: 09:00:00 via INTRAVENOUS

## 2017-06-09 MED ORDER — OXYCODONE HCL 5 MG/5ML PO SOLN
5.0000 mg | Freq: Once | ORAL | Status: DC | PRN
  Filled 2017-06-09: qty 5

## 2017-06-09 MED ORDER — PROPOFOL 10 MG/ML IV BOLUS
INTRAVENOUS | Status: DC | PRN
  Administered 2017-06-09: 140 mg via INTRAVENOUS

## 2017-06-09 MED ORDER — PROPOFOL 10 MG/ML IV BOLUS
INTRAVENOUS | Status: AC
  Filled 2017-06-09: qty 20

## 2017-06-09 MED ORDER — FENTANYL CITRATE (PF) 250 MCG/5ML IJ SOLN
INTRAMUSCULAR | Status: AC
  Filled 2017-06-09: qty 5

## 2017-06-09 MED ORDER — HYDROMORPHONE HCL 2 MG/ML IJ SOLN
INTRAMUSCULAR | Status: AC
  Filled 2017-06-09: qty 1

## 2017-06-09 MED ORDER — DEXTROSE 5 % IV SOLN
2.0000 g | INTRAVENOUS | Status: AC
  Administered 2017-06-09: 2 g via INTRAVENOUS
  Filled 2017-06-09: qty 2

## 2017-06-09 MED ORDER — BUPIVACAINE-EPINEPHRINE (PF) 0.25% -1:200000 IJ SOLN
INTRAMUSCULAR | Status: AC
  Filled 2017-06-09: qty 30

## 2017-06-09 MED ORDER — LABETALOL HCL 5 MG/ML IV SOLN
INTRAVENOUS | Status: AC
  Filled 2017-06-09: qty 4

## 2017-06-09 MED ORDER — SUGAMMADEX SODIUM 500 MG/5ML IV SOLN
INTRAVENOUS | Status: DC | PRN
  Administered 2017-06-09: 220 mg via INTRAVENOUS

## 2017-06-09 MED ORDER — EPHEDRINE SULFATE-NACL 50-0.9 MG/10ML-% IV SOSY
PREFILLED_SYRINGE | INTRAVENOUS | Status: DC | PRN
  Administered 2017-06-09: 10 mg via INTRAVENOUS
  Administered 2017-06-09: 5 mg via INTRAVENOUS

## 2017-06-09 MED ORDER — HYDROMORPHONE HCL-NACL 0.5-0.9 MG/ML-% IV SOSY
0.2500 mg | PREFILLED_SYRINGE | INTRAVENOUS | Status: DC | PRN
  Administered 2017-06-09: 0.5 mg via INTRAVENOUS

## 2017-06-09 SURGICAL SUPPLY — 66 items
APPLIER CLIP 5 13 M/L LIGAMAX5 (MISCELLANEOUS)
APPLIER CLIP ROT 10 11.4 M/L (STAPLE)
BARRIER SKIN 2 3/4 (OSTOMY) ×2 IMPLANT
BLADE EXTENDED COATED 6.5IN (ELECTRODE) IMPLANT
CLIP APPLIE 5 13 M/L LIGAMAX5 (MISCELLANEOUS) IMPLANT
CLIP APPLIE ROT 10 11.4 M/L (STAPLE) IMPLANT
COVER MAYO STAND STRL (DRAPES) ×2 IMPLANT
DECANTER SPIKE VIAL GLASS SM (MISCELLANEOUS) ×2 IMPLANT
DERMABOND ADVANCED (GAUZE/BANDAGES/DRESSINGS) ×2
DERMABOND ADVANCED .7 DNX12 (GAUZE/BANDAGES/DRESSINGS) ×2 IMPLANT
DRSG OPSITE POSTOP 4X6 (GAUZE/BANDAGES/DRESSINGS) ×2 IMPLANT
DRSG OPSITE POSTOP 4X8 (GAUZE/BANDAGES/DRESSINGS) ×2 IMPLANT
ELECT PENCIL ROCKER SW 15FT (MISCELLANEOUS) IMPLANT
ELECT REM PT RETURN 15FT ADLT (MISCELLANEOUS) ×2 IMPLANT
GAUZE SPONGE 4X4 12PLY STRL (GAUZE/BANDAGES/DRESSINGS) ×2 IMPLANT
GLOVE BIO SURGEON STRL SZ 6 (GLOVE) ×2 IMPLANT
GLOVE BIO SURGEON STRL SZ 6.5 (GLOVE) ×4 IMPLANT
GLOVE BIOGEL PI IND STRL 6 (GLOVE) ×1 IMPLANT
GLOVE BIOGEL PI IND STRL 6.5 (GLOVE) ×1 IMPLANT
GLOVE BIOGEL PI IND STRL 7.0 (GLOVE) ×5 IMPLANT
GLOVE BIOGEL PI IND STRL 7.5 (GLOVE) ×2 IMPLANT
GLOVE BIOGEL PI INDICATOR 6 (GLOVE) ×1
GLOVE BIOGEL PI INDICATOR 6.5 (GLOVE) ×1
GLOVE BIOGEL PI INDICATOR 7.0 (GLOVE) ×5
GLOVE BIOGEL PI INDICATOR 7.5 (GLOVE) ×2
GOWN SRG XL LVL 4 BRTHBL STRL (GOWNS) ×1 IMPLANT
GOWN STRL NON-REIN XL LVL4 (GOWNS) ×1
GOWN STRL REUS W/TWL 2XL LVL3 (GOWN DISPOSABLE) ×4 IMPLANT
GOWN STRL REUS W/TWL LRG LVL3 (GOWN DISPOSABLE) ×2 IMPLANT
GOWN STRL REUS W/TWL XL LVL3 (GOWN DISPOSABLE) ×2 IMPLANT
HANDLE STAPLE EGIA 4 XL (STAPLE) ×2 IMPLANT
HANDLE SUCTION POOLE (INSTRUMENTS) ×1 IMPLANT
IRRIG SUCT STRYKERFLOW 2 WTIP (MISCELLANEOUS) ×2
IRRIGATION SUCT STRKRFLW 2 WTP (MISCELLANEOUS) ×1 IMPLANT
PACK COLON (CUSTOM PROCEDURE TRAY) ×2 IMPLANT
PORT LAP GEL ALEXIS MED 5-9CM (MISCELLANEOUS) ×2 IMPLANT
POUCH OSTOMY 2 3/4  H 3804 (WOUND CARE) ×1
POUCH OSTOMY 2 PC DRNBL 2.75 (WOUND CARE) ×1 IMPLANT
RELOAD EGIA 60 MED/THCK PURPLE (STAPLE) ×8 IMPLANT
SEALER TISSUE G2 STRG ARTC 35C (ENDOMECHANICALS) ×2 IMPLANT
SEALER TISSUE X1 CVD JAW (INSTRUMENTS) IMPLANT
SEPRAFILM PROCEDURAL PACK 3X5 (MISCELLANEOUS) ×2 IMPLANT
SET IRRIG TUBING LAPAROSCOPIC (IRRIGATION / IRRIGATOR) ×2 IMPLANT
SLEEVE XCEL OPT CAN 5 100 (ENDOMECHANICALS) ×2 IMPLANT
SPONGE LAP 18X18 X RAY DECT (DISPOSABLE) IMPLANT
STAPLER VISISTAT 35W (STAPLE) IMPLANT
SUCTION POOLE HANDLE (INSTRUMENTS) ×2
SUT NOVA 1 T20/GS 25DT (SUTURE) IMPLANT
SUT PDS AB 1 TP1 96 (SUTURE) IMPLANT
SUT PROLENE 2 0 KS (SUTURE) IMPLANT
SUT PROLENE 2 0 SH DA (SUTURE) IMPLANT
SUT SILK 2 0 (SUTURE) ×1
SUT SILK 2 0 SH CR/8 (SUTURE) ×2 IMPLANT
SUT SILK 2-0 18XBRD TIE 12 (SUTURE) ×1 IMPLANT
SUT SILK 3 0 (SUTURE) ×1
SUT SILK 3 0 SH CR/8 (SUTURE) ×4 IMPLANT
SUT SILK 3-0 18XBRD TIE 12 (SUTURE) ×1 IMPLANT
SUT VIC AB 2-0 SH 18 (SUTURE) ×4 IMPLANT
SUT VIC AB 2-0 SH 27 (SUTURE) ×1
SUT VIC AB 2-0 SH 27X BRD (SUTURE) ×1 IMPLANT
TOWEL OR 17X26 10 PK STRL BLUE (TOWEL DISPOSABLE) IMPLANT
TOWEL OR NON WOVEN STRL DISP B (DISPOSABLE) ×2 IMPLANT
TRAY FOLEY W/METER SILVER 16FR (SET/KITS/TRAYS/PACK) ×2 IMPLANT
TROCAR BLADELESS OPT 5 100 (ENDOMECHANICALS) ×2 IMPLANT
TROCAR XCEL BLUNT TIP 100MML (ENDOMECHANICALS) IMPLANT
TROCAR XCEL NON-BLD 11X100MML (ENDOMECHANICALS) IMPLANT

## 2017-06-09 NOTE — Op Note (Signed)
06/08/2017 - 06/09/2017  1:29 PM  PATIENT:  James Doyle  71 y.o. male  Patient Care Team: Alroy Dust, L.Marlou Sa, MD as PCP - General (Family Medicine)  PRE-OPERATIVE DIAGNOSIS:  obstructive colon mass  POST-OPERATIVE DIAGNOSIS:  obstructive L transverse colon mass  PROCEDURE:  LAPAROSCOPIC ASSISTED  LEFT HEMICOLECTOMY   Surgeon(s): Leighton Ruff, MD  ASSISTANT: none   ANESTHESIA:   general  EBL:  Total I/O In: 1000 [I.V.:1000] Out: 475 [Urine:475]  DRAINS: none   SPECIMEN:  Source of Specimen:  L colon  DISPOSITION OF SPECIMEN:  PATHOLOGY  COUNTS:  YES  PLAN OF CARE: inpatient  PATIENT DISPOSITION:  PACU - hemodynamically stable.  INDICATION: 71 y.o. M who presented to the emergency department with an obstructing colon. CT scan showed a left-sided mass near the splenic flexure.   OR FINDINGS: Left-sided transverse colon mass with obvious stricture. Tattoo noted at this area  DESCRIPTION: the patient was identified in the preoperative holding area and taken to the OR where they were laid supine on the operating room table.  General anesthesia was induced without difficulty. SCDs were also noted to be in place prior to the initiation of anesthesia. A Foley catheter was inserted under sterile conditions. The patient was then prepped and draped in the usual sterile fashion.   A surgical timeout was performed indicating the correct patient, procedure, positioning and need for preoperative antibiotics. After this was completed a supraumbilical incision was made with a 15 blade scalpel. Dissection carried down through subcutaneous tissue using blunt dissection. The fascia was elevated with 2 Coker clamps. The fascia was incised. A 0 Vicryl stay suture was placed and a Hassan port was placed into the abdomen without difficulty. The abdomen was insufflated to approximately 15 mmHg. The abdomen was evaluated. The right side of the colon and transverse colon were very distended. There  was obvious tattoo on the left transverse colon. The liver and spleen appear normal although I could not get good visualization due to the colon distention. The small bowel and pelvis appear normal as well. Given that the mass was in the left transverse colon I decided to perform a left hemicolectomy. I began by separating the omentum from the left colon using the Enseal device. After this was completed I began to take down the splenic flexure taking the splenocolic ligament with the Enseal device as well. I was able then bluntly mobilized the rest of the splenic flexure off of the retroperitoneum. An indeterminate lesion to the patient's pelvis. Identified the rectosigmoid junction and entered underneath this through the mesentery. Neck dissected out the mesenteric plane and identified the left ureter. I dissected the structures down and continued to mobilize to the left side of the colon. The left pelvic sidewall attachments were taken down using blunt gunshot dissection. After confirming that the left ureter was out of the way the IMA was transected using the laparoscopic in sealed device. The sigmoid vein was transected in similar fashion. I then divided the mesentery at the rectosigmoid junction using the Enseal device. I used 2 purple covidian staplers to transect the rectosigmoid laparoscopically.  After this was completed I mobilized the left side of the colon off of the white line of T[place] all the way to my previous splenic flexure dissection. The remaining retroperitoneal and mesentery attachments were taken down using the Enseal device. The omentum was mobilized off of the colon to the ligament of Treitz. I divided the IMV with the Enseal device as well as  an continued my dissection of the mesentery to the level of the middle colic artery. The left branch of the middle colic was taken with the mesentery to the colon. Once this was completed, and the entire specimen was mobilized and made a lower midline  incision and connected this to my umbilical incision. The colon was brought out of the abdomen and the proximal colon was transected approximately 5 cm proximal to the mass using a purple staple load 2. The specimen was then sent off the field. The remaining colon was mobilized to the left upper quadrant and an ostomy site was identified. The skin was transected with electrocautery. Subcutaneous tissue was removed and the fascia was incised in a cruciate manner. The rectus muscle was split and the peritoneum was entered using the electrocautery. The ostomy aperture was dilated to approximately 2 fingerbreadths. The remaining colon was brought out through this site. I then decompressed the right side of the colon using a suction device through the distal portion of the remaining transverse colon. After this we switched to clean gowns and gloves and began to close the midline fascia. This was done using a running 2-0 Vicryl suture to close the peritoneum after Seprafilm was applied. After that the fascia was closed using 2 running #1 PDS sutures. The subcutaneous tissue was reapproximated using interrupted 2-0 Vicryl sutures. The skin was closed with staples. Staples were also placed in the 2 laparoscopic port sites.  Once this was completed, the wound was covered and the ostomy was matured in standard [place] fashion  Using interrupted 2-0 Vicryl sutures. The ostomy appeared patent and did not appear ischemic. An ostomy appliance was placed as well as a sterile dressing on the midline incision.  The patient was awakened from anesthesia and sent to the postanesthesia care unit in stable condition. All counts were correct per operating room staff.

## 2017-06-09 NOTE — Transfer of Care (Signed)
Immediate Anesthesia Transfer of Care Note  Patient: James Doyle  Procedure(s) Performed: Procedure(s): LAPAROSCOPY DIAGNOSTIC, OPEN  LEFT COLECTOMY (N/A)  Patient Location: PACU  Anesthesia Type:General  Level of Consciousness:  sedated, patient cooperative and responds to stimulation  Airway & Oxygen Therapy:Patient Spontanous Breathing and Patient connected to face mask oxgen  Post-op Assessment:  Report given to PACU RN and Post -op Vital signs reviewed and stable  Post vital signs:  Reviewed and stable  Last Vitals:  Vitals:   06/09/17 0547 06/09/17 0848  BP: 128/71 126/86  Pulse: 64   Resp: 18 19  Temp: 37.1 C 53.2 C    Complications: No apparent anesthesia complications

## 2017-06-09 NOTE — Anesthesia Preprocedure Evaluation (Addendum)
Anesthesia Evaluation  Patient identified by MRN, date of birth, ID band Patient awake    Reviewed: Allergy & Precautions, H&P , Patient's Chart, lab work & pertinent test results  Airway Mallampati: II  TM Distance: >3 FB Neck ROM: full    Dental no notable dental hx.    Pulmonary    Pulmonary exam normal breath sounds clear to auscultation       Cardiovascular Exercise Tolerance: Good  Rhythm:regular Rate:Normal     Neuro/Psych    GI/Hepatic Neg liver ROS, Colon mass c obstruction   Endo/Other    Renal/GU negative Renal ROS     Musculoskeletal  (+) Arthritis ,   Abdominal (+) + obese,   Peds  Hematology  (+) anemia , Hct now 27.0   Anesthesia Other Findings   Reproductive/Obstetrics                            Anesthesia Physical  Anesthesia Plan  ASA: III  Anesthesia Plan: General   Post-op Pain Management:    Induction: Intravenous  PONV Risk Score and Plan: 4 or greater and Ondansetron, Dexamethasone, Propofol, Midazolam and Treatment may vary due to age or medical condition  Airway Management Planned: Oral ETT  Additional Equipment:   Intra-op Plan:   Post-operative Plan: Extubation in OR  Informed Consent: I have reviewed the patients History and Physical, chart, labs and discussed the procedure including the risks, benefits and alternatives for the proposed anesthesia with the patient or authorized representative who has indicated his/her understanding and acceptance.   Dental Advisory Given  Plan Discussed with: CRNA  Anesthesia Plan Comments:        Anesthesia Quick Evaluation

## 2017-06-09 NOTE — Progress Notes (Signed)
Subjective: Pt feeling about the same.  Min flatus  Objective: Vital signs in last 24 hours: Temp:  [98.1 F (36.7 C)-99.1 F (37.3 C)] 98.8 F (37.1 C) (07/11 0848) Pulse Rate:  [64-91] 64 (07/11 0547) Resp:  [16-20] 19 (07/11 0848) BP: (124-157)/(61-86) 126/86 (07/11 0848) SpO2:  [94 %-100 %] 98 % (07/11 0848) Weight:  [109.3 kg (241 lb)] 109.3 kg (241 lb) (07/10 1131) Last BM Date: 06/03/17  Intake/Output from previous day: 07/10 0701 - 07/11 0700 In: 4224.2 [I.V.:1724.2; IV Piggyback:2500] Out: 550 [Urine:550] Intake/Output this shift: Total I/O In: -  Out: 325 [Urine:325]  General appearance: alert and cooperative Resp: clear to auscultation bilaterally GI: normal findings: soft, non-tender  Lab Results:  Results for orders placed or performed during the hospital encounter of 06/08/17 (from the past 24 hour(s))  Lipase, blood     Status: None   Collection Time: 06/08/17 11:58 AM  Result Value Ref Range   Lipase 19 11 - 51 U/L  Comprehensive metabolic panel     Status: Abnormal   Collection Time: 06/08/17 11:58 AM  Result Value Ref Range   Sodium 136 135 - 145 mmol/L   Potassium 3.5 3.5 - 5.1 mmol/L   Chloride 100 (L) 101 - 111 mmol/L   CO2 25 22 - 32 mmol/L   Glucose, Bld 135 (H) 65 - 99 mg/dL   BUN 16 6 - 20 mg/dL   Creatinine, Ser 1.30 (H) 0.61 - 1.24 mg/dL   Calcium 9.5 8.9 - 10.3 mg/dL   Total Protein 8.3 (H) 6.5 - 8.1 g/dL   Albumin 4.4 3.5 - 5.0 g/dL   AST 18 15 - 41 U/L   ALT 12 (L) 17 - 63 U/L   Alkaline Phosphatase 97 38 - 126 U/L   Total Bilirubin 1.1 0.3 - 1.2 mg/dL   GFR calc non Af Amer 53 (L) >60 mL/min   GFR calc Af Amer >60 >60 mL/min   Anion gap 11 5 - 15  CBC     Status: Abnormal   Collection Time: 06/08/17 11:58 AM  Result Value Ref Range   WBC 19.7 (H) 4.0 - 10.5 K/uL   RBC 4.71 4.22 - 5.81 MIL/uL   Hemoglobin 10.8 (L) 13.0 - 17.0 g/dL   HCT 34.5 (L) 39.0 - 52.0 %   MCV 73.2 (L) 78.0 - 100.0 fL   MCH 22.9 (L) 26.0 - 34.0 pg    MCHC 31.3 30.0 - 36.0 g/dL   RDW 15.1 11.5 - 15.5 %   Platelets 221 150 - 400 K/uL  Urinalysis, Routine w reflex microscopic     Status: Abnormal   Collection Time: 06/08/17  1:20 PM  Result Value Ref Range   Color, Urine YELLOW YELLOW   APPearance HAZY (A) CLEAR   Specific Gravity, Urine 1.020 1.005 - 1.030   pH 5.0 5.0 - 8.0   Glucose, UA NEGATIVE NEGATIVE mg/dL   Hgb urine dipstick NEGATIVE NEGATIVE   Bilirubin Urine NEGATIVE NEGATIVE   Ketones, ur NEGATIVE NEGATIVE mg/dL   Protein, ur 30 (A) NEGATIVE mg/dL   Nitrite NEGATIVE NEGATIVE   Leukocytes, UA NEGATIVE NEGATIVE   RBC / HPF 0-5 0 - 5 RBC/hpf   WBC, UA 0-5 0 - 5 WBC/hpf   Bacteria, UA FEW (A) NONE SEEN   Squamous Epithelial / LPF NONE SEEN NONE SEEN   Mucous PRESENT   CBC     Status: Abnormal   Collection Time: 06/09/17  6:59 AM  Result  Value Ref Range   WBC 9.9 4.0 - 10.5 K/uL   RBC 3.74 (L) 4.22 - 5.81 MIL/uL   Hemoglobin 8.6 (L) 13.0 - 17.0 g/dL   HCT 27.7 (L) 39.0 - 52.0 %   MCV 74.1 (L) 78.0 - 100.0 fL   MCH 23.0 (L) 26.0 - 34.0 pg   MCHC 31.0 30.0 - 36.0 g/dL   RDW 15.5 11.5 - 15.5 %   Platelets 170 150 - 400 K/uL  Basic metabolic panel     Status: Abnormal   Collection Time: 06/09/17  6:59 AM  Result Value Ref Range   Sodium 139 135 - 145 mmol/L   Potassium 4.1 3.5 - 5.1 mmol/L   Chloride 106 101 - 111 mmol/L   CO2 24 22 - 32 mmol/L   Glucose, Bld 99 65 - 99 mg/dL   BUN 15 6 - 20 mg/dL   Creatinine, Ser 1.14 0.61 - 1.24 mg/dL   Calcium 8.4 (L) 8.9 - 10.3 mg/dL   GFR calc non Af Amer >60 >60 mL/min   GFR calc Af Amer >60 >60 mL/min   Anion gap 9 5 - 15     Studies/Results Radiology     MEDS, Scheduled . cefoTEtan in Dextrose 5%      . Chlorhexidine Gluconate Cloth  6 each Topical Once   And  . Chlorhexidine Gluconate Cloth  6 each Topical Once  . [MAR Hold] pantoprazole (PROTONIX) IV  40 mg Intravenous QHS     Assessment:  Large bowel obstruction  Plan: Discussed case with Dr  Penelope Coop.  It is possible the mass is in the left side.  I discussed this with the patient.  We will perform laparoscopic eval first.  If tumor is on L side, we will do a left colectomy and colostomy.  If on right side, I have recommended a total abdominal colectomy and IRA.  I believe he understands this and agrees to proceed. The surgery and anatomy were described to the patient as well as the risks of surgery and the possible complications.  These include: Bleeding, deep abdominal infections and possible wound complications such as hernia and infection, damage to adjacent structures, leak of surgical connections, which can lead to other surgeries and possibly an ostomy, possible need for other procedures, such as abscess drains in radiology, possible prolonged hospital stay, possible diarrhea from removal of part of the colon, possible constipation from narcotics, prolonged fatigue/weakness or appetite loss, possible early recurrence of of disease, possible complications of their medical problems such as heart disease or arrhythmias or lung problems, death (less than 1%). I believe the patient understands and wishes to proceed with the surgery.   LOS: 0 days    Rosario Adie, MD [facility], [place] [phone]   06/09/2017 10:00 AM

## 2017-06-09 NOTE — Anesthesia Procedure Notes (Signed)
Procedure Name: Intubation Date/Time: 06/09/2017 10:34 AM Performed by: Anne Fu Pre-anesthesia Checklist: Patient identified, Emergency Drugs available, Suction available, Patient being monitored and Timeout performed Patient Re-evaluated:Patient Re-evaluated prior to induction Oxygen Delivery Method: Circle system utilized Preoxygenation: Pre-oxygenation with 100% oxygen Induction Type: IV induction, Rapid sequence and Cricoid Pressure applied Laryngoscope Size: Mac and 4 Grade View: Grade I Tube type: Oral Tube size: 7.5 mm Number of attempts: 1 Airway Equipment and Method: Video-laryngoscopy Placement Confirmation: ETT inserted through vocal cords under direct vision,  positive ETCO2 and breath sounds checked- equal and bilateral Secured at: 22 cm Tube secured with: Tape Dental Injury: Teeth and Oropharynx as per pre-operative assessment  Comments: Pt with Hx of vocal cord paralyzation with surgical repair.  Glidescopy used to visualize opening and ETT placed without movement of support structure.  Clear bilat breath sounds post intubation.

## 2017-06-10 ENCOUNTER — Encounter (HOSPITAL_COMMUNITY): Payer: Self-pay | Admitting: General Surgery

## 2017-06-10 LAB — CBC
HCT: 28.5 % — ABNORMAL LOW (ref 39.0–52.0)
Hemoglobin: 9 g/dL — ABNORMAL LOW (ref 13.0–17.0)
MCH: 23.1 pg — AB (ref 26.0–34.0)
MCHC: 31.6 g/dL (ref 30.0–36.0)
MCV: 73.1 fL — AB (ref 78.0–100.0)
PLATELETS: 164 10*3/uL (ref 150–400)
RBC: 3.9 MIL/uL — AB (ref 4.22–5.81)
RDW: 15 % (ref 11.5–15.5)
WBC: 13.4 10*3/uL — ABNORMAL HIGH (ref 4.0–10.5)

## 2017-06-10 LAB — BASIC METABOLIC PANEL
Anion gap: 6 (ref 5–15)
BUN: 9 mg/dL (ref 6–20)
CO2: 29 mmol/L (ref 22–32)
Calcium: 8.3 mg/dL — ABNORMAL LOW (ref 8.9–10.3)
Chloride: 100 mmol/L — ABNORMAL LOW (ref 101–111)
Creatinine, Ser: 1.07 mg/dL (ref 0.61–1.24)
GFR calc Af Amer: >60 mL/min (ref >60)
GLUCOSE: 134 mg/dL — AB (ref 65–99)
POTASSIUM: 4.3 mmol/L (ref 3.5–5.1)
Sodium: 135 mmol/L (ref 135–145)

## 2017-06-10 LAB — CEA: CEA: 1.8 ng/mL (ref 0.0–4.7)

## 2017-06-10 MED ORDER — ENOXAPARIN SODIUM 40 MG/0.4ML ~~LOC~~ SOLN
40.0000 mg | SUBCUTANEOUS | Status: DC
  Administered 2017-06-10 – 2017-06-14 (×5): 40 mg via SUBCUTANEOUS
  Filled 2017-06-10 (×5): qty 0.4

## 2017-06-10 MED ORDER — PHENOL 1.4 % MT LIQD
1.0000 | OROMUCOSAL | Status: DC | PRN
  Filled 2017-06-10: qty 177

## 2017-06-10 MED ORDER — ACETAMINOPHEN 500 MG PO TABS
1000.0000 mg | ORAL_TABLET | Freq: Three times a day (TID) | ORAL | Status: DC
  Administered 2017-06-10 – 2017-06-15 (×15): 1000 mg via ORAL
  Filled 2017-06-10 (×14): qty 2

## 2017-06-10 MED ORDER — MENTHOL 3 MG MT LOZG
1.0000 | LOZENGE | OROMUCOSAL | Status: DC | PRN
  Administered 2017-06-10: 3 mg via ORAL
  Filled 2017-06-10: qty 9

## 2017-06-10 MED ORDER — OXYCODONE HCL 5 MG PO TABS
5.0000 mg | ORAL_TABLET | ORAL | Status: DC | PRN
  Administered 2017-06-12: 5 mg via ORAL
  Filled 2017-06-10: qty 1

## 2017-06-10 NOTE — Progress Notes (Signed)
Pt had an episode of confusion. He got up and went to the bathroom to urinate independently and pulled out his IV. Wife said she was sleeping and didn't realize he got up. Pt states he thought he was at home and felt tied up by the SCDs, sheets, and other devices. Patient reoriented by nurse and nurse tech and bed alarm placed along with increased lighting.

## 2017-06-10 NOTE — Consult Note (Addendum)
[facility] Nurse ostomy consult note Surgical team following for assessment and plan of care for abd wound.  Stoma type/location:  Pt had colostomy surgery performed yesterday to LLQ Stomal assessment/size: Stoma red and viable, edematous and moist, seeping clear mucous Peristomal assessment:  Intact skin surrounding Output: Small amt pink drainage, no stool or flatus  Ostomy pouching: 1pc..  Education provided: Demonstrated pouch change to patient, daughter, and wife at the bedside.  They asked appropriate questions and everyone was able to open and close velcro to empty. Reviewed pouching routines and ordering supplies.  [place] team will continue to follow for further teaching sessions. Enrolled patient in [place] program: No [name] MSN, [place], [place], [place], [place]

## 2017-06-10 NOTE — Progress Notes (Signed)
Discharge planning, spoke with patient and spouse at beside. No preference for [facility] agency, contacted [facility] for referral. Anticipating d/c over weekend. Patient and family with lots of questions, referred them to [facility] nurse. Awaiting final HH orders. [phone]

## 2017-06-10 NOTE — Progress Notes (Signed)
[facility] Progress Note  1 Day Post-Op  Subjective: CC:  Reports soreness in back and abdomen. Tolerating clears without worsening of pain or N/V. Denies gas or stool in colostomy pouch. Has not looking at stoma/incision. [facility] nurse, [place], at bedside.  Objective: Vital signs in last 24 hours: Temp:  [98.2 F (36.8 C)-99.8 F (37.7 C)] 98.8 F (37.1 C) (07/12 0440) Pulse Rate:  [61-98] 61 (07/12 0440) Resp:  [12-26] 18 (07/12 0440) BP: (109-172)/(67-103) 167/67 (07/12 0440) SpO2:  [93 %-100 %] 97 % (07/12 0440) Last BM Date: 06/03/17  Intake/Output from previous day: 07/11 0701 - 07/12 0700 In: 5821.7 [P.O.:480; I.V.:5291.7; IV Piggyback:50] Out: 2900 [Urine:2775; Stool:75; Blood:50] Intake/Output this shift: No intake/output data recorded.  PE: Gen:  Alert, NAD, pleasant Card:  Regular rate and rhythm, pedal pulses 2+ BL Pulm:  Normal effort, clear to auscultation bilaterally Abd: Soft, appropriately tender, mild distention, hypoactive bowel sounds, midline incision in tact with some dried sanguinous drainage on honeycomb; stoma viable and edematous, no colostomy output.  Skin: warm and dry, no rashes  Psych: A&Ox3   Lab Results:   Recent Labs  06/08/17 1158 06/09/17 0659  WBC 19.7* 9.9  HGB 10.8* 8.6*  HCT 34.5* 27.7*  PLT 221 170   BMET  Recent Labs  06/08/17 1158 06/09/17 0659  NA 136 139  K 3.5 4.1  CL 100* 106  CO2 25 24  GLUCOSE 135* 99  BUN 16 15  CREATININE 1.30* 1.14  CALCIUM 9.5 8.4*   PT/INR No results for input(s): LABPROT, INR in the last 72 hours. CMP     Component Value Date/Time   NA 139 06/09/2017 0659   K 4.1 06/09/2017 0659   CL 106 06/09/2017 0659   CO2 24 06/09/2017 0659   GLUCOSE 99 06/09/2017 0659   BUN 15 06/09/2017 0659   CREATININE 1.14 06/09/2017 0659   CALCIUM 8.4 (L) 06/09/2017 0659   PROT 8.3 (H) 06/08/2017 1158   ALBUMIN 4.4 06/08/2017 1158   AST 18 06/08/2017 1158   ALT 12 (L) 06/08/2017 1158    ALKPHOS 97 06/08/2017 1158   BILITOT 1.1 06/08/2017 1158   GFRNONAA >60 06/09/2017 0659   GFRAA >60 06/09/2017 0659   Lipase     Component Value Date/Time   LIPASE 19 06/08/2017 1158       Studies/Results: Ct Abdomen Pelvis W Contrast  Result Date: 06/08/2017 CLINICAL DATA:  71 y/o  M; obstructing colon mass. EXAM: CT ABDOMEN AND PELVIS WITH CONTRAST TECHNIQUE: Multidetector CT imaging of the abdomen and pelvis was performed using the standard protocol following bolus administration of intravenous contrast. CONTRAST:  181mL ISOVUE-300 IOPAMIDOL (ISOVUE-300) INJECTION 61% COMPARISON:  None. FINDINGS: Lower chest: 4 mm juxtapleural nodule of the right middle lobe (series 6, image 18). Hepatobiliary: No focal liver abnormality is seen. No gallstones, gallbladder wall thickening, or biliary dilatation. Pancreas: Unremarkable. No pancreatic ductal dilatation or surrounding inflammatory changes. Spleen: Normal in size without focal abnormality. Adrenals/Urinary Tract: Numerous renal cysts measuring up to 8.6 cm in the left kidney lower pole. Right kidney 2 mm interpolar nonobstructing stone. Normal adrenal glands. No hydronephrosis. Normal bladder. Stomach/Bowel: Mass of the transverse colon near the splenic flexure measuring 4 cm in length (Series 3, image 66, series 4, image 165, series 2, image 24). Proximal dilatation of the ascending and transverse colon due to obstruction. Mild inflammatory changes of the cecum. Normal small bowel. Vascular/Lymphatic: No lymphadenopathy by imaging criteria. Reproductive: Prostate enlargement, 6.6 x 7.2  x 6.1 cm (volume = 150 cm^3). Other: Small paraumbilical hernia containing fat. Bilateral fat containing inguinal hernias. Musculoskeletal: No acute fracture or suspicious osseous lesion. Lumbar degenerative changes post L5-S1 interbody fusion. IMPRESSION: 1. Mass of the transverse colon near splenic flexure measuring up to 4 cm compatible with colonic adenocarcinoma.  No lymphadenopathy by imaging criteria. 2. Proximal dilatation of ascending and transverse colon with mild inflammatory changes likely due to obstruction. 3. 4 mm right middle lobe pulmonary nodule, attention on follow-up recommended. 4. 2 mm right kidney nonobstructing nephrolithiasis. Electronically Signed   By: Kristine Garbe M.D.   On: 06/08/2017 14:25    Anti-infectives: Anti-infectives    Start     Dose/Rate Route Frequency Ordered Stop   06/09/17 1001  ciprofloxacin (CIPRO) 400 MG/200ML IVPB  Status:  Discontinued    Comments:  Waldron Session   : cabinet override      06/09/17 1001 06/09/17 1411   06/09/17 0932  cefoTEtan in Dextrose 5% (CEFOTAN) 2-2.08 GM-% IVPB    Comments:  Delena Bali   : cabinet override      06/09/17 0932 06/09/17 2144   06/09/17 0745  cefoTEtan (CEFOTAN) 2 g in dextrose 5 % 50 mL IVPB     2 g 100 mL/hr over 30 Minutes Intravenous On call to O.R. 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 Penelope Coop - continue clear liquid diet and await further bowel function - pain control: start PO pain control with scheduled tylenol and PRN oxycodone, Morphine PRN breakthrough - OOB, IS!! - D/C Foley    AKI - resolved Microcytic anemia - hgb 9.0 from 8.6  ID - perioperative cefotetan 7/11 VTE - SCDs, start Lovenox tonight CBC in AM FEN - clears   LOS: 1 day    Jill Alexanders , [facility]  Surgery 06/10/2017, 7:48 AM Pager: [phone] Consults: [phone] Mon-Fri 7:00 am-4:30 pm Sat-Sun 7:00 am-11:30 am

## 2017-06-11 LAB — CBC
HEMATOCRIT: 32.5 % — AB (ref 39.0–52.0)
Hemoglobin: 10.3 g/dL — ABNORMAL LOW (ref 13.0–17.0)
MCH: 22.7 pg — ABNORMAL LOW (ref 26.0–34.0)
MCHC: 31.7 g/dL (ref 30.0–36.0)
MCV: 71.6 fL — AB (ref 78.0–100.0)
PLATELETS: 255 10*3/uL (ref 150–400)
RBC: 4.54 MIL/uL (ref 4.22–5.81)
RDW: 15 % (ref 11.5–15.5)
WBC: 17.2 10*3/uL — ABNORMAL HIGH (ref 4.0–10.5)

## 2017-06-11 NOTE — Consult Note (Signed)
WOC Nurse ostomy follow up Stoma type/location:upper middle abdomen, end colostomy Stomal assessment/size:  2" budded, pink, moist, edematous Peristomal assessment: pouch intact from change yesterday Treatment options for stomal/peristomal skin: none needed Output none, just some scant bleeding Ostomy pouching: 1pc in place   Education provided:  Met with granddaughter and patient's wife, he is quite sleepy and snoring while WOC providing education. Demonstrated measuring stoma with measuring guide and educated on how stoma will change/shrink, importance of measuring weekly.  Demonstrated lock and roll closure to family in the room.  Patient is a little disoriented at times, most likely to receiving IV antinausea meds this am. Demonstrated using wick to clean out the bottom of the spigot with toilet paper wick and how he may empty either standing or sitting.  Enrolled patient in [place] [employer] Discharge program: Yes  WOC Nurse will follow along with you for continued support with ostomy teaching and care   MSN, RN, CWOCN, CNS, CWON-AP [phone] 

## 2017-06-11 NOTE — Progress Notes (Signed)
[facility] Progress Note  2 Days Post-Op  Subjective: CC:  Reports some nausea today, pain better, having trouble sleeping  Objective: Vital signs in last 24 hours: Temp:  [98.8 F (37.1 C)-99.5 F (37.5 C)] 99.3 F (37.4 C) (07/13 0818) Pulse Rate:  [66-89] 88 (07/13 0818) Resp:  [18-21] 21 (07/13 0818) BP: (140-160)/(66-90) 148/78 (07/13 0818) SpO2:  [94 %-99 %] 94 % (07/13 0818) Last BM Date: 06/04/17  Intake/Output from previous day: 07/12 0701 - 07/13 0700 In: 92 [P.O.:480; I.V.:110] Out: 3670 [Urine:3650; Stool:20] Intake/Output this shift: Total I/O In: -  Out: 500 [Urine:500]  PE: Gen:  Alert, NAD, pleasant Card:  Regular rate and rhythm, pedal pulses 2+ BL Pulm:  Normal effort, clear to auscultation bilaterally Abd: Soft, appropriately tender, moderate distention, midline incision in tact with some dried sanguinous drainage on honeycomb; stoma viable and edematous, no colostomy output.  Skin: warm and dry, no rashes  Psych: A&Ox3   Lab Results:   Recent Labs  06/10/17 0726 06/11/17 0755  WBC 13.4* 17.2*  HGB 9.0* 10.3*  HCT 28.5* 32.5*  PLT 164 255   BMET  Recent Labs  06/09/17 0659 06/10/17 0726  NA 139 135  K 4.1 4.3  CL 106 100*  CO2 24 29  GLUCOSE 99 134*  BUN 15 9  CREATININE 1.14 1.07  CALCIUM 8.4* 8.3*   PT/INR No results for input(s): LABPROT, INR in the last 72 hours. CMP     Component Value Date/Time   NA 135 06/10/2017 0726   K 4.3 06/10/2017 0726   CL 100 (L) 06/10/2017 0726   CO2 29 06/10/2017 0726   GLUCOSE 134 (H) 06/10/2017 0726   BUN 9 06/10/2017 0726   CREATININE 1.07 06/10/2017 0726   CALCIUM 8.3 (L) 06/10/2017 0726   PROT 8.3 (H) 06/08/2017 1158   ALBUMIN 4.4 06/08/2017 1158   AST 18 06/08/2017 1158   ALT 12 (L) 06/08/2017 1158   ALKPHOS 97 06/08/2017 1158   BILITOT 1.1 06/08/2017 1158   GFRNONAA >60 06/10/2017 0726   GFRAA >60 06/10/2017 0726   Lipase     Component Value Date/Time   LIPASE 19 06/08/2017 1158       Studies/Results: No results found.  Anti-infectives: Anti-infectives    Start     Dose/Rate Route Frequency Ordered Stop   06/09/17 1001  ciprofloxacin (CIPRO) 400 MG/200ML IVPB  Status:  Discontinued    Comments:  Whitlow, Cheryl   : cabinet override      06/09/17 1001 06/09/17 1411   06/09/17 0932  cefoTEtan in Dextrose 5% (CEFOTAN) 2-2.08 GM-% IVPB    Comments:  Delena Bali   : cabinet override      06/09/17 0932 06/09/17 2144   06/09/17 0745  cefoTEtan (CEFOTAN) 2 g in dextrose 5 % 50 mL IVPB     2 g 100 mL/hr over 30 Minutes Intravenous On call to O.R. 06/09/17 0735 06/09/17 1045   06/08/17 1800  ciprofloxacin (CIPRO) IVPB 400 mg  Status:  Discontinued     400 mg 200 mL/hr over 60 Minutes Intravenous Every 12 hours 06/08/17 1658 06/09/17 1635   06/08/17 1800  metroNIDAZOLE (FLAGYL) IVPB 500 mg  Status:  Discontinued     500 mg 100 mL/hr over 60 Minutes Intravenous Every 6 hours 06/08/17 1658 06/09/17 1635       Assessment/Plan Adenocarcinoma of the left transverse colon POD#2 S/P LAPAROSCOPIC ASSISTED LEFT COLECTOMY - follow surgical pathology - appreciate WOC assistance with  colostomy education - GI physician: Dr. Penelope Coop - continue clear liquid diet as tolerated and await further bowel function - pain control: Cont PO pain control with scheduled tylenol and PRN oxycodone, Morphine PRN breakthrough - OOB, IS!! - D/C Foley    AKI - resolved Microcytic anemia - hgb 9.0 from 8.6  ID - perioperative cefotetan 7/11 VTE - SCDs, start Lovenox tonight CBC in AM FEN - clears   LOS: 2 days    Rosario Adie, MD  [facility]

## 2017-06-11 NOTE — Discharge Instructions (Signed)
[facility]      [facility] [phone]  OPEN ABDOMINAL SURGERY: POST OP INSTRUCTIONS  Always review your discharge instruction sheet given to you by the facility where your surgery was performed.  IF YOU HAVE DISABILITY OR FAMILY LEAVE FORMS, YOU MUST BRING THEM TO THE OFFICE FOR PROCESSING.  PLEASE DO NOT GIVE THEM TO YOUR DOCTOR.  1. A prescription for pain medication may be given to you upon discharge.  Take your pain medication as prescribed, if needed.  If narcotic pain medicine is not needed, then you may take acetaminophen (Tylenol) or ibuprofen (Advil) as needed. 2. Take your usually prescribed medications unless otherwise directed. 3. If you need a refill on your pain medication, please contact your pharmacy. They will contact our office to request authorization.  Prescriptions will not be filled after 5pm or on week-ends. 4. You should follow a light diet the first few days after arrival home, such as soup and crackers, pudding, etc.unless your doctor has advised otherwise. A high-fiber, low fat diet can be resumed as tolerated.   Be sure to include lots of fluids daily. Most patients will experience some swelling and bruising on the chest and neck area.  Ice packs will help.  Swelling and bruising can take several days to resolve 5. Most patients will experience some swelling and bruising in the area of the incision. Ice pack will help. Swelling and bruising can take several days to resolve..  6. It is common to experience some constipation if taking pain medication after surgery.  Increasing fluid intake and taking a stool softener will usually help or prevent this problem from occurring.  A mild laxative (Milk of Magnesia or Miralax) should be taken according to package directions if there are no bowel movements after 48 hours. 7.  You may have steri-strips (small skin tapes) in place directly over the incision.  These strips should be left on the skin for 7-10 days.  If your  surgeon used skin glue on the incision, you may shower in 24 hours.  The glue will flake off over the next 2-3 weeks.  Any sutures or staples will be removed at the office during your follow-up visit. You may find that a light gauze bandage over your incision may keep your staples from being rubbed or pulled. You may shower and replace the bandage daily. 8. ACTIVITIES:  You may resume regular (light) daily activities beginning the next day--such as daily self-care, walking, climbing stairs--gradually increasing activities as tolerated.  You may have sexual intercourse when it is comfortable.  Refrain from any heavy lifting or straining until approved by your doctor. a. You may drive when you no longer are taking prescription pain medication, you can comfortably wear a seatbelt, and you can safely maneuver your car and apply brakes 9. You should see your doctor in the office for a follow-up appointment approximately two weeks after your surgery.  Make sure that you call for this appointment within a day or two after you arrive home to insure a convenient appointment time. OTHER INSTRUCTIONS:  _____________________________________________________________ _____________________________________________________________  WHEN TO CALL YOUR DOCTOR: 1. Fever over 101.0 2. Inability to urinate 3. Nausea and/or vomiting 4. Extreme swelling or bruising 5. Continued bleeding from incision. 6. Increased pain, redness, or drainage from the incision. 7. Difficulty swallowing or breathing 8. Muscle cramping or spasms. 9. Numbness or tingling in hands or feet or around lips.  The clinic staff is available to answer your questions during regular   business hours.  Please don't hesitate to call and ask to speak to one of the nurses if you have concerns.  For further questions, please visit [email or web address]    

## 2017-06-12 DIAGNOSIS — Z933 Colostomy status: Secondary | ICD-10-CM

## 2017-06-12 DIAGNOSIS — K56609 Unspecified intestinal obstruction, unspecified as to partial versus complete obstruction: Secondary | ICD-10-CM

## 2017-06-12 HISTORY — DX: Unspecified intestinal obstruction, unspecified as to partial versus complete obstruction: K56.609

## 2017-06-12 LAB — CBC
HCT: 29.4 % — ABNORMAL LOW (ref 39.0–52.0)
Hemoglobin: 9.1 g/dL — ABNORMAL LOW (ref 13.0–17.0)
MCH: 22.8 pg — ABNORMAL LOW (ref 26.0–34.0)
MCHC: 31 g/dL (ref 30.0–36.0)
MCV: 73.7 fL — ABNORMAL LOW (ref 78.0–100.0)
PLATELETS: 229 10*3/uL (ref 150–400)
RBC: 3.99 MIL/uL — ABNORMAL LOW (ref 4.22–5.81)
RDW: 15.3 % (ref 11.5–15.5)
WBC: 14.3 10*3/uL — AB (ref 4.0–10.5)

## 2017-06-12 LAB — BASIC METABOLIC PANEL
ANION GAP: 6 (ref 5–15)
BUN: 10 mg/dL (ref 6–20)
CALCIUM: 8.4 mg/dL — AB (ref 8.9–10.3)
CO2: 29 mmol/L (ref 22–32)
Chloride: 101 mmol/L (ref 101–111)
Creatinine, Ser: 0.98 mg/dL (ref 0.61–1.24)
Glucose, Bld: 122 mg/dL — ABNORMAL HIGH (ref 65–99)
Potassium: 4.2 mmol/L (ref 3.5–5.1)
SODIUM: 136 mmol/L (ref 135–145)

## 2017-06-12 NOTE — Psychosocial Assessment (Signed)
Pts ostomy is beginning to put out stool. Family was interested in learning how to empty ostomy. I demonstrated/explained how to empty and clean pouch to pts wife and daughter.

## 2017-06-12 NOTE — Progress Notes (Signed)
[facility] Office:  [phone] General Surgery Progress Note   LOS: 3 days  POD -  3 Days Post-Op  Chief Complaint: Obstructing colon cancer  Assessment and Plan: 1.  LAPAROSCOPY DIAGNOSTIC, OPEN  LEFT COLECTOMY, Colostomy - 06/09/2017 - Dr. Marcello Moores  Path - 5.2 cm adenoca, 2/27 nodes (T3, N1)  Still slow to open up  On Clear liquids  2.  Anemia  Hgb - 9.1 - 06/12/2017 3.  DVT prophylaxis - Lovenox   Active Problems:   Colonic mass  Subjective:  Talkative.  Likes to tell a story.  Has had very little out of ostomy so far.  Wife in room  Objective:   Vitals:   06/11/17 2145 06/12/17 0500  BP: 124/62 131/74  Pulse: 79 77  Resp: 18 18  Temp: 98.4 F (36.9 C) 98.2 F (36.8 C)     Intake/Output from previous day:  07/13 0701 - 07/14 0700 In: 1840 [P.O.:820; I.V.:1020] Out: 1735 [Urine:1725; Stool:10]  Intake/Output this shift:  No intake/output data recorded.   Physical Exam:   General: Heavy WN WM who is alert and oriented.    HEENT: Normal. Pupils equal. .   Lungs: Clear.  IS=2,000c    Abdomen: Rare BS.   Wound: Lower midline wound okay, Ostomy abdomen - just to left of midline.   Lab Results:    Recent Labs  06/11/17 0755 06/12/17 0520  WBC 17.2* 14.3*  HGB 10.3* 9.1*  HCT 32.5* 29.4*  PLT 255 229    BMET   Recent Labs  06/10/17 0726 06/12/17 0520  NA 135 136  K 4.3 4.2  CL 100* 101  CO2 29 29  GLUCOSE 134* 122*  BUN 9 10  CREATININE 1.07 0.98  CALCIUM 8.3* 8.4*    PT/INR  No results for input(s): LABPROT, INR in the last 72 hours.  ABG  No results for input(s): PHART, HCO3 in the last 72 hours.  Invalid input(s): PCO2, PO2   Studies/Results:  No results found.   Anti-infectives:   Anti-infectives    Start     Dose/Rate Route Frequency Ordered Stop   06/09/17 1001  ciprofloxacin (CIPRO) 400 MG/200ML IVPB  Status:  Discontinued    Comments:  Whitlow, Cheryl   : cabinet override      06/09/17 1001 06/09/17 1411   06/09/17 0932  cefoTEtan in Dextrose 5% (CEFOTAN) 2-2.08 GM-% IVPB    Comments:  Delena Bali   : cabinet override      06/09/17 0932 06/09/17 2144   06/09/17 0745  cefoTEtan (CEFOTAN) 2 g in dextrose 5 % 50 mL IVPB     2 g 100 mL/hr over 30 Minutes Intravenous On call to O.R. 06/09/17 0735 06/09/17 1045   06/08/17 1800  ciprofloxacin (CIPRO) IVPB 400 mg  Status:  Discontinued     400 mg 200 mL/hr over 60 Minutes Intravenous Every 12 hours 06/08/17 1658 06/09/17 1635   06/08/17 1800  metroNIDAZOLE (FLAGYL) IVPB 500 mg  Status:  Discontinued     500 mg 100 mL/hr over 60 Minutes Intravenous Every 6 hours 06/08/17 1658 06/09/17 1635      Alphonsa Overall, MD, FACS Pager: [facility] Office: [phone] 06/12/2017

## 2017-06-13 MED ORDER — SODIUM CHLORIDE 0.9% FLUSH: 3.0000 mL | INTRAVENOUS | Status: DC | PRN

## 2017-06-13 NOTE — Progress Notes (Signed)
[facility] Office:  [phone] General Surgery Progress Note   LOS: 4 days  POD -  4 Days Post-Op  Chief Complaint: Obstructing colon cancer  Assessment and Plan: 1.  LAPAROSCOPY DIAGNOSTIC, OPEN  LEFT COLECTOMY, Colostomy - 06/09/2017 - Dr. Marcello Moores  Path - 5.2 cm adenoca, 2/27 nodes (T3, N1)  On Clear liquids - ostomy functioning  Plan:  Advance to full liquids, looks good  2.  Anemia  Hgb - 9.1 - 06/12/2017 3.  DVT prophylaxis - Lovenox   Principal Problem:   Cancer of transverse colon pT3, pN1b s/p colectomy 06/09/2017 Active Problems:   Colon obstruction s/p colectomy/colostomy 06/09/2017   Colostomy in place [facility])  Subjective:  Talkative.  Doing well.  Happy with progress.  Wife in room  Objective:   Vitals:   06/12/17 2253 06/13/17 0536  BP: (!) 149/76 (!) 150/81  Pulse: 72 65  Resp: 20 18  Temp: 98.4 F (36.9 C) 98.5 F (36.9 C)     Intake/Output from previous day:  07/14 0701 - 07/15 0700 In: 1800 [I.V.:1800] Out: 3850 [Urine:3600; Stool:250]  Intake/Output this shift:  Total I/O In: -  Out: 250 [Stool:250]   Physical Exam:   General: Heavy WN WM who is alert and oriented.    HEENT: Normal. Pupils equal. .   Lungs: Clear.     Abdomen: Rare BS.   Wound: Lower midline wound okay, Ostomy upper abdomen - just to left of midline.   Lab Results:     Recent Labs  06/11/17 0755 06/12/17 0520  WBC 17.2* 14.3*  HGB 10.3* 9.1*  HCT 32.5* 29.4*  PLT 255 229    BMET    Recent Labs  06/12/17 0520  NA 136  K 4.2  CL 101  CO2 29  GLUCOSE 122*  BUN 10  CREATININE 0.98  CALCIUM 8.4*    PT/INR  No results for input(s): LABPROT, INR in the last 72 hours.  ABG  No results for input(s): PHART, HCO3 in the last 72 hours.  Invalid input(s): PCO2, PO2   Studies/Results:  No results found.   Anti-infectives:   Anti-infectives    Start     Dose/Rate Route Frequency Ordered Stop   06/09/17 1001  ciprofloxacin (CIPRO) 400  MG/200ML IVPB  Status:  Discontinued    Comments:  Whitlow, Cheryl   : cabinet override      06/09/17 1001 06/09/17 1411   06/09/17 0932  cefoTEtan in Dextrose 5% (CEFOTAN) 2-2.08 GM-% IVPB    Comments:  Delena Bali   : cabinet override      06/09/17 0932 06/09/17 2144   06/09/17 0745  cefoTEtan (CEFOTAN) 2 g in dextrose 5 % 50 mL IVPB     2 g 100 mL/hr over 30 Minutes Intravenous On call to O.R. 06/09/17 0735 06/09/17 1045   06/08/17 1800  ciprofloxacin (CIPRO) IVPB 400 mg  Status:  Discontinued     400 mg 200 mL/hr over 60 Minutes Intravenous Every 12 hours 06/08/17 1658 06/09/17 1635   06/08/17 1800  metroNIDAZOLE (FLAGYL) IVPB 500 mg  Status:  Discontinued     500 mg 100 mL/hr over 60 Minutes Intravenous Every 6 hours 06/08/17 1658 06/09/17 1635      Alphonsa Overall, MD, FACS Pager: [facility] Office: [phone] 06/13/2017

## 2017-06-14 NOTE — Consult Note (Signed)
[place] Nurse ostomy follow up Stoma type/location: middle upper abdomen, end colostomy Stomal assessment/size: 2" round, budded, pink Peristomal assessment: intact  Treatment options for stomal/peristomal skin: NA Output liquid brown Ostomy pouching: 1pc.transparent flat pouch.  Education provided:  Extended educational session over 1 hour. Went through every step of pouch change. Measuring stoma. Cleaning spout with toilet paper wick. Daughter and wife at bedside then videoed [place] nurse with my permission. Discussed diet and pouch change frequency. Wife seems interested in beige pouch with filter, however it appears patient will rely on wife and daughter for all care. Using beige pouch may me challenging to see for placement. I will send sample of both to home.  Enrolled patient in [place] Start Discharge program: Yes  [facility] nurse team will plan next assessment and educational session on Wednesday of this week. Would suggest [facility] for continued support with ostomy care.  [place], [place], [place]

## 2017-06-14 NOTE — Progress Notes (Signed)
We got referral about this pt from Dr. Johney Maine today.   I have reviewed his chart. I stopped by to speak with patient and his wife. We briefly reviewed the adjuvant chemo and future surveillance plan.   I'll schedule his formal consultation appointment with Dr. Benay Spice or me in 3 weeks, patient knows that our scheduler will call him.  I encouraged him to focus on postoperative recovery.  Truitt Merle MD, medical oncology 06/14/2017

## 2017-06-14 NOTE — Progress Notes (Signed)
[facility] Progress Note  5 Days Post-Op  Subjective: CC:  Abdominal pain controlled with tylenol. Reports some upper respiratory congestion, present before admission, that he feels is improving. Tolerating full liquids. Having colostomy output. Mobilizing regularly in the hallway. Daughter to visit today around noon and wants to learn how to assist with colostomy care.  Afebrile, VSS Objective: Vital signs in last 24 hours: Temp:  [98.5 F (36.9 C)-98.9 F (37.2 C)] 98.5 F (36.9 C) (07/16 0641) Pulse Rate:  [62-72] 62 (07/16 0641) Resp:  [18-20] 18 (07/16 0641) BP: (132-160)/(71-80) 160/80 (07/16 0641) SpO2:  [99 %] 99 % (07/16 0641) Last BM Date: 06/12/17  Intake/Output from previous day: 07/15 0701 - 07/16 0700 In: 240 [P.O.:240] Out: 3800 [Urine:2200; Stool:1600] Intake/Output this shift: No intake/output data recorded.  PE: Gen:  Alert, NAD, pleasant Pulm:  Normal effort Abd: Soft, non-tender, non-distended, bowel sounds present in all 4 quadrants, midline incision c/d/i, staples in place, colostomy with good seal - stool in pouch. Stoma pink and viable. Skin: warm and dry, no rashes  Psych: A&Ox3   Lab Results:   Recent Labs  06/11/17 0755 06/12/17 0520  WBC 17.2* 14.3*  HGB 10.3* 9.1*  HCT 32.5* 29.4*  PLT 255 229   BMET  Recent Labs  06/12/17 0520  NA 136  K 4.2  CL 101  CO2 29  GLUCOSE 122*  BUN 10  CREATININE 0.98  CALCIUM 8.4*   PT/INR No results for input(s): LABPROT, INR in the last 72 hours. CMP     Component Value Date/Time   NA 136 06/12/2017 0520   K 4.2 06/12/2017 0520   CL 101 06/12/2017 0520   CO2 29 06/12/2017 0520   GLUCOSE 122 (H) 06/12/2017 0520   BUN 10 06/12/2017 0520   CREATININE 0.98 06/12/2017 0520   CALCIUM 8.4 (L) 06/12/2017 0520   PROT 8.3 (H) 06/08/2017 1158   ALBUMIN 4.4 06/08/2017 1158   AST 18 06/08/2017 1158   ALT 12 (L) 06/08/2017 1158   ALKPHOS 97 06/08/2017 1158   BILITOT 1.1  06/08/2017 1158   GFRNONAA >60 06/12/2017 0520   GFRAA >60 06/12/2017 0520   Lipase     Component Value Date/Time   LIPASE 19 06/08/2017 1158       Studies/Results: No results found.  Anti-infectives: Anti-infectives    Start     Dose/Rate Route Frequency Ordered Stop   06/09/17 1001  ciprofloxacin (CIPRO) 400 MG/200ML IVPB  Status:  Discontinued    Comments:  Whitlow, Cheryl   : cabinet override      06/09/17 1001 06/09/17 1411   06/09/17 0932  cefoTEtan in Dextrose 5% (CEFOTAN) 2-2.08 GM-% IVPB    Comments:  Delena Bali   : cabinet override      06/09/17 0932 06/09/17 2144   06/09/17 0745  cefoTEtan (CEFOTAN) 2 g in dextrose 5 % 50 mL IVPB     2 g 100 mL/hr over 30 Minutes Intravenous On call to O.R. 06/09/17 0735 06/09/17 1045   06/08/17 1800  ciprofloxacin (CIPRO) IVPB 400 mg  Status:  Discontinued     400 mg 200 mL/hr over 60 Minutes Intravenous Every 12 hours 06/08/17 1658 06/09/17 1635   06/08/17 1800  metroNIDAZOLE (FLAGYL) IVPB 500 mg  Status:  Discontinued     500 mg 100 mL/hr over 60 Minutes Intravenous Every 6 hours 06/08/17 1658 06/09/17 1635       Assessment/Plan Transverse colon mass POD#5 s/p LAPAROSCOPY DIAGNOSTIC, OPEN  LEFT COLECTOMY, Colostomy - 06/09/2017 - Dr. Marcello Moores             Path - 5.2 cm adenoca, 2/27 nodes (T3, N1)             Ostomy functioning             Advance to SOFT diet   Mobilize/IS  WOC RN, Melody, to meet with family today after 12:30 PM to provide ostomy education.  Anticipate hospital discharge tomorrow AM. Home with [facility] for ostomy care.  Microcytic anemia - Hgb 9.1 on 06/12/2017 DVT prophylaxis - Lovenox, SCDs FEN - SOFT diet  ID - cipro x 1 dose 7/10, flagyl 7/10-7/11, perioperative cefotetan 7/11 Pain - scheduled tylenol, PRN oxycodone (has not taken in 48 h)   LOS: 5 days    Jill Alexanders , [facility] 06/14/2017, 7:38 AM Pager: [phone] Consults: [phone] Mon-Fri 7:00 am-4:30  pm Sat-Sun 7:00 am-11:30 am

## 2017-06-15 ENCOUNTER — Encounter (HOSPITAL_COMMUNITY): Payer: Self-pay | Admitting: General Surgery

## 2017-06-15 MED ORDER — ACETAMINOPHEN 500 MG PO TABS: 1000.0000 mg | ORAL_TABLET | Freq: Three times a day (TID) | ORAL | 0 refills | Status: DC | PRN

## 2017-06-15 NOTE — Progress Notes (Signed)
Pt was discharged home today. Instructions were reviewed with patient, and questions were answered. Pt was taken to main entrance via wheelchair by NT.  

## 2017-06-15 NOTE — Care Management Important Message (Signed)
Important Message  Patient Details  Name: James Doyle MRN: [id number] Date of Birth: Aug 16, 1946   Medicare Important Message Given:  Yes    [name] 06/15/2017, 10:38 AMImportant Message  Patient Details  Name: James Doyle MRN: [id number] Date of Birth: 10/03/1946   Medicare Important Message Given:  Yes    [name] 06/15/2017, 10:38 AM

## 2017-06-15 NOTE — Discharge Summary (Signed)
[facility] Discharge Summary   Patient ID: James Doyle MRN: [id number] DOB/AGE: [birth year] 71 y.o.  Admit date: 06/08/2017 Discharge date: 06/15/2017  Discharge Diagnosis Patient Active Problem List   Diagnosis Date Noted  . 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 Colostomy in place [facility]) 06/12/2017  . Cancer of transverse colon pT3, pN1b s/p colectomy 06/09/2017 06/08/2017  . OA (osteoarthritis) of knee 07/27/2016    Consultants [place], [place], CNS, CWON-AP; Gwyndolyn Saxon, MSN, RN, [place], CWCN-AP, CNS Oncology - James Doyle   Procedures James Doyle ([birth year]) - [place] Hospital Course:  James Doyle presented to [facility] ED on 06/08/17 from the [place] surgery office as recommended by James Doyle due to concerns for obstructing sigmoid colon mass. Patient previously underwent colonoscopy by James Doyle where biopsy was taken and significant for adenocarcinoma. CT scan performed in the emergency department consistent with descending colon tumor. The patient was admitted, started on IV antibiotics, and on 06/09/17 underwent the procedure above. The patient tolerated the procedure well and was transferred to the floor. Diet was advanced as tolerated as patient's bowel function returned. He was seen by the wound, ostomy, continence nurses for colostomy education. On 06/15/17 the patient's vitals were stable, pain controlled, tolerating by mouth, having bowel function, and medically stable for discharge home with home health care to assist with colostomy care. He should return to our office in 1 week for staple removal. The patient will call to schedule an appointment for postoperative follow up with James Doyle in 3-4 weeks. He will also require a formal oncology consultation, appreciate James Doyle scheduling that appointment. He knows to call with questions or concerns.  Physical  Exam: Gen:  Alert, NAD, pleasant Pulm:  Normal effort Abd: Soft, non-tender, non-distended, bowel sounds present in all 4 quadrants, midline incision c/d/i, staples in place, colostomy with good seal - stool in pouch. Stoma pink and viable. Skin: warm and dry, no rashes  Psych: A&Ox3   Allergies as of 06/15/2017   No Known Allergies     Medication List    STOP taking these medications   rivaroxaban 10 MG Tabs tablet Commonly known as:  XARELTO     TAKE these medications   acetaminophen 500 MG tablet Commonly known as:  TYLENOL Take 2 tablets (1,000 mg total) by mouth every 8 (eight) hours as needed.   docusate sodium 100 MG capsule Commonly known as:  COLACE Take 200 mg by mouth 2 (two) times daily as needed for mild constipation.   polyethylene glycol packet Commonly known as:  MIRALAX / GLYCOLAX Take 17 g by mouth daily as needed for mild constipation.        Follow-up Information    [place], [place] Follow up.   Specialty:  [place] Why:  nurse to assist with ostomy care Contact information: [place] [address] [phone]        Leighton Ruff, MD. Schedule an appointment as soon as possible for a visit in 3 week(s).   Specialty:  General Surgery Why:  for post-operative follow up. please arrive 30 minutes prior to scheduled appointment. Contact information: [address] [place] [address] [phone]        [facility] [place] [facility], [place]. Go on 06/23/2017.   Specialty:  General Surgery Why:  at 10 AM for staple removal. please arrive 30 minutes early.  Contact  information: [address] [place] [phone]          Signed: Obie Doyle, [facility] 06/15/2017, 9:18 AM Pager: [phone] Consults: [phone] Mon-Fri 7:00 am-4:30 pm Sat-Sun 7:00 am-11:30 am

## 2017-06-20 NOTE — Anesthesia Postprocedure Evaluation (Signed)
Anesthesia Post Note  Patient: James Doyle  Procedure(s) Performed: Procedure(s) (LRB): LAPAROSCOPY DIAGNOSTIC, OPEN  LEFT COLECTOMY (N/A)     Patient location during evaluation: PACU Anesthesia Type: General Level of consciousness: awake and sedated Pain management: pain level controlled Vital Signs Assessment: post-procedure vital signs reviewed and stable Respiratory status: spontaneous breathing, nonlabored ventilation, respiratory function stable and patient connected to nasal cannula oxygen Cardiovascular status: blood pressure returned to baseline and stable Postop Assessment: no signs of nausea or vomiting Anesthetic complications: no    Last Vitals:  Vitals:   06/14/17 2219 06/15/17 0517  BP: 139/84 (!) 145/86  Pulse: 62 66  Resp: 16 18  Temp: 37.2 C 37 C    Last Pain:  Vitals:   06/15/17 0900  TempSrc:   PainSc: 2                  Marshawn Ninneman,JAMES TERRILL

## 2017-06-22 ENCOUNTER — Telehealth: Payer: Self-pay | Admitting: Hematology

## 2017-06-22 ENCOUNTER — Encounter: Payer: Self-pay | Admitting: Hematology

## 2017-06-22 NOTE — Telephone Encounter (Signed)
Left a vm to schedule an appt w/Dr. Burr Medico.

## 2017-06-22 NOTE — Telephone Encounter (Signed)
Appt scheduled w/the pt's wife on 8/8 at 230pm to see Dr. Burr Medico. Aware to arrive 30 minutes early. Letter mailed.

## 2017-07-07 ENCOUNTER — Encounter: Payer: Self-pay | Admitting: Hematology

## 2017-07-07 ENCOUNTER — Ambulatory Visit (HOSPITAL_BASED_OUTPATIENT_CLINIC_OR_DEPARTMENT_OTHER): Payer: Medicare Other | Admitting: Hematology

## 2017-07-07 DIAGNOSIS — M1990 Unspecified osteoarthritis, unspecified site: Secondary | ICD-10-CM | POA: Diagnosis not present

## 2017-07-07 DIAGNOSIS — Z800 Family history of malignant neoplasm of digestive organs: Secondary | ICD-10-CM | POA: Diagnosis not present

## 2017-07-07 DIAGNOSIS — C186 Malignant neoplasm of descending colon: Secondary | ICD-10-CM

## 2017-07-07 NOTE — Progress Notes (Signed)
[name]  [phone] [phone] [place] Note   Patient Care Team: Alroy Dust, L.Marlou Sa, MD as PCP - General (Family Medicine) 07/07/2017  REFERRAL PHYSICIAN: Dr. Marcello Moores   CHIEF COMPLAINTS/PURPOSE OF CONSULTATION:  Consultation for Colon Cancer  Oncology History   Cancer Staging Cancer of left colon [facility]) Staging form: Colon and Rectum, AJCC 8th Edition - Pathologic stage from 06/09/2017: Stage IIIB (pT3, pN1b, cM0) - Signed by Truitt Merle, MD on 07/06/2017       Cancer of left colon ([place])   06/08/2017 Initial Diagnosis    Cancer of left colon ([place])     06/08/2017 Imaging    CT AP W Contrast 06/08/17 IMPRESSION: 1. Mass of the transverse colon near splenic flexure measuring up to 4 cm compatible with colonic adenocarcinoma. No lymphadenopathy by imaging criteria. 2. Proximal dilatation of ascending and transverse colon with mild inflammatory changes likely due to obstruction. 3. 4 mm right middle lobe pulmonary nodule, attention on follow-up recommended. 4. 2 mm right kidney nonobstructing nephrolithiasis.      06/09/2017 Surgery    LAPAROSCOPY DIAGNOSTIC, OPEN LEFT COLECTOMY by Dr. Marcello Moores      06/09/2017 Pathology Results    Diagnosis 06/09/17 Colon, segmental resection for tumor, left - INVASIVE ADENOCARCINOMA, WELL DIFFERENTIATED, SPANNING 5 CM. - TUMOR INVADES THROUGH MUSCULARIS PROPRIA INTO SUBSEROSAL TISSUE. - RESECTION MARGINS ARE NEGATIVE. - METASTATIC CARCINOMA IN TWO OF TWENTY-SEVEN LYMPH NODES (2/27). - SEE ONCOLOGY TABLE.         HISTORY OF PRESENTING ILLNESS: 07/07/17 James Doyle 71 y.o. male is here because of newly diagnosed cancer of left colon. He presents to the clinic with his wife, daughter and granddaughter. Referred by Dr. Marcello Moores  He was diagnosed with screening colonoscopy by Dr. Penelope Coop at [place]. He was due for his colonoscopy: Before that He had intermittent sever stomach pain, and reported  abdominal bloating and swelling. He also had some constipation. He developed bowel obstruction after the colonoscopy and was admitted to hospital. He underwent left segmental colon resection by Dr. Marcello Moores on 06/09/2017.   today he feels fine since surgery. His incision is healing and stoma is well. His appetite is back to normal overall. Sometimes he will lower his intake when he afriad his bag will get too full  His bowel will vary from loose to formed. It's mostly is pasty. No blood in stool since surgery. he empties 2-3 times a day. He denies fever or chills. His activity is better but he still takes a nap when needed.  He plays music/string instruments. He is not to pick up on anything that could strain his stomach. He has tingling in his right hand fingers slightly since surgery. 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  He is HOH and wears hearing Aid. He is somewhat active and tries to walk. His family wanted to clear up the side of which his tumor was on and that delayed his surgery.   He plays music around town several times a week. wife wants to know will he get as much hep from pill as with IV chemo.  His family shares the questions with his chemo options.   Past, for his knee replacement he was on blood thinner and he notied blood in stool after surgery. He has not been diagnosed with any other significant issues. He takes tylenol wheen needed. 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 Ear surgery due to ear drum  burst at 16. 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 His maternal uncle had colon cancer.  REVIEW OF SYSTEMS:   Constitutional: Denies fevers, chills or abnormal night sweats Eyes: Denies blurriness of vision, double vision or watery eyes Ears, nose, mouth, throat, and face: Denies mucositis or sore throat. (+) Hearing Respiratory: Denies cough, dyspnea or wheezes Cardiovascular: Denies palpitation, chest discomfort or lower  extremity swelling Gastrointestinal:  Denies nausea, heartburn or change in bowel habits Skin: Denies abnormal skin rashes Lymphatics: Denies new lymphadenopathy or easy bruising Neurological:Denies numbness, tingling or new weaknesses Behavioral/Psych: Mood is stable, no new changes  All other systems were reviewed with the patient and are negative.  MEDICAL HISTORY:  Past Medical History:  Diagnosis Date  . Arthritis   . H/O vocal cord paralysis    left     SURGICAL HISTORY: Past Surgical History:  Procedure Laterality Date  . BACK SURGERY     times 2  . INNER EAR SURGERY     bilat  . LAPAROSCOPY N/A 06/09/2017   Procedure: LAPAROSCOPY DIAGNOSTIC, OPEN  LEFT COLECTOMY;  Surgeon: Leighton Ruff, MD;  Location: [facility];  Service: General;  Laterality: N/A;  . PROSTATE BIOPSY    . TONSILLECTOMY    . TOTAL KNEE ARTHROPLASTY Right 07/27/2016   Procedure: RIGHT TOTAL KNEE ARTHROPLASTY;  Surgeon: Gaynelle Arabian, MD;  Location: [facility];  Service: Orthopedics;  Laterality: Right;    SOCIAL HISTORY: Social History   Social History  . Marital status: Married    Spouse name: N/A  . Number of children: N/A  . Years of education: N/A   Occupational History  . Not on file.   Social History Main Topics  . Smoking status: Never Smoker  . Smokeless tobacco: Never Used  . Alcohol use No  . Drug use: No  . Sexual activity: Not on file   Other Topics Concern  . Not on file   Social History Narrative  . No narrative on file    FAMILY HISTORY: Family History  Problem Relation Age of Onset  . Heart failure Mother   . Stroke Father   . Cancer Maternal Uncle        colon cancer    ALLERGIES:  has No Known Allergies.  MEDICATIONS:  Current Outpatient Prescriptions  Medication Sig Dispense Refill  . acetaminophen (TYLENOL) 500 MG tablet Take 2 tablets (1,000 mg total) by mouth every 8 (eight) hours as needed. 30 tablet 0  . docusate sodium (COLACE) 100 MG capsule Take 200 mg  by mouth 2 (two) times daily as needed for mild constipation.    . polyethylene glycol (MIRALAX / GLYCOLAX) packet Take 17 g by mouth daily as needed for mild constipation.     No current facility-administered medications for this visit.     REVIEW OF SYSTEMS:   Constitutional: Denies fevers, chills or abnormal night sweats Eyes: Denies blurriness of vision, double vision or watery eyes Ears, nose, mouth, throat, and face: Denies mucositis or sore throat Respiratory: Denies cough, dyspnea or wheezes Cardiovascular: Denies palpitation, chest discomfort or lower extremity swelling Gastrointestinal:  Denies nausea, heartburn or change in bowel habits Skin: Denies abnormal skin rashes Lymphatics: Denies new lymphadenopathy or easy bruising Neurological:Denies numbness, tingling or new weaknesses Behavioral/Psych: Mood is stable, no new changes  All other systems were reviewed with the patient and are negative.  PHYSICAL EXAMINATION: ECOG PERFORMANCE STATUS: 1 - Symptomatic but completely ambulatory BP 168/85, HR 59,  RR 18, O2sat 100% on RA GENERAL:alert, no distress and comfortable SKIN: skin color, texture, turgor are normal, no rashes or significant lesions EYES: normal, conjunctiva are pink and non-injected, sclera clear OROPHARYNX:no exudate, no erythema and lips, buccal mucosa, and tongue normal  NECK: supple, thyroid normal size, non-tender, without nodularity LYMPH:  no palpable lymphadenopathy in the cervical, axillary or inguinal LUNGS: clear to auscultation and percussion with normal breathing effort HEART: regular rate & rhythm and no murmurs and no lower extremity edema ABDOMEN: abdomen soft, non-tender and normal bowel sounds, (+) colostomy bag, his midline incision has healed well  Musculoskeletal:no cyanosis of digits and no clubbing  PSYCH: alert & oriented x 3 with fluent speech NEURO: no focal motor/sensory deficits  LABORATORY DATA:  I have reviewed the data as  listed CBC Latest Ref Rng & Units 06/12/2017 06/11/2017 06/10/2017  WBC 4.0 - 10.5 K/uL 14.3(H) 17.2(H) 13.4(H)  Hemoglobin 13.0 - 17.0 g/dL 9.1(L) 10.3(L) 9.0(L)  Hematocrit 39.0 - 52.0 % 29.4(L) 32.5(L) 28.5(L)  Platelets 150 - 400 K/uL 229 255 164    CMP Latest Ref Rng & Units 06/12/2017 06/10/2017 06/09/2017  Glucose 65 - 99 mg/dL 122(H) 134(H) 99  BUN 6 - 20 mg/dL _0 Creatinine 0.61 - 1.24 mg/dL 0.98 1.07 1.14  Sodium 135 - 145 mmol/L 136 135 139  Potassium 3.5 - 5.1 mmol/L 4.2 4.3 4.1  Chloride 101 - 111 mmol/L 101 100(L) 106  CO2 22 - 32 mmol/L _1 Calcium 8.9 - 10.3 mg/dL 8.4(L) 8.3(L) 8.4(L)  Total Protein 6.5 - 8.1 g/dL - - -  Total Bilirubin 0.3 - 1.2 mg/dL - - -  Alkaline Phos 38 - 126 U/L - - -  AST 15 - 41 U/L - - -  ALT 17 - 63 U/L - - -   CEA (<5.6ng/ml) 06/09/2017: 1.8   PATHOLOGY  Diagnosis 06/09/17 Colon, segmental resection for tumor, left - INVASIVE ADENOCARCINOMA, WELL DIFFERENTIATED, SPANNING 5 CM. - TUMOR INVADES THROUGH MUSCULARIS PROPRIA INTO SUBSEROSAL TISSUE. - RESECTION MARGINS ARE NEGATIVE. - METASTATIC CARCINOMA IN TWO OF TWENTY-SEVEN LYMPH NODES (2/27). - SEE ONCOLOGY TABLE. Microscopic Comment COLON AND RECTUM (INCLUDING TRANS-ANAL RESECTION): Specimen: Left colon. Procedure: Segmental resection. Tumor site: Transverse colon. Specimen integrity: Intact. Macroscopic tumor perforation: Not identified. Invasive tumor: Maximum size: 5 cm Histologic type(s): Invasive adenocarcinoma. Histologic grade and differentiation: G1: well differentiated/low grade Type of polyp in which invasive carcinoma arose: N/A. Microscopic extension of invasive tumor: Tumor invades through muscularis propria into subserosal tissue. Lymph-Vascular invasion: Definitive invasion not identified. Peri-neural invasion: Not identified. Tumor deposit(s) (discontinuous extramural extension): Not identified. Resection margins: Proximal margin: Negative. Distal  margin: Negative. Circumferential (radial) (posterior ascending, posterior descending; lateral and posterior mid-rectum; and entire lower 1/3 rectum): Negative. Mesenteric margin (sigmoid and transverse): Negative. Distance closest margin (if all above margins negative): 2 cm (mesenteric). Treatment effect (neo-adjuvant therapy): N/A. Microscopic Comment(continued) Additional polyp(s): None. Non-neoplastic findings: Diverticulosis. Lymph nodes: number examined 27; number positive: 2 Pathologic Staging: pT3, pN1b, pMX Ancillary studies: MSI and MMR will be ordered.   RADIOGRAPHIC STUDIES: I have personally reviewed the radiological images as listed and agreed with the findings in the report. Ct Abdomen Pelvis W Contrast  Result Date: 06/08/2017 CLINICAL DATA:  71 y/o  M; obstructing colon mass. EXAM: CT ABDOMEN AND PELVIS WITH CONTRAST TECHNIQUE: Multidetector CT imaging of the abdomen and pelvis was performed using the standard protocol following bolus administration of intravenous contrast. CONTRAST:  141m ISOVUE-300  IOPAMIDOL (ISOVUE-300) INJECTION 61% COMPARISON:  None. FINDINGS: Lower chest: 4 mm juxtapleural nodule of the right middle lobe (series 6, image 18). Hepatobiliary: No focal liver abnormality is seen. No gallstones, gallbladder wall thickening, or biliary dilatation. Pancreas: Unremarkable. No pancreatic ductal dilatation or surrounding inflammatory changes. Spleen: Normal in size without focal abnormality. Adrenals/Urinary Tract: Numerous renal cysts measuring up to 8.6 cm in the left kidney lower pole. Right kidney 2 mm interpolar nonobstructing stone. Normal adrenal glands. No hydronephrosis. Normal bladder. Stomach/Bowel: Mass of the transverse colon near the splenic flexure measuring 4 cm in length (Series 3, image 66, series 4, image 165, series 2, image 24). Proximal dilatation of the ascending and transverse colon due to obstruction. Mild inflammatory changes of the cecum.  Normal small bowel. Vascular/Lymphatic: No lymphadenopathy by imaging criteria. Reproductive: Prostate enlargement, 6.6 x 7.2 x 6.1 cm (volume = 150 cm^3). Other: Small paraumbilical hernia containing fat. Bilateral fat containing inguinal hernias. Musculoskeletal: No acute fracture or suspicious osseous lesion. Lumbar degenerative changes post L5-S1 interbody fusion. IMPRESSION: 1. Mass of the transverse colon near splenic flexure measuring up to 4 cm compatible with colonic adenocarcinoma. No lymphadenopathy by imaging criteria. 2. Proximal dilatation of ascending and transverse colon with mild inflammatory changes likely due to obstruction. 3. 4 mm right middle lobe pulmonary nodule, attention on follow-up recommended. 4. 2 mm right kidney nonobstructing nephrolithiasis. Electronically Signed   By: Kristine Garbe M.D.   On: 06/08/2017 14:25    ASSESSMENT & PLAN:  James Doyle is a 71 y.o. male who has a history of arthritis.   1. Left colon cancer, invasive adenocarcinoma, G1, pT3N1bM0, stage IIIB, MSI-stable  -We reveiwed his colon cancer and pathology results. it's stage IIIB with 2 out of 27 positive lymph nodes.  -staging CT abd/pel  Was negative for metastatic disease. I will obtain a CT chest also to complete staging.  -His tumor was resected completely and he is cancer free now. However, he does  have a high risk of recurrence due to his locally advanced disease.  -we discussed adjuvant chemo is recommend and standard care for stage III colon cancer. So I strongly suggest he consider aduvant chemotherapy to reduce his risk of recurrence.  -We reviewed his chemo options, including single agent Xeloda or 5-FU for 6 months, or more standard option of iv chemo Oxaliplatin and 5-fu Pump (FOLFOX) every 2 weeks for 3 months.  -Side effects of each regimen were discussed in great detail --Chemotherapy consent: Side effects including but does not not limited to, fatigue, nausea, vomiting,  diarrhea, hair loss, neuropathy, fluid retention, renal and kidney dysfunction, neutropenic fever, needed for blood transfusion, bleeding, heart attack and heart failure, were discussed with patient in great detail.  -Pt is agreeable with chemo, but would like to think about which regimen to take. He will call me back next week with his decision --I discussed the risk of cancer recurrence in the future. I discussed the surveillance plan, which is a physical exam and lab test (including CBC, CMP and CEA) every 3 months for the first 2 years, then every 6-12 months, colonoscopy in one year, and surveilliance CT scan every 6-12 month for up to 5 year.   2 osteoarthritis  -f/u and monitoring     PLAN:  Get Colonoscopy results from Dr. Winnifred Friar  CT Chest WO Contrast next week He will call me with his decision of chemo regimen  Chemo class next week I plan to see him back  before first cycle chemo in about 2 weeks      No orders of the defined types were placed in this encounter.   All questions were answered. The patient knows to call the clinic with any problems, questions or concerns. I spent 55 minutes counseling the patient face to face. The total time spent in the appointment was 60 minutes and more than 50% was on counseling.  This document serves as a record of services personally performed by Truitt Merle, MD. It was created on her behalf by Joslyn Devon, a trained medical scribe. The creation of this record is based on the scribe's personal observations and the provider's statements to them. This document has been checked and approved by the attending provider.     Truitt Merle, MD 07/07/2017

## 2017-07-09 ENCOUNTER — Telehealth: Payer: Self-pay | Admitting: Hematology

## 2017-07-09 NOTE — Telephone Encounter (Signed)
Spoke with patient regarding his upcoming appointments.

## 2017-07-12 ENCOUNTER — Other Ambulatory Visit: Payer: Medicare Other

## 2017-07-13 ENCOUNTER — Other Ambulatory Visit: Payer: Medicare Other

## 2017-07-14 ENCOUNTER — Telehealth: Payer: Self-pay | Admitting: *Deleted

## 2017-07-14 ENCOUNTER — Other Ambulatory Visit: Payer: Self-pay | Admitting: *Deleted

## 2017-07-14 ENCOUNTER — Encounter: Payer: Self-pay | Admitting: *Deleted

## 2017-07-14 NOTE — Telephone Encounter (Signed)
Pt called and left message informing Dr. Burr Medico re:  Pt has decided to take chemo pill. Pt's     Phone      [phone].

## 2017-07-15 ENCOUNTER — Telehealth: Payer: Self-pay

## 2017-07-15 NOTE — Telephone Encounter (Signed)
lvm 2nd call

## 2017-07-15 NOTE — Telephone Encounter (Signed)
Pt called with a question

## 2017-07-20 ENCOUNTER — Ambulatory Visit (HOSPITAL_COMMUNITY)
Admission: RE | Admit: 2017-07-20 | Discharge: 2017-07-20 | Disposition: A | Discharge status: Home / Self Care | Payer: Medicare Other | Source: Ambulatory Visit | Attending: Hematology | Admitting: Hematology

## 2017-07-20 DIAGNOSIS — I2510 Atherosclerotic heart disease of native coronary artery without angina pectoris: Secondary | ICD-10-CM | POA: Diagnosis not present

## 2017-07-20 DIAGNOSIS — I700 Atherosclerosis of aorta: Secondary | ICD-10-CM | POA: Diagnosis not present

## 2017-07-20 DIAGNOSIS — C186 Malignant neoplasm of descending colon: Secondary | ICD-10-CM

## 2017-07-20 DIAGNOSIS — N281 Cyst of kidney, acquired: Secondary | ICD-10-CM | POA: Insufficient documentation

## 2017-07-22 ENCOUNTER — Telehealth: Payer: Self-pay | Admitting: Hematology

## 2017-07-22 ENCOUNTER — Ambulatory Visit (HOSPITAL_BASED_OUTPATIENT_CLINIC_OR_DEPARTMENT_OTHER): Payer: Medicare Other | Admitting: Hematology

## 2017-07-22 ENCOUNTER — Other Ambulatory Visit (HOSPITAL_BASED_OUTPATIENT_CLINIC_OR_DEPARTMENT_OTHER): Payer: Medicare Other

## 2017-07-22 VITALS — BP 153/83 | HR 61 | Temp 98.6°F | Resp 20 | Ht 73.0 in | Wt 232.9 lb

## 2017-07-22 DIAGNOSIS — C186 Malignant neoplasm of descending colon: Secondary | ICD-10-CM

## 2017-07-22 DIAGNOSIS — M1990 Unspecified osteoarthritis, unspecified site: Secondary | ICD-10-CM

## 2017-07-22 LAB — CBC WITH DIFFERENTIAL/PLATELET
BASO%: 0.2 % (ref 0.0–2.0)
BASOS ABS: 0 10*3/uL (ref 0.0–0.1)
EOS ABS: 0.2 10*3/uL (ref 0.0–0.5)
EOS%: 2.2 % (ref 0.0–7.0)
HCT: 32.4 % — ABNORMAL LOW (ref 38.4–49.9)
HEMOGLOBIN: 9.8 g/dL — AB (ref 13.0–17.1)
LYMPH%: 20.4 % (ref 14.0–49.0)
MCH: 22.5 pg — AB (ref 27.2–33.4)
MCHC: 30.2 g/dL — ABNORMAL LOW (ref 32.0–36.0)
MCV: 74.5 fL — AB (ref 79.3–98.0)
MONO#: 1 10*3/uL — ABNORMAL HIGH (ref 0.1–0.9)
MONO%: 9.9 % (ref 0.0–14.0)
NEUT%: 67.3 % (ref 39.0–75.0)
NEUTROS ABS: 7.1 10*3/uL — AB (ref 1.5–6.5)
Platelets: 176 10*3/uL (ref 140–400)
RBC: 4.35 10*6/uL (ref 4.20–5.82)
RDW: 16.1 % — AB (ref 11.0–14.6)
WBC: 10.6 10*3/uL — AB (ref 4.0–10.3)
lymph#: 2.2 10*3/uL (ref 0.9–3.3)

## 2017-07-22 LAB — COMPREHENSIVE METABOLIC PANEL
ALBUMIN: 3.8 g/dL (ref 3.5–5.0)
ALT: 14 U/L (ref 0–55)
AST: 16 U/L (ref 5–34)
Alkaline Phosphatase: 92 U/L (ref 40–150)
Anion Gap: 5 mEq/L (ref 3–11)
BUN: 15.1 mg/dL (ref 7.0–26.0)
CO2: 29 meq/L (ref 22–29)
Calcium: 9.8 mg/dL (ref 8.4–10.4)
Chloride: 105 mEq/L (ref 98–109)
Creatinine: 1.1 mg/dL (ref 0.7–1.3)
EGFR: 68 mL/min/{1.73_m2} — AB (ref >90)
GLUCOSE: 99 mg/dL (ref 70–140)
POTASSIUM: 4 meq/L (ref 3.5–5.1)
SODIUM: 140 meq/L (ref 136–145)
Total Bilirubin: 0.4 mg/dL (ref 0.20–1.20)
Total Protein: 7.2 g/dL (ref 6.4–8.3)

## 2017-07-22 LAB — CEA (IN HOUSE-CHCC): CEA (CHCC-In House): 1.34 ng/mL (ref 0.00–5.00)

## 2017-07-22 MED ORDER — CAPECITABINE 500 MG PO TABS: ORAL_TABLET | ORAL | 0 refills | Status: DC

## 2017-07-22 NOTE — Progress Notes (Signed)
 [facility]  [phone] [phone]  Clinic Follow Up Note   Patient Care Team: Mitchell, L.Dean, MD as PCP - General (Family Medicine) Thomas, Alicia, MD as Consulting Physician (General Surgery) , , MD as Consulting Physician (Hematology) Ganem, Salem F, MD as Consulting Physician (Gastroenterology) 07/22/2017   CHIEF COMPLAINTS:  Follow Up for Colon Cancer  Oncology History   Cancer Staging Cancer of left colon (HCC) Staging form: Colon and Rectum, AJCC 8th Edition - Pathologic stage from 06/09/2017: Stage IIIB (pT3, pN1b, cM0) - Signed by , , MD on 07/06/2017       Cancer of left colon (HCC)   06/08/2017 Initial Diagnosis    Cancer of left colon (HCC)     06/08/2017 Imaging    CT AP W Contrast 06/08/17 IMPRESSION: 1. Mass of the transverse colon near splenic flexure measuring up to 4 cm compatible with colonic adenocarcinoma. No lymphadenopathy by imaging criteria. 2. Proximal dilatation of ascending and transverse colon with mild inflammatory changes likely due to obstruction. 3. 4 mm right middle lobe pulmonary nodule, attention on follow-up recommended. 4. 2 mm right kidney nonobstructing nephrolithiasis.      06/09/2017 Surgery    LAPAROSCOPY DIAGNOSTIC, OPEN LEFT COLECTOMY by Dr. Thomas      06/09/2017 Pathology Results    Diagnosis 06/09/17 Colon, segmental resection for tumor, left - INVASIVE ADENOCARCINOMA, WELL DIFFERENTIATED, SPANNING 5 CM. - TUMOR INVADES THROUGH MUSCULARIS PROPRIA INTO SUBSEROSAL TISSUE. - RESECTION MARGINS ARE NEGATIVE. - METASTATIC CARCINOMA IN TWO OF TWENTY-SEVEN LYMPH NODES (2/27). - SEE ONCOLOGY TABLE.       07/20/2017 Imaging    CT Chest W Contrast IMPRESSION: No evidence of metastatic disease or other acute findings within the thorax.        HISTORY OF PRESENTING ILLNESS: 07/07/17 James Doyle 71 y.o. male is here because of newly diagnosed cancer of left colon. He  presents to the clinic with his wife, daughter and granddaughter. Referred by Dr. Thomas  He was diagnosed with screening colonoscopy by Dr. Ganem at [facility]. He was due for his colonoscopy: Before that He had intermittent sever stomach pain, and reported abdominal bloating and swelling. He also had some constipation. He developed bowel obstruction after the colonoscopy and was admitted to hospital. He underwent left segmental colon resection by Dr. Thomas on 06/09/2017.   today he feels fine since surgery. His incision is healing and stoma is well. His appetite is back to normal overall. Sometimes he will lower his intake when he afriad his bag will get too full  His bowel will vary from loose to formed. It's mostly is pasty. No blood in stool since surgery. he empties 2-3 times a day. He denies fever or chills. His activity is better but he still takes a nap when needed.  He plays music/string instruments. He is not to pick up on anything that could strain his stomach. He has tingling in his right hand fingers slightly since surgery. He is now retired and previous work in machanics and a mchine shop.  He is HOH and wears hearing Aid. He is somewhat active and tries to walk. His family wanted to clear up the side of which his tumor was on and that delayed his surgery.   He plays music around town several times a week. wife wants to know will he get as much hep from pill as with IV chemo.  His family shares the questions with his chemo options.   Past,   for his knee replacement he was on blood thinner and he notied blood in stool after surgery. He has not been diagnosed with any other significant issues. He takes tylenol wheen needed. 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 Ear surgery due to ear drum burst at 16. 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 His maternal uncle had colon cancer.  CURRENT THERAPY: PENDING Xeloda  INTERVAL HISTORY: Today he presents to the  clinic for follow up. He is eager to know his results from CT from this week. He has been doing well overall. Since his surgery, he has been healing well.   Per his wife, she is interested in any supplements that will help his immune system stay high.  REVIEW OF SYSTEMS:   Constitutional: Denies fevers, chills or abnormal night sweats Eyes: Denies blurriness of vision, double vision or watery eyes Ears, nose, mouth, throat, and face: Denies mucositis or sore throat. (+) Hearing Respiratory: Denies cough, dyspnea or wheezes Cardiovascular: Denies palpitation, chest discomfort or lower extremity swelling Gastrointestinal:  Denies nausea, heartburn or change in bowel habits Skin: Denies abnormal skin rashes Lymphatics: Denies new lymphadenopathy or easy bruising Neurological:Denies numbness, tingling or new weaknesses Behavioral/Psych: Mood is stable, no new changes  All other systems were reviewed with the patient and are negative.  MEDICAL HISTORY:  Past Medical History:  Diagnosis Date  . Arthritis   . H/O vocal cord paralysis    left     SURGICAL HISTORY: Past Surgical History:  Procedure Laterality Date  . BACK SURGERY     times 2  . INNER EAR SURGERY     bilat  . LAPAROSCOPY N/A 06/09/2017   Procedure: LAPAROSCOPY DIAGNOSTIC, OPEN  LEFT COLECTOMY;  Surgeon: Leighton Ruff, MD;  Location: [facility];  Service: General;  Laterality: N/A;  . PROSTATE BIOPSY    . TONSILLECTOMY    . TOTAL KNEE ARTHROPLASTY Right 07/27/2016   Procedure: RIGHT TOTAL KNEE ARTHROPLASTY;  Surgeon: Gaynelle Arabian, MD;  Location: [facility];  Service: Orthopedics;  Laterality: Right;    SOCIAL HISTORY: Social History   Social History  . Marital status: Married    Spouse name: N/A  . Number of children: N/A  . Years of education: N/A   Occupational History  . Not on file.   Social History Main Topics  . Smoking status: Never Smoker  . Smokeless tobacco: Never Used  . Alcohol use No  . Drug use: No    . Sexual activity: Not on file   Other Topics Concern  . Not on file   Social History Narrative  . No narrative on file    FAMILY HISTORY: Family History  Problem Relation Age of Onset  . Heart failure Mother   . Stroke Father   . Cancer Maternal Uncle        colon cancer    ALLERGIES:  has No Known Allergies.  MEDICATIONS:  Current Outpatient Prescriptions  Medication Sig Dispense Refill  . docusate sodium (COLACE) 100 MG capsule Take 200 mg by mouth 2 (two) times daily as needed for mild constipation.    Marland Kitchen acetaminophen (TYLENOL) 500 MG tablet Take 2 tablets (1,000 mg total) by mouth every 8 (eight) hours as needed. (Patient not taking: Reported on 07/22/2017) 30 tablet 0  . capecitabine (XELODA) 500 MG tablet Take 4 tab every 12 hours for the first three days, then increase to 5 tab in the morning and 4 tab in the evening, for  a total of 14 days, then off for 7 days 126 tablet 0  . polyethylene glycol (MIRALAX / GLYCOLAX) packet Take 17 g by mouth daily as needed for mild constipation.     No current facility-administered medications for this visit.     REVIEW OF SYSTEMS:   Constitutional: Denies fevers, chills or abnormal night sweats Eyes: Denies blurriness of vision, double vision or watery eyes Ears, nose, mouth, throat, and face: Denies mucositis or sore throat Respiratory: Denies cough, dyspnea or wheezes Cardiovascular: Denies palpitation, chest discomfort or lower extremity swelling Gastrointestinal:  Denies nausea, heartburn or change in bowel habits Skin: Denies abnormal skin rashes Lymphatics: Denies new lymphadenopathy or easy bruising Neurological:Denies numbness, tingling or new weaknesses Behavioral/Psych: Mood is stable, no new changes  All other systems were reviewed with the patient and are negative.  PHYSICAL EXAMINATION:  ECOG PERFORMANCE STATUS: 1 - Symptomatic but completely ambulatory BP (!) 153/83 (BP Location: Left Arm, Patient Position:  Sitting)   Pulse 61   Temp 98.6 F (37 C) (Oral)   Resp 20   Ht 6' 1" (1.854 m)   Wt 232 lb 14.4 oz (105.6 kg)   SpO2 100%   BMI 30.73 kg/m  GENERAL:alert, no distress and comfortable SKIN: skin color, texture, turgor are normal, no rashes or significant lesions EYES: normal, conjunctiva are pink and non-injected, sclera clear OROPHARYNX:no exudate, no erythema and lips, buccal mucosa, and tongue normal  NECK: supple, thyroid normal size, non-tender, without nodularity LYMPH:  no palpable lymphadenopathy in the cervical, axillary or inguinal LUNGS: clear to auscultation and percussion with normal breathing effort HEART: regular rate & rhythm and no murmurs and no lower extremity edema ABDOMEN: abdomen soft, non-tender and normal bowel sounds, (+) colostomy bag, his midline incision has healed well  Musculoskeletal:no cyanosis of digits and no clubbing  PSYCH: alert & oriented x 3 with fluent speech NEURO: no focal motor/sensory deficits  LABORATORY DATA:  I have reviewed the data as listed CBC Latest Ref Rng & Units 07/22/2017 06/12/2017 06/11/2017  WBC 4.0 - 10.3 10e3/uL 10.6(H) 14.3(H) 17.2(H)  Hemoglobin 13.0 - 17.1 g/dL 9.8(L) 9.1(L) 10.3(L)  Hematocrit 38.4 - 49.9 % 32.4(L) 29.4(L) 32.5(L)  Platelets 140 - 400 10e3/uL 176 229 255    CMP Latest Ref Rng & Units 07/22/2017 06/12/2017 06/10/2017  Glucose 70 - 140 mg/dl 99 122(H) 134(H)  BUN 7.0 - 26.0 mg/dL 15.1 10 9  Creatinine 0.7 - 1.3 mg/dL 1.1 0.98 1.07  Sodium 136 - 145 mEq/L 140 136 135  Potassium 3.5 - 5.1 mEq/L 4.0 4.2 4.3  Chloride 101 - 111 mmol/L - 101 100(L)  CO2 22 - 29 mEq/L 29 29 29  Calcium 8.4 - 10.4 mg/dL 9.8 8.4(L) 8.3(L)  Total Protein 6.4 - 8.3 g/dL 7.2 - -  Total Bilirubin 0.20 - 1.20 mg/dL 0.40 - -  Alkaline Phos 40 - 150 U/L 92 - -  AST 5 - 34 U/L 16 - -  ALT 0 - 55 U/L 14 - -   CEA (<5.6ng/ml) 06/09/2017: 1.8   PATHOLOGY  Diagnosis 06/09/17 Colon, segmental resection for tumor, left - INVASIVE  ADENOCARCINOMA, WELL DIFFERENTIATED, SPANNING 5 CM. - TUMOR INVADES THROUGH MUSCULARIS PROPRIA INTO SUBSEROSAL TISSUE. - RESECTION MARGINS ARE NEGATIVE. - METASTATIC CARCINOMA IN TWO OF TWENTY-SEVEN LYMPH NODES (2/27). - SEE ONCOLOGY TABLE. Microscopic Comment COLON AND RECTUM (INCLUDING TRANS-ANAL RESECTION): Specimen: Left colon. Procedure: Segmental resection. Tumor site: Transverse colon. Specimen integrity: Intact. Macroscopic tumor perforation: Not identified. Invasive tumor:   Maximum size: 5 cm Histologic type(s): Invasive adenocarcinoma. Histologic grade and differentiation: G1: well differentiated/low grade Type of polyp in which invasive carcinoma arose: N/A. Microscopic extension of invasive tumor: Tumor invades through muscularis propria into subserosal tissue. Lymph-Vascular invasion: Definitive invasion not identified. Peri-neural invasion: Not identified. Tumor deposit(s) (discontinuous extramural extension): Not identified. Resection margins: Proximal margin: Negative. Distal margin: Negative. Circumferential (radial) (posterior ascending, posterior descending; lateral and posterior mid-rectum; and entire lower 1/3 rectum): Negative. Mesenteric margin (sigmoid and transverse): Negative. Distance closest margin (if all above margins negative): 2 cm (mesenteric). Treatment effect (neo-adjuvant therapy): N/A. Microscopic Comment(continued) Additional polyp(s): None. Non-neoplastic findings: Diverticulosis. Lymph nodes: number examined 27; number positive: 2 Pathologic Staging: pT3, pN1b, pMX Ancillary studies: MSI and MMR will be ordered.   RADIOGRAPHIC STUDIES: I have personally reviewed the radiological images as listed and agreed with the findings in the report. Ct Chest Wo Contrast  Result Date: 07/20/2017 CLINICAL DATA:  Recently diagnosed left colon carcinoma. Staging workup. EXAM: CT CHEST WITHOUT CONTRAST TECHNIQUE: Multidetector CT imaging of the chest  was performed following the standard protocol without IV contrast. COMPARISON:  None. FINDINGS: Cardiovascular: No acute findings. Aortic and coronary artery atherosclerosis. Prior median sternotomy. Mediastinum/Nodes: No masses or pathologically enlarged lymph nodes identified on this unenhanced exam. Lungs/Pleura: No pulmonary infiltrate or mass identified. No suspicious pulmonary nodules identified. No effusion present. Upper Abdomen: Fluid attenuation cysts noted in visualized portions of kidneys. Musculoskeletal:  No suspicious bone lesions. IMPRESSION: No evidence of metastatic disease or other acute findings within the thorax. Aortic Atherosclerosis (ICD10-I70.0). Electronically Signed   By: John  Stahl M.D.   On: 07/20/2017 16:09    ASSESSMENT & PLAN:  James Doyle is a 71 y.o. male who has a history of arthritis.   1. Left colon cancer, invasive adenocarcinoma, G1, pT3N1bM0, stage IIIB, MSI-stable  -We reveiwed his colon cancer and pathology results. it's stage IIIB with 2 out of 27 positive lymph nodes.  -staging CT abd/pel  Was negative for metastatic disease. -His CT chest was negative for metastasis, I reviewed the results with patient -His tumor was resected completely and he is cancer free now. However, he does  have a high risk of recurrence due to his locally advanced disease.  -we discussed adjuvant chemo is recommend and standard care for stage III colon cancer. So I strongly suggest he consider aduvant chemotherapy to reduce his risk of recurrence.  -We reviewed his chemo options, including single agent Xeloda or 5-FU for 6 months, or more standard option of iv chemo Oxaliplatin and 5-fu Pump (FOLFOX) every 2 weeks for 3 months.  -Patient has decided to take Xeloda alone, he declined intravenous chemotherapy. --Chemotherapy consent: Side effects including but does not not limited to, fatigue, nausea, vomiting, diarrhea, hair loss, neuropathy, fluid retention, renal and kidney  dysfunction, neutropenic fever, needed for blood transfusion, bleeding, heart attack and heart failure, hand and palm syndrome, were discussed with patient in great detail.  -The goal of therapy is curative. -I'll send his Xeloda prescription to his special comes in today, we discussed the logistics and potential financial burden and may need financial assistance, it may take up to 2 weeks to start  I encouraged him to drink at least 40 oz of water daily and avoid high amounts of natural supplements - To help with his nausea, he can try ginger candy to help. I will prescribe Compazine for him today - near his last cycle of chemo, I will refer him back   to surgeon for colostomy revision - I discussed the first 2 years after surgery are the highest for possible reoccurrence. Plan to monitor for 5 years.  2 osteoarthritis  -f/u and monitoring    PLAN:  -I ordered Xeloda today, he will start when he receives the medication and inform us. He will start from 2000 mg twice daily, for 5 days, then increase to 2559m am and 20049mpm, he will take for 14 days, then off for 7 days - prescribe Compazine today for nausea  - f/u week of Sept 17 - add updated home phone number  No orders of the defined types were placed in this encounter.   All questions were answered. The patient knows to call the clinic with any problems, questions or concerns. I spent 25 minutes counseling the patient face to face. The total time spent in the appointment was 30 minutes and more than 50% was on counseling.  This document serves as a record of services personally performed by YaTruitt MerleMD. It was created on her behalf by TaBrandt Loosena trained medical scribe. The creation of this record is based on the scribe's personal observations and the provider's statements to them. This document has been checked and approved by the attending provider.     FeTruitt MerleMD 07/22/2017

## 2017-07-22 NOTE — Telephone Encounter (Signed)
Gave patient avs and calendar with upcoming appts.  °

## 2017-07-24 ENCOUNTER — Encounter: Payer: Self-pay | Admitting: Hematology

## 2017-07-26 ENCOUNTER — Telehealth: Payer: Self-pay | Admitting: Pharmacist

## 2017-07-26 ENCOUNTER — Encounter (HOSPITAL_COMMUNITY): Payer: Self-pay | Admitting: General Surgery

## 2017-07-26 DIAGNOSIS — C186 Malignant neoplasm of descending colon: Secondary | ICD-10-CM

## 2017-07-26 MED ORDER — CAPECITABINE 500 MG PO TABS: ORAL_TABLET | ORAL | 0 refills | Status: DC

## 2017-07-26 NOTE — Telephone Encounter (Signed)
Oral Oncology Pharmacist Encounter  Received new prescription for Xeloda for the adjuvant treatment of colon cancer, planned duration 6 months.  Labs from 07/22/17 assessed, OK for treatment.  Current medication list in Epic reviewed, no significant DDIs with Xeloda identified.  Prescription has been e-scribed to the [facility] for benefits analysis and approval. No prior authorization required. Copayment $162.53 per 3 week fill, plan for 8 cycles. This will be discussed with patient.  I LVM for patient with offer for initial counseling and discussion of medication acquisition.  Oral Oncology Clinic will continue to follow for copayment issues, initial counseling and start date.  Johny Drilling, PharmD, BCPS, BCOP 07/26/2017 2:07 PM Oral Oncology Clinic [phone]

## 2017-07-27 ENCOUNTER — Telehealth: Payer: Self-pay | Admitting: *Deleted

## 2017-07-27 MED FILL — CAPECITABINE 500 MG TABLET: 500 | 21 days supply | Qty: 123 | Fill #0

## 2017-07-27 NOTE — Telephone Encounter (Signed)
Oral Chemotherapy Pharmacist Encounter   I spoke with patient for overview of new oral chemotherapy medication: Xeloda.   Counseled patient on administration, dosing, side effects, safe handling, and monitoring.  Patient will take Xeloda 500mg  tablets, 5 tablets (2500mg ) by mouth in AM and 4 tabs (2000mg ) by mouth in PM, within 27min of finishing a meal, for 14 days on, 7 days off, repeat every 21 days for planned 8 cycles. Patient understands he will take Xeloda 500mg  tablets, 4 tablets (2000mg ) in AM and 4 tabs (2000mg ) in PM for 1st 3 days of 1st cycle, then will increase to full dose for remaining 11 days to complete 1st cycle. Cycle 2 is planned to start at full dose.  Side effects include but not limited to: fatigue, hand-foot syndrome, GI upset, diarrhea, and decreased blood counts.    Reviewed with patient importance of keeping a medication schedule and plan for any missed doses.  James Doyle voiced understanding and appreciation.   All questions answered. Patient stated he did not understand originally that his dose would be 9 tablets daily and expressed concern in being able to tolerate this for 6 months. He also stated he would not likely be able to afford 6 months of copayments every 3 weeks and would like to discuss his treatment options again with Dr. Burr Medico.  Will follow up with MD about treatment plan and when to fill Xeloda after patient is agreeable   Patient knows to call the office with questions or concerns. Oral Oncology Clinic will continue to follow.  Thank you,  James Doyle, PharmD, BCPS, BCOP 07/27/2017  1:26 PM Oral Oncology Clinic [phone]

## 2017-07-27 NOTE — Telephone Encounter (Signed)
"  I was told this is a new pill.  Was told to take five pills in the morning and four pills on the evening.  Why am I taking a new pill?  Why so many pills?  Dr. Burr Medico said one pill."  Shared oral chemotherapy dosing per height and weight, current Sig.  take 30 minutes after breakfast and dinner, monitor side effects for any dose adjustments with patient during this call.    "I'll pick it up tomorrow from [facility] but won't start tomorrow because it will be after breakfast.  I'll call in the morning when I pick it up.  Could Dr. Burr Medico call me [phone]..  If not that's okay."

## 2017-07-28 NOTE — Telephone Encounter (Signed)
Oral Chemotherapy Pharmacist Encounter   I spoke with patient and wife again today for overview of: Xeloda for the adjuvant treatment of colon cancer, planned duration 6 months.   They were at the pharmacy with a few more questions prior to starting on treatment.  Counseled patient on administration, dosing, side effects, safe handling, and monitoring. Patient will take Xeloda 500mg  tablets, 5 tablets (2500mg ) by mouth in AM and 4 tabs (2000mg ) by mouth in PM, within 78min of finishing a meal, for 14 days on, 7 days off, repeat every 21 days for planned 8 cycles. Patient understands he will take Xeloda 500mg  tablets, 4 tablets (2000mg ) in AM and 4 tabs (2000mg ) in PM for 1st 3 days of 1st cycle, then will increase to full dose for remaining 11 days to complete 1st cycle. Cycle 2 is planned to start at full dose.  Patient stated he did not need copayment assistance. Xeloda start date: 07/29/17  Patient knows to call the office with questions or concerns. Oral Oncology Clinic will continue to follow.  Thank you,  Johny Drilling, PharmD, BCPS, BCOP 07/28/2017  2:16 PM Oral Oncology Clinic [phone]

## 2017-07-28 NOTE — Telephone Encounter (Signed)
1:58 pm:  "Just returned from pharmacy.  Have picked up Xeloda.   1. Should we wait until Monday to begin this to count the weeks better?      2. Colostomy has moved today.  Usually empties soft formed stool, two to three times daily.  Uses stool softeners and Gwendolyn Lima for bowels to move. 3. When can colostomy be reversed?  Is it true the colostomy cannot be reversed until after he completes chemotherapy? 4. We've also heard if he does not complete chemotherapy within six months, the colostomy can never be reversed?"  Routing call information to collaborative nurse and provider for review.  Further patient communication through collaborative nurse. This nurse advised he begin tomorrow morning.  Next F/U 08-18-2017.  Expressed yesterday he was not able to pick up before 6:00 pm to begin today and did not want to begin today because could not pick up early morning.  Suggested a calendar to record or track the dosing for three day, eleven day and seven day rest.  Call anytime for changes or new symptoms.  Reviewed after hours communication.  [place] office closed 08-02-2017, Labor Day.  Reviewed side effect of diarrhea encouraging drinking 64 oz water daily in addition to what he drinks with meals to prevent dehydration.  No further questions at this time.

## 2017-08-04 ENCOUNTER — Telehealth: Payer: Self-pay | Admitting: Pharmacy Technician

## 2017-08-04 NOTE — Telephone Encounter (Signed)
Oral Oncology Patient Advocate Encounter  Was successful in securing patient a $ 10,000 grant from [facility] to provide copayment coverage for his Xeloda.  This will keep the out of pocket expense at $0.    I have spoken with the patient.    The billing information is as follows and has been shared with [place].   Member ID: [id number] Group ID: [id number] RxBin: [id number] Dates of Eligibility: 07/05/2017 through 07/04/2018  James Doyle. [name], [place] Patient [place] [phone] 08/04/2017 9:42 AM

## 2017-08-10 ENCOUNTER — Other Ambulatory Visit: Payer: Self-pay | Admitting: Hematology

## 2017-08-10 DIAGNOSIS — C186 Malignant neoplasm of descending colon: Secondary | ICD-10-CM

## 2017-08-12 NOTE — Progress Notes (Signed)
[place]  [phone] [phone]  Clinic Follow Up Note   Patient Care Team: Alroy Dust, L.Marlou Sa, MD as PCP - General (Family Medicine) Leighton Ruff, MD as Consulting Physician (General Surgery) Truitt Merle, MD as Consulting Physician (Hematology) Wonda Horner, MD as Consulting Physician (Gastroenterology) 08/18/2017   CHIEF COMPLAINTS:  Follow Up for Colon Cancer  Oncology History   Cancer Staging Cancer of left colon [facility]) Staging form: Colon and Rectum, AJCC 8th Edition - Pathologic stage from 06/09/2017: Stage IIIB (pT3, pN1b, cM0) - Signed by Truitt Merle, MD on 07/06/2017       Cancer of left colon ([place])   06/08/2017 Initial Diagnosis    Cancer of left colon ([place])     06/08/2017 Imaging    CT AP W Contrast 06/08/17 IMPRESSION: 1. Mass of the transverse colon near splenic flexure measuring up to 4 cm compatible with colonic adenocarcinoma. No lymphadenopathy by imaging criteria. 2. Proximal dilatation of ascending and transverse colon with mild inflammatory changes likely due to obstruction. 3. 4 mm right middle lobe pulmonary nodule, attention on follow-up recommended. 4. 2 mm right kidney nonobstructing nephrolithiasis.      06/09/2017 Surgery    LAPAROSCOPY DIAGNOSTIC, OPEN LEFT COLECTOMY by Dr. Marcello Moores      06/09/2017 Pathology Results    Diagnosis 06/09/17 Colon, segmental resection for tumor, left - INVASIVE ADENOCARCINOMA, WELL DIFFERENTIATED, SPANNING 5 CM. - TUMOR INVADES THROUGH MUSCULARIS PROPRIA INTO SUBSEROSAL TISSUE. - RESECTION MARGINS ARE NEGATIVE. - METASTATIC CARCINOMA IN TWO OF TWENTY-SEVEN LYMPH NODES (2/27). - SEE ONCOLOGY TABLE.       07/20/2017 Imaging    CT Chest W Contrast IMPRESSION: No evidence of metastatic disease or other acute findings within the thorax.        HISTORY OF PRESENTING ILLNESS: 07/07/17 James Doyle 71 y.o. male is here because of newly diagnosed cancer of left colon. He  presents to the clinic with his wife, daughter and granddaughter. Referred by Dr. Marcello Moores  He was diagnosed with screening colonoscopy by Dr. Penelope Coop at [place]. He was due for his colonoscopy: Before that He had intermittent sever stomach pain, and reported abdominal bloating and swelling. He also had some constipation. He developed bowel obstruction after the colonoscopy and was admitted to hospital. He underwent left segmental colon resection by Dr. Marcello Moores on 06/09/2017.   today he feels fine since surgery. His incision is healing and stoma is well. His appetite is back to normal overall. Sometimes he will lower his intake when he afriad his bag will get too full  His bowel will vary from loose to formed. It's mostly is pasty. No blood in stool since surgery. he empties 2-3 times a day. He denies fever or chills. His activity is better but he still takes a nap when needed.  He plays music/string instruments. He is not to pick up on anything that could strain his stomach. He has tingling in his right hand fingers slightly since surgery. 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  He is HOH and wears hearing Aid. He is somewhat active and tries to walk. His family wanted to clear up the side of which his tumor was on and that delayed his surgery.   He plays music around town several times a week. wife wants to know will he get as much hep from pill as with IV chemo.  His family shares the questions with his chemo options.   Past,  for his knee replacement he was on blood thinner and he notied blood in stool after surgery. He has not been diagnosed with any other significant issues. He takes tylenol wheen needed. 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 Ear surgery due to ear drum burst at 16. 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 His maternal uncle had colon cancer.  CURRENT THERAPY: Xeloda 2576m in am and 20044mpm, 2 weeks on and 1 week off,  started on 08/05/2017  INTERVAL HISTORY: James Doyle to the clinic for follow up. He is accompanied by his wife and daughter. He completed the first cycle of Xeloda, tolerated well overall. He denies any nausea, noticeable fatigue, or other side effects, except mild skin dryness and itchiness. No rash. No neuropathy. He has good appetite and energy level, continues to play music 3-4 times a week.   MEDICAL HISTORY:  Past Medical History:  Diagnosis Date  . Arthritis   . H/O vocal cord paralysis    left     SURGICAL HISTORY: Past Surgical History:  Procedure Laterality Date  . BACK SURGERY     times 2  . INNER EAR SURGERY     bilat  . LAPAROSCOPY N/A 06/09/2017   Procedure: LAPAROSCOPY DIAGNOSTIC, OPEN  LEFT COLECTOMY;  Surgeon: ThLeighton RuffMD;  Location: [facility];  Service: General;  Laterality: N/A;  . PROSTATE BIOPSY    . TONSILLECTOMY    . TOTAL KNEE ARTHROPLASTY Right 07/27/2016   Procedure: RIGHT TOTAL KNEE ARTHROPLASTY;  Surgeon: FrGaynelle ArabianMD;  Location: [facility];  Service: Orthopedics;  Laterality: Right;    SOCIAL HISTORY: Social History   Social History  . Marital status: Married    Spouse name: N/A  . Number of children: N/A  . Years of education: N/A   Occupational History  . Not on file.   Social History Main Topics  . Smoking status: Never Smoker  . Smokeless tobacco: Never Used  . Alcohol use No  . Drug use: No  . Sexual activity: Not on file   Other Topics Concern  . Not on file   Social History Narrative  . No narrative on file    FAMILY HISTORY: Family History  Problem Relation Age of Onset  . Heart failure Mother   . Stroke Father   . Cancer Maternal Uncle        colon cancer    ALLERGIES:  has No Known Allergies.  MEDICATIONS:  Current Outpatient Prescriptions  Medication Sig Dispense Refill  . capecitabine (XELODA) 500 MG tablet TAKE 5 TABS (2500MG) IN AM & 4 TABS (2000MG) IN PM FOR 14 DAYS, THEN OFF 7 DAYS. 126 tablet 3    . acetaminophen (TYLENOL) 500 MG tablet Take 2 tablets (1,000 mg total) by mouth every 8 (eight) hours as needed. (Patient not taking: Reported on 07/22/2017) 30 tablet 0  . docusate sodium (COLACE) 100 MG capsule Take 200 mg by mouth 2 (two) times daily as needed for mild constipation.    . polyethylene glycol (MIRALAX / GLYCOLAX) packet Take 17 g by mouth daily as needed for mild constipation.     No current facility-administered medications for this visit.     REVIEW OF SYSTEMS:   Constitutional: Denies fevers, chills or abnormal night sweats Eyes: Denies blurriness of vision, double vision or watery eyes Ears, nose, mouth, throat, and face: Denies mucositis or sore throat Respiratory: Denies cough, dyspnea or wheezes Cardiovascular: Denies palpitation, chest discomfort  or lower extremity swelling Gastrointestinal:  Denies nausea, heartburn or change in bowel habits Skin: Denies abnormal skin rashes Lymphatics: Denies new lymphadenopathy or easy bruising Neurological:Denies numbness, tingling or new weaknesses Behavioral/Psych: Mood is stable, no new changes  All other systems were reviewed with the patient and are negative.  PHYSICAL EXAMINATION:  ECOG PERFORMANCE STATUS: 1 - Symptomatic but completely ambulatory BP (!) 155/79 (BP Location: Left Arm, Patient Position: Sitting) Comment: nurse aware  Pulse (!) 58   Temp 97.6 F (36.4 C) (Oral)   Resp 18   Ht 6' 1"  (1.854 m)   Wt 235 lb 8 oz (106.8 kg)   SpO2 100%   BMI 31.07 kg/m  GENERAL:alert, no distress and comfortable SKIN: skin color, texture, turgor are normal, no rashes or significant lesions, except diffuse erythema on his palms, no skin peeling or cracks. EYES: normal, conjunctiva are pink and non-injected, sclera clear OROPHARYNX:no exudate, no erythema and lips, buccal mucosa, and tongue normal  NECK: supple, thyroid normal size, non-tender, without nodularity LYMPH:  no palpable lymphadenopathy in the cervical,  axillary or inguinal LUNGS: clear to auscultation and percussion with normal breathing effort HEART: regular rate & rhythm and no murmurs and no lower extremity edema ABDOMEN: abdomen soft, non-tender and normal bowel sounds, (+) colostomy bag, his midline incision has healed well  Musculoskeletal:no cyanosis of digits and no clubbing  PSYCH: alert & oriented x 3 with fluent speech NEURO: no focal motor/sensory deficits  LABORATORY DATA:  I have reviewed the data as listed CBC Latest Ref Rng & Units 08/18/2017 07/22/2017 06/12/2017  WBC 4.0 - 10.3 10e3/uL 9.2 10.6(H) 14.3(H)  Hemoglobin 13.0 - 17.1 g/dL 11.5(L) 9.8(L) 9.1(L)  Hematocrit 38.4 - 49.9 % 36.1(L) 32.4(L) 29.4(L)  Platelets 140 - 400 10e3/uL 217 176 229    CMP Latest Ref Rng & Units 08/18/2017 07/22/2017 06/12/2017  Glucose 70 - 140 mg/dl 108 99 122(H)  BUN 7.0 - 26.0 mg/dL 16.9 15.1 10  Creatinine 0.7 - 1.3 mg/dL 1.3 1.1 0.98  Sodium 136 - 145 mEq/L 141 140 136  Potassium 3.5 - 5.1 mEq/L 4.4 4.0 4.2  Chloride 101 - 111 mmol/L - - 101  CO2 22 - 29 mEq/L 27 29 29   Calcium 8.4 - 10.4 mg/dL 9.7 9.8 8.4(L)  Total Protein 6.4 - 8.3 g/dL 7.4 7.2 -  Total Bilirubin 0.20 - 1.20 mg/dL 0.79 0.40 -  Alkaline Phos 40 - 150 U/L 87 92 -  AST 5 - 34 U/L 21 16 -  ALT 0 - 55 U/L 12 14 -   CEA (<5.6ng/ml) 06/09/2017: 1.8  07/22/2017: 1.34  PATHOLOGY  Diagnosis 06/09/17 Colon, segmental resection for tumor, left - INVASIVE ADENOCARCINOMA, WELL DIFFERENTIATED, SPANNING 5 CM. - TUMOR INVADES THROUGH MUSCULARIS PROPRIA INTO SUBSEROSAL TISSUE. - RESECTION MARGINS ARE NEGATIVE. - METASTATIC CARCINOMA IN TWO OF TWENTY-SEVEN LYMPH NODES (2/27). - SEE ONCOLOGY TABLE. Microscopic Comment COLON AND RECTUM (INCLUDING TRANS-ANAL RESECTION): Specimen: Left colon. Procedure: Segmental resection. Tumor site: Transverse colon. Specimen integrity: Intact. Macroscopic tumor perforation: Not identified. Invasive tumor: Maximum size: 5 cm Histologic  type(s): Invasive adenocarcinoma. Histologic grade and differentiation: G1: well differentiated/low grade Type of polyp in which invasive carcinoma arose: N/A. Microscopic extension of invasive tumor: Tumor invades through muscularis propria into subserosal tissue. Lymph-Vascular invasion: Definitive invasion not identified. Peri-neural invasion: Not identified. Tumor deposit(s) (discontinuous extramural extension): Not identified. Resection margins: Proximal margin: Negative. Distal margin: Negative. Circumferential (radial) (posterior ascending, posterior descending; lateral and posterior mid-rectum; and entire lower  1/3 rectum): Negative. Mesenteric margin (sigmoid and transverse): Negative. Distance closest margin (if all above margins negative): 2 cm (mesenteric). Treatment effect (neo-adjuvant therapy): N/A. Microscopic Comment(continued) Additional polyp(s): None. Non-neoplastic findings: Diverticulosis. Lymph nodes: number examined 27; number positive: 2 Pathologic Staging: pT3, pN1b, pMX Ancillary studies: MSI and MMR will be ordered.   RADIOGRAPHIC STUDIES: I have personally reviewed the radiological images as listed and agreed with the findings in the report. Ct Chest Wo Contrast  Result Date: 07/20/2017 CLINICAL DATA:  Recently diagnosed left colon carcinoma. Staging workup. EXAM: CT CHEST WITHOUT CONTRAST TECHNIQUE: Multidetector CT imaging of the chest was performed following the standard protocol without IV contrast. COMPARISON:  None. FINDINGS: Cardiovascular: No acute findings. Aortic and coronary artery atherosclerosis. Prior median sternotomy. Mediastinum/Nodes: No masses or pathologically enlarged lymph nodes identified on this unenhanced exam. Lungs/Pleura: No pulmonary infiltrate or mass identified. No suspicious pulmonary nodules identified. No effusion present. Upper Abdomen: Fluid attenuation cysts noted in visualized portions of kidneys. Musculoskeletal:  No  suspicious bone lesions. IMPRESSION: No evidence of metastatic disease or other acute findings within the thorax. Aortic Atherosclerosis (ICD10-I70.0). Electronically Signed   By: Earle Gell M.D.   On: 07/20/2017 16:09    ASSESSMENT & PLAN:  James Doyle is a 71 y.o. male who has a history of arthritis.   1. Left colon cancer, invasive adenocarcinoma, G1, pT3N1bM0, stage IIIB, MSI-stable  -We reveiwed his colon cancer and pathology results. it's stage IIIB with 2 out of 27 positive lymph nodes.  -staging CT abd/pel  Was negative for metastatic disease. -His CT chest was negative for metastasis, I reviewed the results with patient -His tumor was resected completely and he is cancer free now. However, he does  have a high risk of recurrence due to his locally advanced disease.  -we discussed adjuvant chemo is recommend and standard care for stage III colon cancer. So I strongly suggest he consider aduvant chemotherapy to reduce his risk of recurrence.  -We reviewed his chemo options, including single agent Xeloda or 5-FU for 6 months, or more standard option of iv chemo Oxaliplatin and 5-fu Pump (FOLFOX) every 2 weeks for 3 months.  -Patient has decided to take Xeloda alone, he declined intravenous chemotherapy. -He has started adjuvant Xeloda, tolerated first cycle very well, except mild palm erythema. I encouraged him to use lotions  -Lab reviewed, his anemia has had approved, normal WBC and platelet count, CMP within normal limits, adequate for continue treatment, he will start second cycle Xeloda tomorrow. -Followed every 3 weeks.  2 osteoarthritis  -f/u and monitoring    PLAN:  -I refilled Xeloda today, he will start cycle 2 with 2566m am and 20084mpm, for 2 weeks then off for one week -Lab and follow-up in 3 weeks  No orders of the defined types were placed in this encounter.   All questions were answered. The patient knows to call the clinic with any problems, questions or  concerns. I spent 15 minutes counseling the patient face to face. The total time spent in the appointment was 20 minutes and more than 50% was on counseling.      FeTruitt MerleMD 08/18/2017

## 2017-08-18 ENCOUNTER — Other Ambulatory Visit (HOSPITAL_BASED_OUTPATIENT_CLINIC_OR_DEPARTMENT_OTHER): Payer: Medicare Other

## 2017-08-18 ENCOUNTER — Ambulatory Visit (HOSPITAL_BASED_OUTPATIENT_CLINIC_OR_DEPARTMENT_OTHER): Payer: Medicare Other | Admitting: Hematology

## 2017-08-18 ENCOUNTER — Telehealth: Payer: Self-pay | Admitting: Hematology

## 2017-08-18 ENCOUNTER — Encounter: Payer: Self-pay | Admitting: Hematology

## 2017-08-18 VITALS — BP 155/79 | HR 58 | Temp 97.6°F | Resp 18 | Ht 73.0 in | Wt 235.5 lb

## 2017-08-18 DIAGNOSIS — C779 Secondary and unspecified malignant neoplasm of lymph node, unspecified: Secondary | ICD-10-CM

## 2017-08-18 DIAGNOSIS — C186 Malignant neoplasm of descending colon: Secondary | ICD-10-CM

## 2017-08-18 DIAGNOSIS — M1990 Unspecified osteoarthritis, unspecified site: Secondary | ICD-10-CM | POA: Diagnosis not present

## 2017-08-18 LAB — CBC WITH DIFFERENTIAL/PLATELET
BASO%: 0.5 % (ref 0.0–2.0)
Basophils Absolute: 0 10*3/uL (ref 0.0–0.1)
EOS ABS: 0.3 10*3/uL (ref 0.0–0.5)
EOS%: 3.1 % (ref 0.0–7.0)
HCT: 36.1 % — ABNORMAL LOW (ref 38.4–49.9)
HEMOGLOBIN: 11.5 g/dL — AB (ref 13.0–17.1)
LYMPH#: 1.7 10*3/uL (ref 0.9–3.3)
LYMPH%: 18.7 % (ref 14.0–49.0)
MCH: 23.9 pg — ABNORMAL LOW (ref 27.2–33.4)
MCHC: 31.9 g/dL — ABNORMAL LOW (ref 32.0–36.0)
MCV: 75.1 fL — ABNORMAL LOW (ref 79.3–98.0)
MONO#: 0.9 10*3/uL (ref 0.1–0.9)
MONO%: 10 % (ref 0.0–14.0)
NEUT%: 67.7 % (ref 39.0–75.0)
NEUTROS ABS: 6.3 10*3/uL (ref 1.5–6.5)
PLATELETS: 217 10*3/uL (ref 140–400)
RBC: 4.81 10*6/uL (ref 4.20–5.82)
RDW: 16.7 % — AB (ref 11.0–14.6)
WBC: 9.2 10*3/uL (ref 4.0–10.3)

## 2017-08-18 LAB — COMPREHENSIVE METABOLIC PANEL
ALBUMIN: 4.2 g/dL (ref 3.5–5.0)
ALT: 12 U/L (ref 0–55)
ANION GAP: 8 meq/L (ref 3–11)
AST: 21 U/L (ref 5–34)
Alkaline Phosphatase: 87 U/L (ref 40–150)
BILIRUBIN TOTAL: 0.79 mg/dL (ref 0.20–1.20)
BUN: 16.9 mg/dL (ref 7.0–26.0)
CALCIUM: 9.7 mg/dL (ref 8.4–10.4)
CHLORIDE: 106 meq/L (ref 98–109)
CO2: 27 mEq/L (ref 22–29)
CREATININE: 1.3 mg/dL (ref 0.7–1.3)
EGFR: 56 mL/min/{1.73_m2} — ABNORMAL LOW (ref >90)
Glucose: 108 mg/dl (ref 70–140)
Potassium: 4.4 mEq/L (ref 3.5–5.1)
Sodium: 141 mEq/L (ref 136–145)
TOTAL PROTEIN: 7.4 g/dL (ref 6.4–8.3)

## 2017-08-18 MED ORDER — CAPECITABINE 500 MG PO TABS: ORAL_TABLET | ORAL | 3 refills | Status: DC

## 2017-08-18 MED FILL — CAPECITABINE 500 MG TABLET: 500 | 21 days supply | Qty: 126 | Fill #0

## 2017-08-18 NOTE — Telephone Encounter (Signed)
Gave avs and calendar for october °

## 2017-09-07 NOTE — Progress Notes (Addendum)
[place]  [phone] Fax:([phone]) [phone]  Clinic Follow Up Note   Patient Care Team: Alroy Dust, L.Marlou Sa, MD as PCP - General (Family Medicine) Leighton Ruff, MD as Consulting Physician (General Surgery) Truitt Merle, MD as Consulting Physician (Hematology) Wonda Horner, MD as Consulting Physician (Gastroenterology) 09/08/2017   CHIEF COMPLAINTS:  Follow Up for Colon Cancer  Oncology History   Cancer Staging Cancer of left colon [facility]) Staging form: Colon and Rectum, AJCC 8th Edition - Pathologic stage from 06/09/2017: Stage IIIB (pT3, pN1b, cM0) - Signed by Truitt Merle, MD on 07/06/2017       Cancer of left colon ([place])   06/08/2017 Initial Diagnosis    Cancer of left colon ([place])     06/08/2017 Imaging    CT AP W Contrast 06/08/17 IMPRESSION: 1. Mass of the transverse colon near splenic flexure measuring up to 4 cm compatible with colonic adenocarcinoma. No lymphadenopathy by imaging criteria. 2. Proximal dilatation of ascending and transverse colon with mild inflammatory changes likely due to obstruction. 3. 4 mm right middle lobe pulmonary nodule, attention on follow-up recommended. 4. 2 mm right kidney nonobstructing nephrolithiasis.      06/09/2017 Surgery    LAPAROSCOPY DIAGNOSTIC, OPEN LEFT COLECTOMY by Dr. Marcello Moores      06/09/2017 Pathology Results    Diagnosis 06/09/17 Colon, segmental resection for tumor, left - INVASIVE ADENOCARCINOMA, WELL DIFFERENTIATED, SPANNING 5 CM. - TUMOR INVADES THROUGH MUSCULARIS PROPRIA INTO SUBSEROSAL TISSUE. - RESECTION MARGINS ARE NEGATIVE. - METASTATIC CARCINOMA IN TWO OF TWENTY-SEVEN LYMPH NODES (2/27). - SEE ONCOLOGY TABLE.       07/20/2017 Imaging    CT Chest W Contrast IMPRESSION: No evidence of metastatic disease or other acute findings within the thorax.      08/05/2017 -  Chemotherapy    Xeloda 2544m in am and 20015mpm, 2 weeks on and 1 week off, plan for 6 months         HISTORY OF  PRESENTING ILLNESS: 07/07/17 [name]89.o. male is here because of newly diagnosed cancer of left colon. He presents to the clinic with his wife, daughter and granddaughter. Referred by Dr. ThMarcello MooresHe was diagnosed with screening colonoscopy by Dr. GaPenelope Coopt EaPleasant ViewHe was due for his colonoscopy: Before that He had intermittent sever stomach pain, and reported abdominal bloating and swelling. He also had some constipation. He developed bowel obstruction after the colonoscopy and was admitted to hospital. He underwent left segmental colon resection by Dr. ThMarcello Mooresn 06/09/2017.   today he feels fine since surgery. His incision is healing and stoma is well. His appetite is back to normal overall. Sometimes he will lower his intake when he afriad his bag will get too full  His bowel will vary from loose to formed. It's mostly is pasty. No blood in stool since surgery. he empties 2-3 times a day. He denies fever or chills. His activity is better but he still takes a nap when needed.  He plays music/string instruments. He is not to pick up on anything that could strain his stomach. He has tingling in his right hand fingers slightly since surgery. He is now retired and previous work in [employer] a [employer] He is HOH and wears hearing Aid. He is somewhat active and tries to walk. His family wanted to clear up the side of which his tumor was on and that delayed his surgery.   He plays music around town several times  a week. wife wants to know will he get as much hep from pill as with IV chemo.  His family shares the questions with his chemo options.   Past, for his knee replacement he was on blood thinner and he notied blood in stool after surgery. He has not been diagnosed with any other significant issues. He takes tylenol wheen needed. 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 Ear surgery due to ear drum burst at 16. 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 His  maternal uncle had colon cancer.  CURRENT THERAPY: Xeloda 2574m in am and 20058mpm, 2 weeks on and 1 week off, started on 08/05/2017  INTERVAL HISTORY: James SwMallekresents to the clinic for follow up as scheduled prior to beginning cycle 3 Xeloda. He is accompanied by his wife and daughter. He takes 5 tablets in the morning, 4 in the evening, has been off this past week and will be due to begin next cycle tomorrow. He has had diarrhea for 2 weeks which has now started to take form in the last day or so. He did not try Imodium. He had a sore mouth that contributed to decreased po intake and weight loss. He has mild fatigue. He notes lightheadedness on standing or upright position changes. His hands and feet are red and slightly tender, denies numbness or tingling. He applies mild lotion to his hands and wears gloves, this helps some. He has thin skin and some skin darkening on both sides of his face.   MEDICAL HISTORY:  Past Medical History:  Diagnosis Date  . Arthritis   . H/O vocal cord paralysis    left     SURGICAL HISTORY: Past Surgical History:  Procedure Laterality Date  . BACK SURGERY     times 2  . INNER EAR SURGERY     bilat  . LAPAROSCOPY N/A 06/09/2017   Procedure: LAPAROSCOPY DIAGNOSTIC, OPEN  LEFT COLECTOMY;  Surgeon: ThLeighton RuffMD;  Location: [facility];  Service: General;  Laterality: N/A;  . PROSTATE BIOPSY    . TONSILLECTOMY    . TOTAL KNEE ARTHROPLASTY Right 07/27/2016   Procedure: RIGHT TOTAL KNEE ARTHROPLASTY;  Surgeon: FrGaynelle ArabianMD;  Location: [facility];  Service: Orthopedics;  Laterality: Right;    SOCIAL HISTORY: Social History   Social History  . Marital status: Married    Spouse name: N/A  . Number of children: N/A  . Years of education: N/A   Occupational History  . Not on file.   Social History Main Topics  . Smoking status: Never Smoker  . Smokeless tobacco: Never Used  . Alcohol use No  . Drug use: No  . Sexual activity: Not on file    Other Topics Concern  . Not on file   Social History Narrative  . No narrative on file    FAMILY HISTORY: Family History  Problem Relation Age of Onset  . Heart failure Mother   . Stroke Father   . Cancer Maternal Uncle        colon cancer    ALLERGIES:  has No Known Allergies.  MEDICATIONS:  Current Outpatient Prescriptions  Medication Sig Dispense Refill  . acetaminophen (TYLENOL) 500 MG tablet Take 2 tablets (1,000 mg total) by mouth every 8 (eight) hours as needed. 30 tablet 0  . docusate sodium (COLACE) 100 MG capsule Take 200 mg by mouth 2 (two) times daily as needed for mild constipation.    . capecitabine (  XELODA) 500 MG tablet TAKE 5 TABS (2500MG) IN AM & 4 TABS (2000MG) IN PM FOR 14 DAYS, THEN OFF 7 DAYS. (Patient not taking: Reported on 09/08/2017) 126 tablet 3  . ondansetron (ZOFRAN) 8 MG tablet Take 1 tablet (8 mg total) by mouth every 8 (eight) hours as needed for nausea or vomiting. 20 tablet 1  . polyethylene glycol (MIRALAX / GLYCOLAX) packet Take 17 g by mouth daily as needed for mild constipation.    . potassium chloride SA (K-DUR,KLOR-CON) 20 MEQ tablet Take 1 tablet (20 mEq total) by mouth 2 (two) times daily. Take 1 tablet twice daily for 3 days, then once daily until next visit 10 tablet 1  . urea (CARMOL) 10 % cream Apply topically as needed. 71 g 0   No current facility-administered medications for this visit.     REVIEW OF SYSTEMS:  Constitutional: Denies fevers, chills or abnormal night sweats (+) fatigue (+) decreased appetite (+) weight loss, 12 pounds in 3 weeks Eyes: Denies blurriness of vision, double vision or watery eyes Ears, nose, mouth, throat, and face: Denies  sore throat (+) sore mouth, uses biotine Respiratory: Denies cough, dyspnea or wheezes Cardiovascular: Denies palpitation, chest discomfort or lower extremity swelling Gastrointestinal:  Denies heartburn or change in bowel habits (+) mild intermittent nausea (+) watery stool from  her colostomy, present for 2 weeks Skin: Denies abnormal skin rashes (+) thin skin (+) darkening to face (+) hand and foot redness Lymphatics: Denies new lymphadenopathy or easy bruising Neurological:Denies numbness, tingling or new weaknesses Behavioral/Psych: Mood is stable, no new changes  All other systems were reviewed with the patient and are negative.  PHYSICAL EXAMINATION:  ECOG PERFORMANCE STATUS: 2 - Symptomatic, <50% confined to bed BP (!) 90/52 (BP Location: Left Arm, Patient Position: Standing)   Pulse 93   Resp 18   Ht 6' 1"  (1.854 m)   Wt 223 lb 9.6 oz (101.4 kg)   SpO2 100%   BMI 29.50 kg/m    GENERAL:alert, no distress and comfortable SKIN: skin color, texture, turgor are normal, no rashes or significant lesions, except diffuse erythema on his palms, no skin peeling or cracks. EYES: normal, conjunctiva are pink and non-injected, sclera clear OROPHARYNX:no exudate, no erythema and lips, buccal mucosa, and tongue normal. No ulcerations or thrush NECK: supple, thyroid normal size, non-tender, without nodularity LYMPH:  no palpable cervical or supraclavicular lymphadenopathy  LUNGS: clear to auscultation bilaterally with normal breathing effort HEART: regular rate & rhythm and no murmurs and no lower extremity edema ABDOMEN: abdomen soft, non-tender and normal bowel sounds. colostomy bag, his midline incision has healed well  Musculoskeletal:no cyanosis of digits and no clubbing.  Mild non-tender palmar-plantar erythrodysesthesia PSYCH: alert & oriented x 3 with fluent speech NEURO: no focal motor/sensory deficits  LABORATORY DATA:  I have reviewed the data as listed CBC Latest Ref Rng & Units 09/08/2017 08/18/2017 07/22/2017  WBC 4.0 - 10.3 10e3/uL 13.3(H) 9.2 10.6(H)  Hemoglobin 13.0 - 17.1 g/dL 13.4 11.5(L) 9.8(L)  Hematocrit 38.4 - 49.9 % 42.1 36.1(L) 32.4(L)  Platelets 140 - 400 10e3/uL 323 217 176    CMP Latest Ref Rng & Units 09/08/2017 08/18/2017 07/22/2017   Glucose 70 - 140 mg/dl 120 108 99  BUN 7.0 - 26.0 mg/dL 33.3(H) 16.9 15.1  Creatinine 0.7 - 1.3 mg/dL 1.7(H) 1.3 1.1  Sodium 136 - 145 mEq/L 135(L) 141 140  Potassium 3.5 - 5.1 mEq/L 3.0(LL) 4.4 4.0  Chloride 101 - 111 mmol/L - - -  CO2 22 - 29 mEq/L 23 27 29   Calcium 8.4 - 10.4 mg/dL 9.6 9.7 9.8  Total Protein 6.4 - 8.3 g/dL 6.9 7.4 7.2  Total Bilirubin 0.20 - 1.20 mg/dL 1.30(H) 0.79 0.40  Alkaline Phos 40 - 150 U/L 76 87 92  AST 5 - 34 U/L 29 21 16   ALT 0 - 55 U/L 33 12 14   CEA (<5.6ng/ml) 06/09/2017: 1.8  07/22/2017: 1.34  PATHOLOGY  Diagnosis 06/09/17 Colon, segmental resection for tumor, left - INVASIVE ADENOCARCINOMA, WELL DIFFERENTIATED, SPANNING 5 CM. - TUMOR INVADES THROUGH MUSCULARIS PROPRIA INTO SUBSEROSAL TISSUE. - RESECTION MARGINS ARE NEGATIVE. - METASTATIC CARCINOMA IN TWO OF TWENTY-SEVEN LYMPH NODES (2/27). - SEE ONCOLOGY TABLE. Microscopic Comment COLON AND RECTUM (INCLUDING TRANS-ANAL RESECTION): Specimen: Left colon. Procedure: Segmental resection. Tumor site: Transverse colon. Specimen integrity: Intact. Macroscopic tumor perforation: Not identified. Invasive tumor: Maximum size: 5 cm Histologic type(s): Invasive adenocarcinoma. Histologic grade and differentiation: G1: well differentiated/low grade Type of polyp in which invasive carcinoma arose: N/A. Microscopic extension of invasive tumor: Tumor invades through muscularis propria into subserosal tissue. Lymph-Vascular invasion: Definitive invasion not identified. Peri-neural invasion: Not identified. Tumor deposit(s) (discontinuous extramural extension): Not identified. Resection margins: Proximal margin: Negative. Distal margin: Negative. Circumferential (radial) (posterior ascending, posterior descending; lateral and posterior mid-rectum; and entire lower 1/3 rectum): Negative. Mesenteric margin (sigmoid and transverse): Negative. Distance closest margin (if all above margins negative): 2 cm  (mesenteric). Treatment effect (neo-adjuvant therapy): N/A. Microscopic Comment(continued) Additional polyp(s): None. Non-neoplastic findings: Diverticulosis. Lymph nodes: number examined 27; number positive: 2 Pathologic Staging: pT3, pN1b, pMX Ancillary studies: MSI and MMR will be ordered.   RADIOGRAPHIC STUDIES: I have personally reviewed the radiological images as listed and agreed with the findings in the report. No results found.  ASSESSMENT & PLAN:  AWAB ABEBE is a 71 y.o. male who has a history of arthritis.   1. Left colon cancer, invasive adenocarcinoma, G1, pT3N1bM0, stage IIIB, MSI-stable  -Adjuvant chemo was recommended due to his high risk disease. Patient has decided to take Xeloda alone, he declined intravenous chemotherapy. -He began adjuvant Xeloda 08/05/17, tolerated first cycle very well, except mild palm erythema.  -After cycle 2 he is fatigued with mild palmar-plantar erythema and moderate watery stool; he is down 12 pounds with decreased po intake; labs indicate hypokalemia K 3.0, elevated creatinine 1.7 and hyperbilirubinemia 1.3. CBC is stable, Hgb WNL at 13.4.  -for palmar-plantar erythema, I recommended over the counter hydrocortisone cream to hands and feet, I prescribed urea cream for him to fill if this is not helpful -I do not feel he is appropriate to restart xeloda tomorrow; he will hold x1 week and restart at decreased dose -lab and f/u in 1 week with APP; if labs are corrected and he is improved, he will restart at 2000 mg (4 tablets) in AM, 1500 mg (3 tablets) in PM -plan to increase to 4 tablets AM and 4 tablets PM with cycle 4 if he tolerates  2. Nausea, diarrhea -We discussed these side effects are secondary to xeloda -He has intermittent mild nausea, I have prescribed zofran PRN for him to try -for diarrhea, he has not tried imodium yet; he feels his stool is starting to take form -I recommend imodium with first dose at first episode of loose  stool, can take up to 8 per day per package instructions; he will buy today; I will prescribe lomotil if imodium is not enough -I have encouraged him to increase po fluids -  His K is low, 3.0 today, I will add 20 mEq to NS IVF today and have sent prescription for 20 mEq oral K supplement, He will take 2 BID x3 days then 1 daily until next visit in 1 week  3. Dehydration -He has positional lightheadedness and low BP in clinic today -his creatinine is elevated 1.7 today -I have encouraged him to increase po liquid intake, will arrange for 1 L IVF today +20 mEq K over 2 hours  4. Hyperbilirubinemia -bili is elevated to 1.3 today, likely secondary to xeloda. -I will hold for 1 week, give IVF today, and recheck labs in 1 week  5. osteoarthritis  -f/u and monitoring   6. Nutrition, weight loss -he has lost 12 pounds in 3 weeks, related to decreased po intake, nausea, mucositis, and diarrhea secondary to xeloda -I will refer to our dietician for evaluation  7. AKI and hypokalemia  -Cr went from 1.1 on 07/22/17 to 1.7 on 09/08/2017, likely secondary to dehydration from diarrhea and adjuvant chemotherapy -Will give IV fluids with KCL today -oral KCL called in  -I strongly encouraged him to drink more fluids  PLAN:  -IVF today: NS + 20 K over 2 hours -Referral to dietician -prescriptions for zofran, urea cream, oral K supplement -Topical hydrocortisone PRN to hands and feet -hold cycle 3 xeloda x1 week until next visit -lab and f/u in 1 week with APP, if symptoms and labs improved, restart at 4 tablets AM, 3 tablets PM for cycle 3 -lab and f/u with Dr. Burr Medico in 4 weeks   All questions were answered. The patient knows to call the clinic with any problems, questions or concerns.   Cira Rue, AGNP-C 09/08/2017  I have seen the patient, examined him. I agree with the assessment and and plan and have edited the notes.   Pt tolerated the second cycle chemo poorly, will postpone next cycle  for one week, and decrease his xeloda dose, supportive care discussed.   I spent 30 minutes counseling the patient face to face. The total time spent in the appointment was 40 minutes and more than 50% was on counseling.  Truitt Merle 09/08/2017

## 2017-09-08 ENCOUNTER — Encounter: Payer: Self-pay | Admitting: Hematology

## 2017-09-08 ENCOUNTER — Telehealth: Payer: Self-pay | Admitting: Hematology

## 2017-09-08 ENCOUNTER — Ambulatory Visit (HOSPITAL_BASED_OUTPATIENT_CLINIC_OR_DEPARTMENT_OTHER): Payer: Medicare Other | Admitting: Hematology

## 2017-09-08 ENCOUNTER — Ambulatory Visit (HOSPITAL_BASED_OUTPATIENT_CLINIC_OR_DEPARTMENT_OTHER): Payer: Medicare Other

## 2017-09-08 ENCOUNTER — Other Ambulatory Visit (HOSPITAL_BASED_OUTPATIENT_CLINIC_OR_DEPARTMENT_OTHER): Payer: Medicare Other

## 2017-09-08 VITALS — BP 90/52 | HR 93 | Resp 18 | Ht 73.0 in | Wt 223.6 lb

## 2017-09-08 DIAGNOSIS — C186 Malignant neoplasm of descending colon: Secondary | ICD-10-CM

## 2017-09-08 DIAGNOSIS — R5383 Other fatigue: Secondary | ICD-10-CM

## 2017-09-08 DIAGNOSIS — E876 Hypokalemia: Secondary | ICD-10-CM | POA: Diagnosis not present

## 2017-09-08 DIAGNOSIS — E860 Dehydration: Secondary | ICD-10-CM

## 2017-09-08 DIAGNOSIS — N179 Acute kidney failure, unspecified: Secondary | ICD-10-CM

## 2017-09-08 DIAGNOSIS — M170 Bilateral primary osteoarthritis of knee: Secondary | ICD-10-CM

## 2017-09-08 DIAGNOSIS — R197 Diarrhea, unspecified: Secondary | ICD-10-CM | POA: Diagnosis not present

## 2017-09-08 DIAGNOSIS — R634 Abnormal weight loss: Secondary | ICD-10-CM | POA: Diagnosis not present

## 2017-09-08 LAB — CBC WITH DIFFERENTIAL/PLATELET
BASO%: 0.3 % (ref 0.0–2.0)
Basophils Absolute: 0 10*3/uL (ref 0.0–0.1)
EOS%: 1 % (ref 0.0–7.0)
Eosinophils Absolute: 0.1 10*3/uL (ref 0.0–0.5)
HCT: 42.1 % (ref 38.4–49.9)
HEMOGLOBIN: 13.4 g/dL (ref 13.0–17.1)
LYMPH%: 18.6 % (ref 14.0–49.0)
MCH: 24.9 pg — ABNORMAL LOW (ref 27.2–33.4)
MCHC: 31.8 g/dL — ABNORMAL LOW (ref 32.0–36.0)
MCV: 78.2 fL — ABNORMAL LOW (ref 79.3–98.0)
MONO#: 1.5 10*3/uL — ABNORMAL HIGH (ref 0.1–0.9)
MONO%: 11.6 % (ref 0.0–14.0)
NEUT%: 68.5 % (ref 39.0–75.0)
NEUTROS ABS: 9.1 10*3/uL — AB (ref 1.5–6.5)
Platelets: 323 10*3/uL (ref 140–400)
RBC: 5.39 10*6/uL (ref 4.20–5.82)
RDW: 19.2 % — AB (ref 11.0–14.6)
WBC: 13.3 10*3/uL — AB (ref 4.0–10.3)
lymph#: 2.5 10*3/uL (ref 0.9–3.3)

## 2017-09-08 LAB — COMPREHENSIVE METABOLIC PANEL
ALBUMIN: 4 g/dL (ref 3.5–5.0)
ALK PHOS: 76 U/L (ref 40–150)
ALT: 33 U/L (ref 0–55)
AST: 29 U/L (ref 5–34)
Anion Gap: 12 mEq/L — ABNORMAL HIGH (ref 3–11)
BUN: 33.3 mg/dL — AB (ref 7.0–26.0)
CO2: 23 mEq/L (ref 22–29)
Calcium: 9.6 mg/dL (ref 8.4–10.4)
Chloride: 101 mEq/L (ref 98–109)
Creatinine: 1.7 mg/dL — ABNORMAL HIGH (ref 0.7–1.3)
EGFR: 40 mL/min/{1.73_m2} — ABNORMAL LOW (ref >60)
GLUCOSE: 120 mg/dL (ref 70–140)
Potassium: 3 mEq/L — CL (ref 3.5–5.1)
SODIUM: 135 meq/L — AB (ref 136–145)
TOTAL PROTEIN: 6.9 g/dL (ref 6.4–8.3)
Total Bilirubin: 1.3 mg/dL — ABNORMAL HIGH (ref 0.20–1.20)

## 2017-09-08 MED ORDER — POTASSIUM CHLORIDE CRYS ER 20 MEQ PO TBCR: 20.0000 meq | EXTENDED_RELEASE_TABLET | Freq: Two times a day (BID) | ORAL | 1 refills | Status: DC

## 2017-09-08 MED ORDER — UREA 10 % EX CREA: TOPICAL_CREAM | CUTANEOUS | 0 refills | Status: DC | PRN

## 2017-09-08 MED ORDER — ONDANSETRON HCL 8 MG PO TABS: 8.0000 mg | ORAL_TABLET | Freq: Three times a day (TID) | ORAL | 1 refills | Status: DC | PRN

## 2017-09-08 MED ORDER — SODIUM CHLORIDE 0.9 % IV SOLN
Freq: Once | INTRAVENOUS | Status: AC
  Administered 2017-09-08: 16:00:00 via INTRAVENOUS
  Filled 2017-09-08: qty 1000

## 2017-09-08 MED FILL — CAPECITABINE 500 MG TABLET: 500 | 21 days supply | Qty: 126 | Fill #1

## 2017-09-08 NOTE — Telephone Encounter (Signed)
Gave patient avs report and appointments for October and November.  °

## 2017-09-08 NOTE — Patient Instructions (Signed)

## 2017-09-13 ENCOUNTER — Other Ambulatory Visit (HOSPITAL_COMMUNITY)
Admission: AD | Admit: 2017-09-13 | Discharge: 2017-09-13 | Disposition: A | Discharge status: Home / Self Care | Payer: Medicare Other | Source: Ambulatory Visit | Attending: Hematology | Admitting: Hematology

## 2017-09-13 ENCOUNTER — Other Ambulatory Visit: Payer: Self-pay | Admitting: Medical

## 2017-09-13 ENCOUNTER — Other Ambulatory Visit (HOSPITAL_BASED_OUTPATIENT_CLINIC_OR_DEPARTMENT_OTHER): Payer: Medicare Other

## 2017-09-13 ENCOUNTER — Telehealth: Payer: Self-pay

## 2017-09-13 ENCOUNTER — Ambulatory Visit (HOSPITAL_BASED_OUTPATIENT_CLINIC_OR_DEPARTMENT_OTHER): Payer: Medicare Other | Admitting: Medical

## 2017-09-13 VITALS — BP 113/69 | HR 69 | Temp 98.3°F | Resp 19 | Ht 73.0 in | Wt 223.0 lb

## 2017-09-13 DIAGNOSIS — D72823 Leukemoid reaction: Secondary | ICD-10-CM | POA: Diagnosis not present

## 2017-09-13 DIAGNOSIS — C186 Malignant neoplasm of descending colon: Secondary | ICD-10-CM

## 2017-09-13 DIAGNOSIS — R197 Diarrhea, unspecified: Secondary | ICD-10-CM

## 2017-09-13 DIAGNOSIS — L03114 Cellulitis of left upper limb: Secondary | ICD-10-CM | POA: Diagnosis not present

## 2017-09-13 DIAGNOSIS — C779 Secondary and unspecified malignant neoplasm of lymph node, unspecified: Secondary | ICD-10-CM

## 2017-09-13 DIAGNOSIS — E876 Hypokalemia: Secondary | ICD-10-CM | POA: Diagnosis not present

## 2017-09-13 DIAGNOSIS — E871 Hypo-osmolality and hyponatremia: Secondary | ICD-10-CM | POA: Diagnosis not present

## 2017-09-13 LAB — CBC WITH DIFFERENTIAL/PLATELET
BASO%: 0.1 % (ref 0.0–2.0)
BASOS ABS: 0 10*3/uL (ref 0.0–0.1)
EOS ABS: 0.1 10*3/uL (ref 0.0–0.5)
EOS%: 0.3 % (ref 0.0–7.0)
HEMATOCRIT: 41.5 % (ref 38.4–49.9)
HEMOGLOBIN: 14.2 g/dL (ref 13.0–17.1)
LYMPH#: 1.7 10*3/uL (ref 0.9–3.3)
LYMPH%: 7.7 % — ABNORMAL LOW (ref 14.0–49.0)
MCH: 26.7 pg — AB (ref 27.2–33.4)
MCHC: 34.2 g/dL (ref 32.0–36.0)
MCV: 78.2 fL — ABNORMAL LOW (ref 79.3–98.0)
MONO#: 2.5 10*3/uL — ABNORMAL HIGH (ref 0.1–0.9)
MONO%: 11.2 % (ref 0.0–14.0)
NEUT%: 80.7 % — ABNORMAL HIGH (ref 39.0–75.0)
NEUTROS ABS: 17.9 10*3/uL — AB (ref 1.5–6.5)
Platelets: 301 10*3/uL (ref 140–400)
RBC: 5.31 10*6/uL (ref 4.20–5.82)
RDW: 27.3 % — ABNORMAL HIGH (ref 11.0–14.6)
WBC: 22.2 10*3/uL — ABNORMAL HIGH (ref 4.0–10.3)

## 2017-09-13 LAB — COMPREHENSIVE METABOLIC PANEL
ALBUMIN: 3.4 g/dL — AB (ref 3.5–5.0)
ALK PHOS: 79 U/L (ref 40–150)
ALT: 40 U/L (ref 0–55)
AST: 23 U/L (ref 5–34)
Anion Gap: 11 mEq/L (ref 3–11)
BILIRUBIN TOTAL: 1 mg/dL (ref 0.20–1.20)
BUN: 27 mg/dL — AB (ref 7.0–26.0)
CALCIUM: 9.1 mg/dL (ref 8.4–10.4)
CO2: 19 mEq/L — ABNORMAL LOW (ref 22–29)
CREATININE: 1.5 mg/dL — AB (ref 0.7–1.3)
Chloride: 101 mEq/L (ref 98–109)
EGFR: 47 mL/min/{1.73_m2} — ABNORMAL LOW (ref >60)
Glucose: 128 mg/dl (ref 70–140)
POTASSIUM: 3.1 meq/L — AB (ref 3.5–5.1)
Sodium: 131 mEq/L — ABNORMAL LOW (ref 136–145)
TOTAL PROTEIN: 6 g/dL — AB (ref 6.4–8.3)

## 2017-09-13 LAB — TECHNOLOGIST REVIEW

## 2017-09-13 LAB — C DIFFICILE QUICK SCREEN W PCR REFLEX
C DIFFICLE (CDIFF) ANTIGEN: NEGATIVE
C Diff interpretation: NOT DETECTED
C Diff toxin: NEGATIVE

## 2017-09-13 LAB — MAGNESIUM: Magnesium: 2 mg/dl (ref 1.5–2.5)

## 2017-09-13 MED ORDER — POTASSIUM CHLORIDE CRYS ER 20 MEQ PO TBCR: 20.0000 meq | EXTENDED_RELEASE_TABLET | Freq: Once | ORAL | 1 refills | Status: DC

## 2017-09-13 MED ORDER — CEPHALEXIN 250 MG PO CAPS: 250.0000 mg | ORAL_CAPSULE | Freq: Three times a day (TID) | ORAL | 0 refills | Status: DC

## 2017-09-13 MED ORDER — DIPHENOXYLATE-ATROPINE 2.5-0.025 MG PO TABS
ORAL_TABLET | ORAL | Status: AC
  Filled 2017-09-13: qty 1

## 2017-09-13 MED ORDER — SODIUM CHLORIDE 0.9 % IV SOLN
Freq: Once | INTRAVENOUS | Status: AC
  Administered 2017-09-13: 16:00:00 via INTRAVENOUS

## 2017-09-13 MED ORDER — SODIUM CHLORIDE 0.9 % IV SOLN
Freq: Once | INTRAVENOUS | Status: AC
  Administered 2017-09-13: 16:00:00 via INTRAVENOUS
  Filled 2017-09-13: qty 1000

## 2017-09-13 MED ORDER — DIPHENOXYLATE-ATROPINE 2.5-0.025 MG PO TABS: 1.0000 | ORAL_TABLET | Freq: Four times a day (QID) | ORAL | 0 refills | Status: DC | PRN

## 2017-09-13 MED ORDER — POTASSIUM CHLORIDE 20 MEQ/100ML IV SOLN: INTRAVENOUS | 0 refills | Status: DC

## 2017-09-13 MED ORDER — DIPHENOXYLATE-ATROPINE 2.5-0.025 MG PO TABS
1.0000 | ORAL_TABLET | Freq: Once | ORAL | Status: AC
  Administered 2017-09-13: 1 via ORAL

## 2017-09-13 MED FILL — POTASSIUM CL ER 20 MEQ TAB: 20 | 14 days supply | Qty: 14 | Fill #0

## 2017-09-13 MED FILL — CEPHALEXIN 250 MG CAPSULE: 250 | 7 days supply | Qty: 21 | Fill #0

## 2017-09-13 MED FILL — DIPHENOXYLATE/ATROPINE TAB: 2.5-0.025 | 10 days supply | Qty: 40 | Fill #0

## 2017-09-13 NOTE — Telephone Encounter (Signed)
Was able to talk with wife. He is at [facility] with an appt with Lucianne Lei.

## 2017-09-13 NOTE — Patient Instructions (Signed)
Dehydration, Adult Dehydration is a condition in which there is not enough fluid or water in the body. This happens when you lose more fluids than you take in. Important organs, such as the kidneys, brain, and heart, cannot function without a proper amount of fluids. Any loss of fluids from the body can lead to dehydration. Dehydration can range from mild to severe. This condition should be treated right away to prevent it from becoming severe. What are the causes? This condition may be caused by:  Vomiting.  Diarrhea.  Excessive sweating, such as from heat exposure or exercise.  Not drinking enough fluid, especially: ? When ill. ? While doing activity that requires a lot of energy.  Excessive urination.  Fever.  Infection.  Certain medicines, such as medicines that cause the body to lose excess fluid (diuretics).  Inability to access safe drinking water.  Reduced physical ability to get adequate water and food.  What increases the risk? This condition is more likely to develop in people:  Who have a poorly controlled long-term (chronic) illness, such as diabetes, heart disease, or kidney disease.  Who are age 65 or older.  Who are disabled.  Who live in a place with high altitude.  Who play endurance sports.  What are the signs or symptoms? Symptoms of mild dehydration may include:  Thirst.  Dry lips.  Slightly dry mouth.  Dry, warm skin.  Dizziness. Symptoms of moderate dehydration may include:  Very dry mouth.  Muscle cramps.  Dark urine. Urine may be the color of tea.  Decreased urine production.  Decreased tear production.  Heartbeat that is irregular or faster than normal (palpitations).  Headache.  Light-headedness, especially when you stand up from a sitting position.  Fainting (syncope). Symptoms of severe dehydration may include:  Changes in skin, such as: ? Cold and clammy skin. ? Blotchy (mottled) or pale skin. ? Skin that does  not quickly return to normal after being lightly pinched and released (poor skin turgor).  Changes in body fluids, such as: ? Extreme thirst. ? No tear production. ? Inability to sweat when body temperature is high, such as in hot weather. ? Very little urine production.  Changes in vital signs, such as: ? Weak pulse. ? Pulse that is more than 100 beats a minute when sitting still. ? Rapid breathing. ? Low blood pressure.  Other changes, such as: ? Sunken eyes. ? Cold hands and feet. ? Confusion. ? Lack of energy (lethargy). ? Difficulty waking up from sleep. ? Short-term weight loss. ? Unconsciousness. How is this diagnosed? This condition is diagnosed based on your symptoms and a physical exam. Blood and urine tests may be done to help confirm the diagnosis. How is this treated? Treatment for this condition depends on the severity. Mild or moderate dehydration can often be treated at home. Treatment should be started right away. Do not wait until dehydration becomes severe. Severe dehydration is an emergency and it needs to be treated in a hospital. Treatment for mild dehydration may include:  Drinking more fluids.  Replacing salts and minerals in your blood (electrolytes) that you may have lost. Treatment for moderate dehydration may include:  Drinking an oral rehydration solution (ORS). This is a drink that helps you replace fluids and electrolytes (rehydrate). It can be found at pharmacies and retail stores. Treatment for severe dehydration may include:  Receiving fluids through an IV tube.  Receiving an electrolyte solution through a feeding tube that is passed through your nose   and into your stomach (nasogastric tube, or NG tube).  Correcting any abnormalities in electrolytes.  Treating the underlying cause of dehydration. Follow these instructions at home:  If directed by your health care provider, drink an ORS: ? Make an ORS by following instructions on the  package. ? Start by drinking small amounts, about  cup (120 mL) every 5-10 minutes. ? Slowly increase how much you drink until you have taken the amount recommended by your health care provider.  Drink enough clear fluid to keep your urine clear or pale yellow. If you were told to drink an ORS, finish the ORS first, then start slowly drinking other clear fluids. Drink fluids such as: ? Water. Do not drink only water. Doing that can lead to having too little salt (sodium) in the body (hyponatremia). ? Ice chips. ? Fruit juice that you have added water to (diluted fruit juice). ? Low-calorie sports drinks.  Avoid: ? Alcohol. ? Drinks that contain a lot of sugar. These include high-calorie sports drinks, fruit juice that is not diluted, and soda. ? Caffeine. ? Foods that are greasy or contain a lot of fat or sugar.  Take over-the-counter and prescription medicines only as told by your health care provider.  Do not take sodium tablets. This can lead to having too much sodium in the body (hypernatremia).  Eat foods that contain a healthy balance of electrolytes, such as bananas, oranges, potatoes, tomatoes, and spinach.  Keep all follow-up visits as told by your health care provider. This is important. Contact a health care provider if:  You have abdominal pain that: ? Gets worse. ? Stays in one area (localizes).  You have a rash.  You have a stiff neck.  You are more irritable than usual.  You are sleepier or more difficult to wake up than usual.  You feel weak or dizzy.  You feel very thirsty.  You have urinated only a small amount of very dark urine over 6-8 hours. Get help right away if:  You have symptoms of severe dehydration.  You cannot drink fluids without vomiting.  Your symptoms get worse with treatment.  You have a fever.  You have a severe headache.  You have vomiting or diarrhea that: ? Gets worse. ? Does not go away.  You have blood or green matter  (bile) in your vomit.  You have blood in your stool. This may cause stool to look black and tarry.  You have not urinated in 6-8 hours.  You faint.  Your heart rate while sitting still is over 100 beats a minute.  You have trouble breathing. This information is not intended to replace advice given to you by your health care provider. Make sure you discuss any questions you have with your health care provider. Document Released: 11/16/2005 Document Revised: 06/12/2016 Document Reviewed: 01/10/2016 Elsevier Interactive Patient Education  2018 [employer]    Hypokalemia Hypokalemia means that the amount of potassium in the blood is lower than normal.Potassium is a chemical that helps regulate the amount of fluid in the body (electrolyte). It also stimulates muscle tightening (contraction) and helps nerves work properly.Normally, most of the body's potassium is inside of cells, and only a very small amount is in the blood. Because the amount in the blood is so small, minor changes to potassium levels in the blood can be life-threatening. What are the causes? This condition may be caused by:  Antibiotic medicine.  Diarrhea or vomiting. Taking too much of a medicine   that helps you have a bowel movement (laxative) can cause diarrhea and lead to hypokalemia.  Chronic kidney disease (CKD).  Medicines that help the body get rid of excess fluid (diuretics).  Eating disorders, such as bulimia.  Low magnesium levels in the body.  Sweating a lot.  What are the signs or symptoms? Symptoms of this condition include:  Weakness.  Constipation.  Fatigue.  Muscle cramps.  Mental confusion.  Skipped heartbeats or irregular heartbeat (palpitations).  Tingling or numbness.  How is this diagnosed? This condition is diagnosed with a blood test. How is this treated? Hypokalemia can be treated by taking potassium supplements by mouth or adjusting the medicines that you take.  Treatment may also include eating more foods that contain a lot of potassium. If your potassium level is very low, you may need to get potassium through an IV tube in one of your veins and be monitored in the hospital. Follow these instructions at home:  Take over-the-counter and prescription medicines only as told by your health care provider. This includes vitamins and supplements.  Eat a healthy diet. A healthy diet includes fresh fruits and vegetables, whole grains, healthy fats, and lean proteins.  If instructed, eat more foods that contain a lot of potassium, such as: ? Nuts, such as peanuts and pistachios. ? Seeds, such as sunflower seeds and pumpkin seeds. ? Peas, lentils, and lima beans. ? Whole grain and bran cereals and breads. ? Fresh fruits and vegetables, such as apricots, avocado, bananas, cantaloupe, kiwi, oranges, tomatoes, asparagus, and potatoes. ? Orange juice. ? Tomato juice. ? Red meats. ? Yogurt.  Keep all follow-up visits as told by your health care provider. This is important. Contact a health care provider if:  You have weakness that gets worse.  You feel your heart pounding or racing.  You vomit.  You have diarrhea.  You have diabetes (diabetes mellitus) and you have trouble keeping your blood sugar (glucose) in your target range. Get help right away if:  You have chest pain.  You have shortness of breath.  You have vomiting or diarrhea that lasts for more than 2 days.  You faint. This information is not intended to replace advice given to you by your health care provider. Make sure you discuss any questions you have with your health care provider. Document Released: 11/16/2005 Document Revised: 07/04/2016 Document Reviewed: 07/04/2016 Elsevier Interactive Patient Education  2018 [employer] Inc[employer]  

## 2017-09-13 NOTE — Progress Notes (Signed)
[facility] Progress Note   James Doyle [id number] 29-Apr-1946 71 y.o.  James Doyle is managed by Dr. Truitt Doyle  Actively treated with chemotherapy: yes  Current Therapy: Xeloda  Last Treated: Xeloda is currently being held  Assessment: Plan:    Leukemoid reaction - Plan: cephALEXin (KEFLEX) 250 MG capsule  Hypokalemia - Plan: potassium chloride SA (K-DUR,KLOR-CON) 20 MEQ tablet, 0.9 %  sodium chloride infusion, sodium chloride 0.9 % 1,000 mL with potassium chloride 20 mEq infusion, DISCONTINUED: potassium chloride 20 MEQ/100ML IVPB  Diarrhea, unspecified type - Plan: diphenoxylate-atropine (LOMOTIL) 2.5-0.025 MG per tablet 1 tablet, diphenoxylate-atropine (LOMOTIL) 2.5-0.025 MG tablet, 0.9 %  sodium chloride infusion, sodium chloride 0.9 % 1,000 mL with potassium chloride 20 mEq infusion  Cancer of left colon (HCC)  Cellulitis of left upper extremity - Plan: cephALEXin (KEFLEX) 250 MG capsule   Leukemoid reaction with cellulitis of the left upper extremity and lymphadenopathy in the left axilla: The patient was given a prescription for Keflex 250 mg by mouth 3 times a day 7 days.  Hypokalemia: Potassium chloride 20 mEq IV 1,potassium chloride tablets 20 mEq by mouth once daily, #14 with 1 refill  Diarrhea:Lomotil 1 by mouth 4 times a day when necessary diarrhea. Patient was also given 1 L of normal saline IV.  Hyponatremia. The patient was told to add one can of V8 juice to his diet daily.  Colon cancer: The patient has been treated with Xeloda which is being held at this point.  Please see After Visit Summary for patient specific instructions.  Future Appointments Date Time Provider Robinson  10/06/2017 3:00 PM [facility] None  10/06/2017 3:30 PM James Merle, MD [facility] None    No orders of the defined types were placed in this encounter.      Subjective:   Patient ID:  James Doyle is a 71 y.o. (DOB 28-May-1946)  male.  Chief Complaint:  Chief Complaint  Patient presents with  . 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 The patient initially began Xeloda 2500 mg every morning and 2000 mg every afternoon for 2 weeks on and one-week off with plans for 6 months of therapy. He initially started therapy on 08/05/2017. He was last seen by Dr. Burr Doyle on 09/08/2017. His blood pressure that visit was noted to be 90/52. The patient had mild fatigue after his second cycle with mild palmar and plantar erythema and moderately watery stools. He had lost 12 pounds with decreased by mouth intake. His labs returned with a potassium of 3.0, creatinine of 1.7 and hyperbilirubinemia at 1.3. He was told to hold his Xeloda for 1 week and restarted decreased dose of 2000 mg every morning and 1500 mg every afternoon. Plans are increase his dose to 4 tablets every morning and 4 tablets every afternoon with cycle 4. He was having nausea at his last visit and was prescribed Zofran when necessary. He had not yet begun taking Imodium for his diarrhea. She had planned to prescribe Lomotil if he failed Imodium. He was given 20 mEq of potassium chloride at his last visit and was given a prescription for potassium chloride 20 mEq, 2 tablets 3 times a day 3 days then 1 daily until his return in one week. He was having positional lightheadedness. He was encouraged to increase his by mouth  fluids. He was given 1 L of normal saline +20 mEq of potassium at his last visit. He was to have repeat labs in 1 week to reevaluate his kidney function and to reassess his hyperbilirubinemia. This [facility] received a call earlier stating that the patient had passed out on the floor on the night of 10/12. He was instructed to go to the ED. History of present illness this was unable to connect to the patient at home  but left a voicemail on the cell to follow-up. The patient reports that he was asleep on Friday evening and awoke to go to the bathroom. He fell backwards and the bathroom and hit hard on his occipital skull. He did not seek medical attention. On Saturday he fell backwards wall getting to a chair and fell onto his buttocks.He is drinking to boost today, Gatorade, and water. He had been taking around one to 2 Imodium a day but has been taking 4 per day since last Wednesday.He has had to empty his ostomy around 10 times per day. He is intubated at least 5 times per day. He believes that his stool is beginning to have some texture to it with the 4 Imodium per day. He was out working in his yard and scratch to the dorsal surface of his left hand and his left lateral first finger. He denies fevers chills sweats or cough dysuria or difficulty urinating.  Diarrhea:  James Doyle reports diarrhea.  Onset of diarrhea was Since beginning Xeloda.  He patient has an ostomy and is been emptying it and has had watery stools up to 10 times per day. Patient describes diarrhea as watery.  Diarrhea has been associated with nausea, mild anorexia, orthostasis and 2 episodes of syncope..   Patient denies fever, recent antibiotic use, significant abdominal pain.   Previous visits for diarrhea: he reported diarrhea at his last visit of 09/08/2017 and was told to increase his use of Imodium. Evaluation to date: none.  Treatment to date: Imodium and IV fluids  Medications: I have reviewed the patient's current medications.  Allergies: No Known Allergies  Past Medical History:  Diagnosis Date  . Arthritis   . H/O vocal cord paralysis    left     Past Surgical History:  Procedure Laterality Date  . BACK SURGERY     times 2  . INNER EAR SURGERY     bilat  . LAPAROSCOPY N/A 06/09/2017   Procedure: LAPAROSCOPY DIAGNOSTIC, OPEN  LEFT COLECTOMY;  Surgeon: James Ruff, MD;  Location: [facility];  Service: General;   Laterality: N/A;  . PROSTATE BIOPSY    . TONSILLECTOMY    . TOTAL KNEE ARTHROPLASTY Right 07/27/2016   Procedure: RIGHT TOTAL KNEE ARTHROPLASTY;  Surgeon: James Arabian, MD;  Location: [facility];  Service: Orthopedics;  Laterality: Right;    Family History  Problem Relation Age of Onset  . Heart failure Mother   . Stroke Father   . Cancer Maternal Uncle        colon cancer    Social History   Social History  . Marital status: Married    Spouse name: N/A  . Number of children: N/A  . Years of education: N/A   Occupational History  . Not on file.   Social History Main Topics  . Smoking status: Never Smoker  . Smokeless tobacco: Never Used  . Alcohol use No  . Drug use: No  . Sexual activity: Not on file   Other  Topics Concern  . Not on file   Social History Narrative  . No narrative on file    Past Medical History, Surgical history, Social history, and Family history were reviewed and updated as appropriate.   Please see review of systems for further details on the patient's review from today.   Review of Systems:  Review of Systems  Constitutional: Positive for appetite change. Negative for chills, diaphoresis and fever.  Respiratory: Negative for cough, chest tightness and shortness of breath.   Cardiovascular: Negative for chest pain, palpitations and leg swelling.  Gastrointestinal: Positive for diarrhea. Negative for abdominal distention, abdominal pain, blood in stool, constipation, nausea and vomiting.  Genitourinary: Negative for decreased urine volume and difficulty urinating.  Neurological: Positive for dizziness and syncope. Negative for weakness.    Objective:   Physical Exam:  BP 99/62 (BP Location: Right Arm, Patient Position: Sitting)   Pulse 86   Temp 98.3 F (36.8 C) (Oral)   Resp 19   Ht 6\' 1"  (1.854 m)   Wt 223 lb (101.2 kg)   SpO2 100%   BMI 29.42 kg/m  ECOG: 1  Physical Exam  Constitutional: No distress.  HENT:  Head:  Normocephalic and atraumatic.  Mouth/Throat: Oropharynx is clear and moist. No oropharyngeal exudate.  TMs are scarred bilaterally without effusion. The patient's hearing aides were removed for his exam.  Eyes: Right conjunctiva is injected. Left conjunctiva is injected.  Cardiovascular: Normal rate, regular rhythm and normal heart sounds.  Exam reveals no gallop and no friction rub.   No murmur heard. Pulmonary/Chest: Effort normal and breath sounds normal. No respiratory distress. He has no wheezes. He has no rales.  Abdominal: Soft. Bowel sounds are normal. He exhibits no distension. There is no tenderness. There is no rebound and no guarding.    Lymphadenopathy:    He has no cervical adenopathy.    He has axillary adenopathy.       Right axillary: No pectoral and no lateral adenopathy present.       Left axillary: Lateral adenopathy present. No pectoral adenopathy present. Neurological: He is alert.  Skin: Skin is warm and dry. He is not diaphoretic. There is erythema.       Lab Review:     Component Value Date/Time   NA 131 (L) 09/13/2017 1419   K 3.1 (L) 09/13/2017 1419   CL 101 06/12/2017 0520   CO2 19 (L) 09/13/2017 1419   GLUCOSE 128 09/13/2017 1419   BUN 27.0 (H) 09/13/2017 1419   CREATININE 1.5 (H) 09/13/2017 1419   CALCIUM 9.1 09/13/2017 1419   PROT 6.0 (L) 09/13/2017 1419   ALBUMIN 3.4 (L) 09/13/2017 1419   AST 23 09/13/2017 1419   ALT 40 09/13/2017 1419   ALKPHOS 79 09/13/2017 1419   BILITOT 1.00 09/13/2017 1419   GFRNONAA >60 06/12/2017 0520   GFRAA >60 06/12/2017 0520       Component Value Date/Time   WBC 22.2 (H) 09/13/2017 1419   WBC 14.3 (H) 06/12/2017 0520   RBC 5.31 09/13/2017 1419   RBC 3.99 (L) 06/12/2017 0520   HGB 14.2 09/13/2017 1419   HCT 41.5 09/13/2017 1419   PLT 301 09/13/2017 1419   MCV 78.2 (L) 09/13/2017 1419   MCH 26.7 (L) 09/13/2017 1419   MCH 22.8 (L) 06/12/2017 0520   MCHC 34.2 09/13/2017 1419   MCHC 31.0 06/12/2017 0520    RDW 27.3 (H) 09/13/2017 1419   LYMPHSABS 1.7 09/13/2017 1419   MONOABS 2.5 (H)  09/13/2017 1419   EOSABS 0.1 09/13/2017 1419   BASOSABS 0.0 09/13/2017 1419   -------------------------------  Imaging from last 24 hours (if applicable):  Radiology interpretation: No results found.

## 2017-09-13 NOTE — Telephone Encounter (Signed)
Incoming telehealth report that pt passed out on floor the night of 10/12. He was instructed to go to ED. Unable to connect to home phone. LVM on cell to f/u with this report.  Attempting to call back. Home line busy, cell phone lvm, other cell phone not set up for voice mail. 1201 home phone rang 10 times with no answer. 1221 cannot get home phone lvm on cell.

## 2017-09-14 ENCOUNTER — Encounter: Payer: Self-pay | Admitting: Medical

## 2017-09-14 ENCOUNTER — Telehealth: Payer: Self-pay | Admitting: Hematology

## 2017-09-14 NOTE — Telephone Encounter (Signed)
09/13/17 prescription refill scanned in media °

## 2017-09-14 NOTE — Progress Notes (Signed)
Submitted auth request for Diphenoxylate today.

## 2017-09-14 NOTE — Telephone Encounter (Signed)
Patient scheduled 10/31 per schedule message.

## 2017-09-15 ENCOUNTER — Encounter: Payer: Self-pay | Admitting: Nurse Practitioner

## 2017-09-15 ENCOUNTER — Other Ambulatory Visit: Payer: Medicare Other

## 2017-09-15 ENCOUNTER — Ambulatory Visit: Payer: Medicare Other | Admitting: Nurse Practitioner

## 2017-09-15 ENCOUNTER — Other Ambulatory Visit: Payer: Self-pay | Admitting: Nurse Practitioner

## 2017-09-15 ENCOUNTER — Ambulatory Visit (HOSPITAL_BASED_OUTPATIENT_CLINIC_OR_DEPARTMENT_OTHER): Payer: Medicare Other

## 2017-09-15 ENCOUNTER — Ambulatory Visit (HOSPITAL_BASED_OUTPATIENT_CLINIC_OR_DEPARTMENT_OTHER): Payer: Medicare Other | Admitting: Nurse Practitioner

## 2017-09-15 ENCOUNTER — Telehealth: Payer: Self-pay

## 2017-09-15 VITALS — BP 99/69 | HR 69 | Temp 97.6°F | Resp 18 | Ht 73.0 in | Wt 229.0 lb

## 2017-09-15 DIAGNOSIS — C186 Malignant neoplasm of descending colon: Secondary | ICD-10-CM

## 2017-09-15 DIAGNOSIS — R197 Diarrhea, unspecified: Secondary | ICD-10-CM | POA: Diagnosis not present

## 2017-09-15 DIAGNOSIS — L03114 Cellulitis of left upper limb: Secondary | ICD-10-CM | POA: Diagnosis not present

## 2017-09-15 DIAGNOSIS — R110 Nausea: Secondary | ICD-10-CM | POA: Diagnosis not present

## 2017-09-15 DIAGNOSIS — R0609 Other forms of dyspnea: Secondary | ICD-10-CM

## 2017-09-15 LAB — CBC WITH DIFFERENTIAL/PLATELET
BASO%: 0.5 % (ref 0.0–2.0)
Basophils Absolute: 0.1 10*3/uL (ref 0.0–0.1)
EOS ABS: 0.3 10*3/uL (ref 0.0–0.5)
EOS%: 1.7 % (ref 0.0–7.0)
HCT: 39.5 % (ref 38.4–49.9)
HGB: 12.7 g/dL — ABNORMAL LOW (ref 13.0–17.1)
LYMPH%: 13.7 % — AB (ref 14.0–49.0)
MCH: 26.1 pg — ABNORMAL LOW (ref 27.2–33.4)
MCHC: 32.1 g/dL (ref 32.0–36.0)
MCV: 81.3 fL (ref 79.3–98.0)
MONO#: 1.7 10*3/uL — AB (ref 0.1–0.9)
MONO%: 10.7 % (ref 0.0–14.0)
NEUT%: 73.4 % (ref 39.0–75.0)
NEUTROS ABS: 11.6 10*3/uL — AB (ref 1.5–6.5)
PLATELETS: 266 10*3/uL (ref 140–400)
RBC: 4.86 10*6/uL (ref 4.20–5.82)
RDW: 31.1 % — ABNORMAL HIGH (ref 11.0–14.6)
WBC: 15.9 10*3/uL — AB (ref 4.0–10.3)
lymph#: 2.2 10*3/uL (ref 0.9–3.3)

## 2017-09-15 LAB — COMPREHENSIVE METABOLIC PANEL
ALBUMIN: 3 g/dL — AB (ref 3.5–5.0)
ALK PHOS: 78 U/L (ref 40–150)
ALT: 36 U/L (ref 0–55)
ANION GAP: 9 meq/L (ref 3–11)
AST: 25 U/L (ref 5–34)
BILIRUBIN TOTAL: 0.45 mg/dL (ref 0.20–1.20)
BUN: 22.4 mg/dL (ref 7.0–26.0)
CALCIUM: 8.8 mg/dL (ref 8.4–10.4)
CO2: 20 meq/L — AB (ref 22–29)
Chloride: 105 mEq/L (ref 98–109)
Creatinine: 1.2 mg/dL (ref 0.7–1.3)
EGFR: 58 mL/min/{1.73_m2} — AB (ref >60)
Glucose: 97 mg/dl (ref 70–140)
Potassium: 3.4 mEq/L — ABNORMAL LOW (ref 3.5–5.1)
Sodium: 135 mEq/L — ABNORMAL LOW (ref 136–145)
TOTAL PROTEIN: 5.6 g/dL — AB (ref 6.4–8.3)

## 2017-09-15 NOTE — Telephone Encounter (Signed)
Printed avs and calender for upcoming appointments

## 2017-09-15 NOTE — Progress Notes (Signed)
[name]  [phone] [phone]  Clinic Follow up Note   Patient Care Team: Alroy Dust, L.Marlou Sa, MD as PCP - General (Family Medicine) Leighton Ruff, MD as Consulting Physician (General Surgery) Truitt Merle, MD as Consulting Physician (Hematology) Wonda Horner, MD as Consulting Physician (Gastroenterology) 09/15/2017  SUMMARY OF ONCOLOGIC HISTORY: Oncology History   Cancer Staging Cancer of left colon [facility]) Staging form: Colon and Rectum, AJCC 8th Edition - Pathologic stage from 06/09/2017: Stage IIIB (pT3, pN1b, cM0) - Signed by Truitt Merle, MD on 07/06/2017       Cancer of left colon ([place])   06/08/2017 Initial Diagnosis    Cancer of left colon ([place])     06/08/2017 Imaging    CT AP W Contrast 06/08/17 IMPRESSION: 1. Mass of the transverse colon near splenic flexure measuring up to 4 cm compatible with colonic adenocarcinoma. No lymphadenopathy by imaging criteria. 2. Proximal dilatation of ascending and transverse colon with mild inflammatory changes likely due to obstruction. 3. 4 mm right middle lobe pulmonary nodule, attention on follow-up recommended. 4. 2 mm right kidney nonobstructing nephrolithiasis.      06/09/2017 Surgery    LAPAROSCOPY DIAGNOSTIC, OPEN LEFT COLECTOMY by Dr. Marcello Moores      06/09/2017 Pathology Results    Diagnosis 06/09/17 Colon, segmental resection for tumor, left - INVASIVE ADENOCARCINOMA, WELL DIFFERENTIATED, SPANNING 5 CM. - TUMOR INVADES THROUGH MUSCULARIS PROPRIA INTO SUBSEROSAL TISSUE. - RESECTION MARGINS ARE NEGATIVE. - METASTATIC CARCINOMA IN TWO OF TWENTY-SEVEN LYMPH NODES (2/27). - SEE ONCOLOGY TABLE.       07/20/2017 Imaging    CT Chest W Contrast IMPRESSION: No evidence of metastatic disease or other acute findings within the thorax.      08/05/2017 -  Chemotherapy    Xeloda 25101m in am and 20056mpm, 2 weeks on and 1 week off, plan for 6 months       CURRENT THERAPY: Xeloda 250079mn am  and 2000m61m, 2 weeks on and 1 week off, started on 08/05/2017  INTERVAL HISTORY: James Doyle today for follow up as scheduled. He is here with his family. He was last seen on 09/08/17 prior to beginning cycle 3 Xeloda 2500 mg in AM and 2000 mg in PM 2 week on/1 week off, cycle 3 was held at that time due to poor tolerance including diarrhea, dehydration, hypokalemia, and hyperbilirubinemia. He returns today with continued high ostomy output that is mostly watery to loose. He began imodium and lomotil which is helping some. He emptied his bag 8 times in 24 hours. He has intermittent mild nausea, using zofran once per day. He is eating and drinking moderately well. Occasional "dizzy spells" with position changes and mild dyspnea on exertion. He received IVF in symptom management clinic on 09/13/17 and felt better. He was started on keflex for cellulitis of left hand per Van Sandi Mealy-C.  He denies mucositis, fever, chills, numbness or tingling.   REVIEW OF SYSTEMS:   Constitutional: Denies fevers, chills or abnormal weight loss (+) mild fatigue Eyes: Denies blurriness of vision Ears, nose, mouth, throat, and face: Denies mucositis or sore throat Respiratory: Denies cough or wheezes (+) DOE Cardiovascular: Denies palpitation, chest discomfort or lower extremity swelling Gastrointestinal:  Denies vomiting, constipation, heartburn or change in bowel habits (+) mild nausea (+) high ostomy output, 8 episodes watery to loose stool in 24 hours Skin: Denies abnormal skin rashes (+) left hand 1st finger crack at proximal interphalangeal joint, redness and swelling present  Lymphatics: Denies new lymphadenopathy or easy bruising Neurological:Denies numbness, tingling or new weaknesses Behavioral/Psych: Mood is stable, no new changes (+) discouraged about side effects and needing help with BM All other systems were reviewed with the patient and are negative.  MEDICAL HISTORY:  Past Medical History:    Diagnosis Date  . Arthritis   . H/O vocal cord paralysis    left     SURGICAL HISTORY: Past Surgical History:  Procedure Laterality Date  . BACK SURGERY     times 2  . INNER EAR SURGERY     bilat  . LAPAROSCOPY N/A 06/09/2017   Procedure: LAPAROSCOPY DIAGNOSTIC, OPEN  LEFT COLECTOMY;  Surgeon: Leighton Ruff, MD;  Location: [facility];  Service: General;  Laterality: N/A;  . PROSTATE BIOPSY    . TONSILLECTOMY    . TOTAL KNEE ARTHROPLASTY Right 07/27/2016   Procedure: RIGHT TOTAL KNEE ARTHROPLASTY;  Surgeon: Gaynelle Arabian, MD;  Location: [facility];  Service: Orthopedics;  Laterality: Right;    I have reviewed the social history and family history with the patient and they are unchanged from previous note.  ALLERGIES:  has No Known Allergies.  MEDICATIONS:  Current Outpatient Prescriptions  Medication Sig Dispense Refill  . acetaminophen (TYLENOL) 500 MG tablet Take 2 tablets (1,000 mg total) by mouth every 8 (eight) hours as needed. 30 tablet 0  . cephALEXin (KEFLEX) 250 MG capsule Take 1 capsule (250 mg total) by mouth 3 (three) times daily. 21 capsule 0  . diphenoxylate-atropine (LOMOTIL) 2.5-0.025 MG tablet Take 1 tablet by mouth 4 (four) times daily as needed for diarrhea or loose stools. 40 tablet 0  . ondansetron (ZOFRAN) 8 MG tablet Take 1 tablet (8 mg total) by mouth every 8 (eight) hours as needed for nausea or vomiting. 20 tablet 1  . potassium chloride SA (K-DUR,KLOR-CON) 20 MEQ tablet Take 1 tablet (20 mEq total) by mouth once. 14 tablet 1  . capecitabine (XELODA) 500 MG tablet TAKE 5 TABS (2500MG) IN AM & 4 TABS (2000MG) IN PM FOR 14 DAYS, THEN OFF 7 DAYS. (Patient not taking: Reported on 09/08/2017) 126 tablet 3  . docusate sodium (COLACE) 100 MG capsule Take 200 mg by mouth 2 (two) times daily as needed for mild constipation.    . polyethylene glycol (MIRALAX / GLYCOLAX) packet Take 17 g by mouth daily as needed for mild constipation.    . urea (CARMOL) 10 % cream Apply  topically as needed. 71 g 0   No current facility-administered medications for this visit.     PHYSICAL EXAMINATION: ECOG PERFORMANCE STATUS: 2 - Symptomatic, <50% confined to bed  Vitals:   09/15/17 1341  BP: 99/69  Pulse: 69  Resp: 18  Temp: 97.6 F (36.4 C)  SpO2: 100%   Filed Weights   09/15/17 1341  Weight: 229 lb (103.9 kg)    GENERAL:alert, no distress and comfortable SKIN: skin color, texture, turgor are normal, no rashes or significant lesions. Mild palmar erythema, improved from last assessment; cutaneous crack to left first finger PIP with blanching erythema and edema to proximal phalanx. EYES: normal, Conjunctiva are pink and non-injected, sclera clear OROPHARYNX:no exudate, no erythema and lips, buccal mucosa, and tongue normal  NECK: supple, thyroid normal size, non-tender, without nodularity LYMPH:  no palpable cervical, supraclavicular, or axillary lymphadenopathy LUNGS: clear to auscultation bilaterally with normal breathing effort HEART: regular rate & rhythm, S1 and S2 normal, no murmurs and no lower extremity edema ABDOMEN:abdomen soft, non-tender and normal bowel sounds.  Colostomy present. Musculoskeletal:no cyanosis of digits and no clubbing  NEURO: alert & oriented x 3 with fluent speech, no focal motor/sensory deficits PSYCH: tearful at times  LABORATORY DATA:  I have reviewed the data as listed CBC Latest Ref Rng & Units 09/15/2017 09/13/2017 09/08/2017  WBC 4.0 - 10.3 10e3/uL 15.9(H) 22.2(H) 13.3(H)  Hemoglobin 13.0 - 17.1 g/dL 12.7(L) 14.2 13.4  Hematocrit 38.4 - 49.9 % 39.5 41.5 42.1  Platelets 140 - 400 10e3/uL 266 301 323     CMP Latest Ref Rng & Units 09/15/2017 09/13/2017 09/08/2017  Glucose 70 - 140 mg/dl 97 128 120  BUN 7.0 - 26.0 mg/dL 22.4 27.0(H) 33.3(H)  Creatinine 0.7 - 1.3 mg/dL 1.2 1.5(H) 1.7(H)  Sodium 136 - 145 mEq/L 135(L) 131(L) 135(L)  Potassium 3.5 - 5.1 mEq/L 3.4(L) 3.1(L) 3.0(LL)  Chloride 101 - 111 mmol/L - - -  CO2  22 - 29 mEq/L 20(L) 19(L) 23  Calcium 8.4 - 10.4 mg/dL 8.8 9.1 9.6  Total Protein 6.4 - 8.3 g/dL 5.6(L) 6.0(L) 6.9  Total Bilirubin 0.20 - 1.20 mg/dL 0.45 1.00 1.30(H)  Alkaline Phos 40 - 150 U/L 78 79 76  AST 5 - 34 U/L _0 ALT 0 - 55 U/L 36 40 33     RADIOGRAPHIC STUDIES: I have personally reviewed the radiological images as listed and agreed with the findings in the report. No results found.   ASSESSMENT & PLAN: James Doyle is a 71 y.o. male who has a history of arthritis.   1. Left colon cancer, invasive adenocarcinoma, G1, pT3N1bM0, stage IIIB, MSI-stable  2. Nausea, diarrhea 3. Dehydration 4. Hyperbilirubinemia 5. Osteoarthritis 6. Nutrition, weight loss 7. AKI and hypokalemia 8. Left hand cellulitis  Mr. Math shows little improvement from last visit. He does improve temporarily after IVF, but he feels he can increase oral hydration and does not need IVF today. He continues to have high ostomy output that is slightly improved with imodium and lomotil. He will continue oral K. I have encouraged him to use these to maximum doses for optimal benefit. I encouraged him to take zofran 30 minutes before morning and evening meals to optimize anti-nausea effect and maximize meal time nutrition. He drinks 3-4 boost per day and will continue. He will continue keflex for left hand cellulitis; CBC shows leukocytosis secondary to acute infection. Cmet indicate improved creatinine, bili, and K. I recommend hold Xeloda x1 more week due to GI toxicity and active infection. I will arrange IVF 10/19 or 10/20 and return for lab and MD follow up on 10/24.    Plan: -increase oral hydration -continue nutritional supplement -imodium and lomotil for diarrhea -zofran BID 30 minutes before AM and PM meals -complete keflex -continue to hold Xeloda x1 week -IVF Friday or Saturday this week  -return for lab, MD f/u in 1 week   All questions were answered. The patient knows to call the  clinic with any problems, questions or concerns. No barriers to learning was detected. I spent 30 minutes counseling the patient face to face. The total time spent in the appointment was 35 and more than 50% was on counseling and review of test results     Alla Feeling, NP 09/15/17

## 2017-09-16 ENCOUNTER — Encounter: Payer: Self-pay | Admitting: Hematology

## 2017-09-16 NOTE — Progress Notes (Signed)
Pt's Diphenoxylate is approved through 11/29/18.

## 2017-09-17 ENCOUNTER — Ambulatory Visit (HOSPITAL_BASED_OUTPATIENT_CLINIC_OR_DEPARTMENT_OTHER): Payer: Medicare Other

## 2017-09-17 ENCOUNTER — Other Ambulatory Visit: Payer: Self-pay | Admitting: Nurse Practitioner

## 2017-09-17 VITALS — BP 115/61 | HR 66 | Temp 98.0°F | Resp 18

## 2017-09-17 DIAGNOSIS — E860 Dehydration: Secondary | ICD-10-CM | POA: Diagnosis not present

## 2017-09-17 DIAGNOSIS — C186 Malignant neoplasm of descending colon: Secondary | ICD-10-CM | POA: Diagnosis not present

## 2017-09-17 MED ORDER — SODIUM CHLORIDE 0.9 % IV SOLN
Freq: Once | INTRAVENOUS | Status: AC
  Administered 2017-09-17: 15:00:00 via INTRAVENOUS

## 2017-09-17 NOTE — Patient Instructions (Signed)
Dehydration, Adult Dehydration is a condition in which there is not enough fluid or water in the body. This happens when you lose more fluids than you take in. Important organs, such as the kidneys, brain, and heart, cannot function without a proper amount of fluids. Any loss of fluids from the body can lead to dehydration. Dehydration can range from mild to severe. This condition should be treated right away to prevent it from becoming severe. What are the causes? This condition may be caused by:  Vomiting.  Diarrhea.  Excessive sweating, such as from heat exposure or exercise.  Not drinking enough fluid, especially: ? When ill. ? While doing activity that requires a lot of energy.  Excessive urination.  Fever.  Infection.  Certain medicines, such as medicines that cause the body to lose excess fluid (diuretics).  Inability to access safe drinking water.  Reduced physical ability to get adequate water and food.  What increases the risk? This condition is more likely to develop in people:  Who have a poorly controlled long-term (chronic) illness, such as diabetes, heart disease, or kidney disease.  Who are age 65 or older.  Who are disabled.  Who live in a place with high altitude.  Who play endurance sports.  What are the signs or symptoms? Symptoms of mild dehydration may include:  Thirst.  Dry lips.  Slightly dry mouth.  Dry, warm skin.  Dizziness. Symptoms of moderate dehydration may include:  Very dry mouth.  Muscle cramps.  Dark urine. Urine may be the color of tea.  Decreased urine production.  Decreased tear production.  Heartbeat that is irregular or faster than normal (palpitations).  Headache.  Light-headedness, especially when you stand up from a sitting position.  Fainting (syncope). Symptoms of severe dehydration may include:  Changes in skin, such as: ? Cold and clammy skin. ? Blotchy (mottled) or pale skin. ? Skin that does  not quickly return to normal after being lightly pinched and released (poor skin turgor).  Changes in body fluids, such as: ? Extreme thirst. ? No tear production. ? Inability to sweat when body temperature is high, such as in hot weather. ? Very little urine production.  Changes in vital signs, such as: ? Weak pulse. ? Pulse that is more than 100 beats a minute when sitting still. ? Rapid breathing. ? Low blood pressure.  Other changes, such as: ? Sunken eyes. ? Cold hands and feet. ? Confusion. ? Lack of energy (lethargy). ? Difficulty waking up from sleep. ? Short-term weight loss. ? Unconsciousness. How is this diagnosed? This condition is diagnosed based on your symptoms and a physical exam. Blood and urine tests may be done to help confirm the diagnosis. How is this treated? Treatment for this condition depends on the severity. Mild or moderate dehydration can often be treated at home. Treatment should be started right away. Do not wait until dehydration becomes severe. Severe dehydration is an emergency and it needs to be treated in a hospital. Treatment for mild dehydration may include:  Drinking more fluids.  Replacing salts and minerals in your blood (electrolytes) that you may have lost. Treatment for moderate dehydration may include:  Drinking an oral rehydration solution (ORS). This is a drink that helps you replace fluids and electrolytes (rehydrate). It can be found at pharmacies and retail stores. Treatment for severe dehydration may include:  Receiving fluids through an IV tube.  Receiving an electrolyte solution through a feeding tube that is passed through your nose   and into your stomach (nasogastric tube, or NG tube).  Correcting any abnormalities in electrolytes.  Treating the underlying cause of dehydration. Follow these instructions at home:  If directed by your health care provider, drink an ORS: ? Make an ORS by following instructions on the  package. ? Start by drinking small amounts, about  cup (120 mL) every 5-10 minutes. ? Slowly increase how much you drink until you have taken the amount recommended by your health care provider.  Drink enough clear fluid to keep your urine clear or pale yellow. If you were told to drink an ORS, finish the ORS first, then start slowly drinking other clear fluids. Drink fluids such as: ? Water. Do not drink only water. Doing that can lead to having too little salt (sodium) in the body (hyponatremia). ? Ice chips. ? Fruit juice that you have added water to (diluted fruit juice). ? Low-calorie sports drinks.  Avoid: ? Alcohol. ? Drinks that contain a lot of sugar. These include high-calorie sports drinks, fruit juice that is not diluted, and soda. ? Caffeine. ? Foods that are greasy or contain a lot of fat or sugar.  Take over-the-counter and prescription medicines only as told by your health care provider.  Do not take sodium tablets. This can lead to having too much sodium in the body (hypernatremia).  Eat foods that contain a healthy balance of electrolytes, such as bananas, oranges, potatoes, tomatoes, and spinach.  Keep all follow-up visits as told by your health care provider. This is important. Contact a health care provider if:  You have abdominal pain that: ? Gets worse. ? Stays in one area (localizes).  You have a rash.  You have a stiff neck.  You are more irritable than usual.  You are sleepier or more difficult to wake up than usual.  You feel weak or dizzy.  You feel very thirsty.  You have urinated only a small amount of very dark urine over 6-8 hours. Get help right away if:  You have symptoms of severe dehydration.  You cannot drink fluids without vomiting.  Your symptoms get worse with treatment.  You have a fever.  You have a severe headache.  You have vomiting or diarrhea that: ? Gets worse. ? Does not go away.  You have blood or green matter  (bile) in your vomit.  You have blood in your stool. This may cause stool to look black and tarry.  You have not urinated in 6-8 hours.  You faint.  Your heart rate while sitting still is over 100 beats a minute.  You have trouble breathing. This information is not intended to replace advice given to you by your health care provider. Make sure you discuss any questions you have with your health care provider. Document Released: 11/16/2005 Document Revised: 06/12/2016 Document Reviewed: 01/10/2016 [employer] Interactive Patient Education  2018 [employer]  

## 2017-09-22 ENCOUNTER — Other Ambulatory Visit (HOSPITAL_BASED_OUTPATIENT_CLINIC_OR_DEPARTMENT_OTHER): Payer: Medicare Other

## 2017-09-22 ENCOUNTER — Ambulatory Visit (HOSPITAL_BASED_OUTPATIENT_CLINIC_OR_DEPARTMENT_OTHER): Payer: Medicare Other | Admitting: Hematology

## 2017-09-22 VITALS — BP 143/63 | HR 83 | Temp 98.9°F | Resp 19 | Ht 73.0 in | Wt 243.1 lb

## 2017-09-22 DIAGNOSIS — L03114 Cellulitis of left upper limb: Secondary | ICD-10-CM

## 2017-09-22 DIAGNOSIS — R634 Abnormal weight loss: Secondary | ICD-10-CM

## 2017-09-22 DIAGNOSIS — E860 Dehydration: Secondary | ICD-10-CM

## 2017-09-22 DIAGNOSIS — R110 Nausea: Secondary | ICD-10-CM | POA: Diagnosis not present

## 2017-09-22 DIAGNOSIS — C186 Malignant neoplasm of descending colon: Secondary | ICD-10-CM

## 2017-09-22 DIAGNOSIS — E876 Hypokalemia: Secondary | ICD-10-CM

## 2017-09-22 DIAGNOSIS — N179 Acute kidney failure, unspecified: Secondary | ICD-10-CM

## 2017-09-22 LAB — CBC WITH DIFFERENTIAL/PLATELET
BASO%: 0.9 % (ref 0.0–2.0)
BASOS ABS: 0.1 10*3/uL (ref 0.0–0.1)
EOS ABS: 0.4 10*3/uL (ref 0.0–0.5)
EOS%: 4 % (ref 0.0–7.0)
HCT: 33.2 % — ABNORMAL LOW (ref 38.4–49.9)
HGB: 10.8 g/dL — ABNORMAL LOW (ref 13.0–17.1)
LYMPH%: 16.4 % (ref 14.0–49.0)
MCH: 26.5 pg — AB (ref 27.2–33.4)
MCHC: 32.5 g/dL (ref 32.0–36.0)
MCV: 81.5 fL (ref 79.3–98.0)
MONO#: 1 10*3/uL — AB (ref 0.1–0.9)
MONO%: 9.6 % (ref 0.0–14.0)
NEUT#: 7 10*3/uL — ABNORMAL HIGH (ref 1.5–6.5)
NEUT%: 69.1 % (ref 39.0–75.0)
PLATELETS: 195 10*3/uL (ref 140–400)
RBC: 4.08 10*6/uL — AB (ref 4.20–5.82)
RDW: 30.4 % — AB (ref 11.0–14.6)
WBC: 10.1 10*3/uL (ref 4.0–10.3)
lymph#: 1.6 10*3/uL (ref 0.9–3.3)

## 2017-09-22 LAB — COMPREHENSIVE METABOLIC PANEL
ALT: 41 U/L (ref 0–55)
ANION GAP: 11 meq/L (ref 3–11)
AST: 31 U/L (ref 5–34)
Albumin: 2.8 g/dL — ABNORMAL LOW (ref 3.5–5.0)
Alkaline Phosphatase: 89 U/L (ref 40–150)
BILIRUBIN TOTAL: 0.39 mg/dL (ref 0.20–1.20)
BUN: 12 mg/dL (ref 7.0–26.0)
CO2: 23 meq/L (ref 22–29)
Calcium: 8.8 mg/dL (ref 8.4–10.4)
Chloride: 107 mEq/L (ref 98–109)
Creatinine: 1.1 mg/dL (ref 0.7–1.3)
GLUCOSE: 174 mg/dL — AB (ref 70–140)
POTASSIUM: 3.6 meq/L (ref 3.5–5.1)
SODIUM: 140 meq/L (ref 136–145)
TOTAL PROTEIN: 5.5 g/dL — AB (ref 6.4–8.3)

## 2017-09-22 NOTE — Progress Notes (Signed)
[place]  Telephone:([phone]) [phone] Fax:(336) [phone]  Clinic Follow Up Note   Patient Care Team: Alroy Dust, L.Marlou Sa, MD as PCP - General (Family Medicine) Leighton Ruff, MD as Consulting Physician (General Surgery) Truitt Merle, MD as Consulting Physician (Hematology) Wonda Horner, MD as Consulting Physician (Gastroenterology) 09/22/2017   CHIEF COMPLAINTS:  Follow Up for Colon Cancer  Oncology History   Cancer Staging Cancer of left colon [facility]) Staging form: Colon and Rectum, AJCC 8th Edition - Pathologic stage from 06/09/2017: Stage IIIB (pT3, pN1b, cM0) - Signed by Truitt Merle, MD on 07/06/2017       Cancer of left colon ([place])   06/08/2017 Initial Diagnosis    Cancer of left colon ([place])     06/08/2017 Imaging    CT AP W Contrast 06/08/17 IMPRESSION: 1. Mass of the transverse colon near splenic flexure measuring up to 4 cm compatible with colonic adenocarcinoma. No lymphadenopathy by imaging criteria. 2. Proximal dilatation of ascending and transverse colon with mild inflammatory changes likely due to obstruction. 3. 4 mm right middle lobe pulmonary nodule, attention on follow-up recommended. 4. 2 mm right kidney nonobstructing nephrolithiasis.      06/09/2017 Surgery    LAPAROSCOPY DIAGNOSTIC, OPEN LEFT COLECTOMY by Dr. Marcello Moores      06/09/2017 Pathology Results    Diagnosis 06/09/17 Colon, segmental resection for tumor, left - INVASIVE ADENOCARCINOMA, WELL DIFFERENTIATED, SPANNING 5 CM. - TUMOR INVADES THROUGH MUSCULARIS PROPRIA INTO SUBSEROSAL TISSUE. - RESECTION MARGINS ARE NEGATIVE. - METASTATIC CARCINOMA IN TWO OF TWENTY-SEVEN LYMPH NODES (2/27). - SEE ONCOLOGY TABLE.       07/20/2017 Imaging    CT Chest W Contrast IMPRESSION: No evidence of metastatic disease or other acute findings within the thorax.      08/05/2017 -  Chemotherapy    Xeloda 2537m in am and 20057mpm, 2 weeks on and 1 week off, plan for 6 months         HISTORY OF  PRESENTING ILLNESS: 07/07/17 James Chroman89.o. male is here because of newly diagnosed cancer of left colon. He presents to the clinic with his wife, daughter and granddaughter. Referred by Dr. ThMarcello MooresHe was diagnosed with screening colonoscopy by Dr. GaPenelope Coopt EaBethel ManorHe was due for his colonoscopy: Before that He had intermittent sever stomach pain, and reported abdominal bloating and swelling. He also had some constipation. He developed bowel obstruction after the colonoscopy and was admitted to hospital. He underwent left segmental colon resection by Dr. ThMarcello Mooresn 06/09/2017.   today he feels fine since surgery. His incision is healing and stoma is well. His appetite is back to normal overall. Sometimes he will lower his intake when he afriad his bag will get too full  His bowel will vary from loose to formed. It's mostly is pasty. No blood in stool since surgery. he empties 2-3 times a day. He denies fever or chills. His activity is better but he still takes a nap when needed.  He plays music/string instruments. He is not to pick up on anything that could strain his stomach. He has tingling in his right hand fingers slightly since surgery. He is now retired and previous work in [employer] a [employer] He is HOH and wears hearing Aid. He is somewhat active and tries to walk. His family wanted to clear up the side of which his tumor was on and that delayed his surgery.   He plays music around town several times  a week. wife wants to know will he get as much hep from pill as with IV chemo.  His family shares the questions with his chemo options.   Past, for his knee replacement he was on blood thinner and he notied blood in stool after surgery. He has not been diagnosed with any other significant issues. He takes tylenol wheen needed. 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 Ear surgery due to ear drum burst at 16. 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 His  maternal uncle had colon cancer.  CURRENT THERAPY: Xeloda 2557m in am and 20070mpm, 2 weeks on and 1 week off, started on 08/05/2017, reduced to 2000 mg (4 tablets) in AM, 1500 mg (3 tablets) in PM with cycle 3, cycle 3 has been held since 08/1017 due to GI toxicity and active infection.   INTERVAL HISTORY:  Mr SwRolfsonresents to the clinic for follow up. He presented to the clinic today with is wife. He reports he fell a week after his last visit pretty hard. He fell again the following day. He had an appointment with symptom management clinic for diarrhea and has now resolved. He has emptied his colostomy 3 times today with pasty output.  He knows his fingers are swollen and he completed his antibiotic for his cellulitis on left hand. He still uses urea cream. He denies any fever or another skin issues. He denies swelling in his lower extremity.  He notes his vision has slightly changed and his eyes will feel watery. This has improved.  He still eats well. He was told to drink V8 juice. He drinks boost 2-3 times a day.  He has been on potassium bills.   His last chemo cycle was 3 weeks ago.     MEDICAL HISTORY:  Past Medical History:  Diagnosis Date  . Arthritis   . H/O vocal cord paralysis    left     SURGICAL HISTORY: Past Surgical History:  Procedure Laterality Date  . BACK SURGERY     times 2  . INNER EAR SURGERY     bilat  . LAPAROSCOPY N/A 06/09/2017   Procedure: LAPAROSCOPY DIAGNOSTIC, OPEN  LEFT COLECTOMY;  Surgeon: ThLeighton RuffMD;  Location: [facility];  Service: General;  Laterality: N/A;  . PROSTATE BIOPSY    . TONSILLECTOMY    . TOTAL KNEE ARTHROPLASTY Right 07/27/2016   Procedure: RIGHT TOTAL KNEE ARTHROPLASTY;  Surgeon: FrGaynelle ArabianMD;  Location: [facility];  Service: Orthopedics;  Laterality: Right;    SOCIAL HISTORY: Social History   Social History  . Marital status: Married    Spouse name: N/A  . Number of children: N/A  . Years of education: N/A    Occupational History  . Not on file.   Social History Main Topics  . Smoking status: Never Smoker  . Smokeless tobacco: Never Used  . Alcohol use No  . Drug use: No  . Sexual activity: Not on file   Other Topics Concern  . Not on file   Social History Narrative  . No narrative on file    FAMILY HISTORY: Family History  Problem Relation Age of Onset  . Heart failure Mother   . Stroke Father   . Cancer Maternal Uncle        colon cancer    ALLERGIES:  has No Known Allergies.  MEDICATIONS:  Current Outpatient Prescriptions  Medication Sig Dispense Refill  . acetaminophen (TYLENOL) 500 MG tablet  Take 2 tablets (1,000 mg total) by mouth every 8 (eight) hours as needed. 30 tablet 0  . capecitabine (XELODA) 500 MG tablet TAKE 5 TABS (2500MG) IN AM & 4 TABS (2000MG) IN PM FOR 14 DAYS, THEN OFF 7 DAYS. 126 tablet 3  . ondansetron (ZOFRAN) 8 MG tablet Take 1 tablet (8 mg total) by mouth every 8 (eight) hours as needed for nausea or vomiting. 20 tablet 1  . urea (CARMOL) 10 % cream Apply topically as needed. 71 g 0  . diphenoxylate-atropine (LOMOTIL) 2.5-0.025 MG tablet Take 1 tablet by mouth 4 (four) times daily as needed for diarrhea or loose stools. (Patient not taking: Reported on 09/22/2017) 40 tablet 0  . docusate sodium (COLACE) 100 MG capsule Take 200 mg by mouth 2 (two) times daily as needed for mild constipation.    . polyethylene glycol (MIRALAX / GLYCOLAX) packet Take 17 g by mouth daily as needed for mild constipation.    . potassium chloride SA (K-DUR,KLOR-CON) 20 MEQ tablet Take 1 tablet (20 mEq total) by mouth once. 14 tablet 1   No current facility-administered medications for this visit.     REVIEW OF SYSTEMS:   Constitutional: Denies fevers, chills or abnormal night sweats Eyes: Denies blurriness of vision, double vision or watery eyes Ears, nose, mouth, throat, and face: Denies mucositis or sore throat Respiratory: Denies cough, dyspnea or  wheezes Cardiovascular: Denies palpitation, chest discomfort or lower extremity swelling (+) swelling in hands Gastrointestinal:  Denies nausea, heartburn or change in bowel habits  Skin: Denies abnormal skin rashes (+) cellulitis on left hand, improved.  Lymphatics: Denies new lymphadenopathy or easy bruising Neurological:Denies numbness, tingling or new weaknesses Behavioral/Psych: Mood is stable, no new changes  All other systems were reviewed with the patient and are negative.  PHYSICAL EXAMINATION:  ECOG PERFORMANCE STATUS: 1 - Symptomatic but completely ambulatory BP (!) 143/63 (BP Location: Left Arm, Patient Position: Sitting)   Pulse 83   Temp 98.9 F (37.2 C) (Oral)   Resp 19   Ht 6' 1"  (1.854 m)   Wt 243 lb 1.6 oz (110.3 kg)   SpO2 100%   BMI 32.07 kg/m    GENERAL:alert, no distress and comfortable SKIN: skin color, texture, turgor are normal, no rashes or significant lesions, (+) diffuse erythema on his left palm, no skin peeling or cracks, improved EYES: normal, conjunctiva are pink and non-injected, sclera clear OROPHARYNX:no exudate, no erythema and lips, buccal mucosa, and tongue normal  NECK: supple, thyroid normal size, non-tender, without nodularity LYMPH:  no palpable lymphadenopathy in the cervical, axillary or inguinal LUNGS: clear to auscultation and percussion with normal breathing effort HEART: regular rate & rhythm and no murmurs and no lower extremity edema ABDOMEN: abdomen soft, non-tender and normal bowel sounds, (+) colostomy bag, his midline incision has healed well  Musculoskeletal:no cyanosis of digits and no clubbing  PSYCH: alert & oriented x 3 with fluent speech NEURO: no focal motor/sensory deficits  LABORATORY DATA:  I have reviewed the data as listed CBC Latest Ref Rng & Units 09/22/2017 09/15/2017 09/13/2017  WBC 4.0 - 10.3 10e3/uL 10.1 15.9(H) 22.2(H)  Hemoglobin 13.0 - 17.1 g/dL 10.8(L) 12.7(L) 14.2  Hematocrit 38.4 - 49.9 % 33.2(L)  39.5 41.5  Platelets 140 - 400 10e3/uL 195 266 301    CMP Latest Ref Rng & Units 09/22/2017 09/15/2017 09/13/2017  Glucose 70 - 140 mg/dl 174(H) 97 128  BUN 7.0 - 26.0 mg/dL 12.0 22.4 27.0(H)  Creatinine 0.7 - 1.3  mg/dL 1.1 1.2 1.5(H)  Sodium 136 - 145 mEq/L 140 135(L) 131(L)  Potassium 3.5 - 5.1 mEq/L 3.6 3.4(L) 3.1(L)  Chloride 101 - 111 mmol/L - - -  CO2 22 - 29 mEq/L 23 20(L) 19(L)  Calcium 8.4 - 10.4 mg/dL 8.8 8.8 9.1  Total Protein 6.4 - 8.3 g/dL 5.5(L) 5.6(L) 6.0(L)  Total Bilirubin 0.20 - 1.20 mg/dL 0.39 0.45 1.00  Alkaline Phos 40 - 150 U/L 89 78 79  AST 5 - 34 U/L 31 25 23   ALT 0 - 55 U/L 41 36 40   CEA (<5.6ng/ml) 06/09/2017: 1.8  07/22/2017: 1.34  PATHOLOGY  Diagnosis 06/09/17 Colon, segmental resection for tumor, left - INVASIVE ADENOCARCINOMA, WELL DIFFERENTIATED, SPANNING 5 CM. - TUMOR INVADES THROUGH MUSCULARIS PROPRIA INTO SUBSEROSAL TISSUE. - RESECTION MARGINS ARE NEGATIVE. - METASTATIC CARCINOMA IN TWO OF TWENTY-SEVEN LYMPH NODES (2/27). - SEE ONCOLOGY TABLE. Microscopic Comment COLON AND RECTUM (INCLUDING TRANS-ANAL RESECTION): Specimen: Left colon. Procedure: Segmental resection. Tumor site: Transverse colon. Specimen integrity: Intact. Macroscopic tumor perforation: Not identified. Invasive tumor: Maximum size: 5 cm Histologic type(s): Invasive adenocarcinoma. Histologic grade and differentiation: G1: well differentiated/low grade Type of polyp in which invasive carcinoma arose: N/A. Microscopic extension of invasive tumor: Tumor invades through muscularis propria into subserosal tissue. Lymph-Vascular invasion: Definitive invasion not identified. Peri-neural invasion: Not identified. Tumor deposit(s) (discontinuous extramural extension): Not identified. Resection margins: Proximal margin: Negative. Distal margin: Negative. Circumferential (radial) (posterior ascending, posterior descending; lateral and posterior mid-rectum; and entire lower 1/3  rectum): Negative. Mesenteric margin (sigmoid and transverse): Negative. Distance closest margin (if all above margins negative): 2 cm (mesenteric). Treatment effect (neo-adjuvant therapy): N/A. Microscopic Comment(continued) Additional polyp(s): None. Non-neoplastic findings: Diverticulosis. Lymph nodes: number examined 27; number positive: 2 Pathologic Staging: pT3, pN1b, pMX Ancillary studies: MSI and MMR will be ordered.   RADIOGRAPHIC STUDIES: I have personally reviewed the radiological images as listed and agreed with the findings in the report. No results found.  ASSESSMENT & PLAN:  CLEOTIS SPARR is a 71 y.o. male who has a history of arthritis.   1. Left colon cancer, invasive adenocarcinoma, G1, pT3N1bM0, stage IIIB, MSI-stable  -We reveiwed his colon cancer and pathology results. it's stage IIIB with 2 out of 27 positive lymph nodes.  -staging CT abd/pel  Was negative for metastatic disease. -His CT chest was negative for metastasis, I reviewed the results with patient -His tumor was resected completely and he is cancer free now. However, he does  have a high risk of recurrence due to his locally advanced disease.  -we discussed adjuvant chemo is recommend and standard care for stage III colon cancer. So I strongly suggest he consider aduvant chemotherapy to reduce his risk of recurrence.  -We reviewed his chemo options, including single agent Xeloda or 5-FU for 6 months, or more standard option of iv chemo Oxaliplatin and 5-fu Pump (FOLFOX) every 2 weeks for 3 months.  -Patient has decided to take Xeloda alone, he declined intravenous chemotherapy. -He has started adjuvant Xeloda 08/05/17, tolerated first cycle very well, except mild palm erythema.  -After cycle 2 he is fatigued with mild palmar-plantar erythema and moderate watery stool.  -for palmar-plantar erythema, I recommended over the counter hydrocortisone cream to hands and feet.  -Reduced 2000 mg (4 tablets) in AM,  1500 mg (3 tablets) in PM with cycle 3, held cycle 3 since 09/08/17 due to to GI toxicity and left hand cellulitis  -received IVF 10/19 -Labs reviewed today, His counts have improved  due to infection resolving but will hold chemo another week to fully recover. He will call us next week to confirm he is ready to restart cycle 3 Xeloda -Follow up on 10/06/17  2. Nausea, diarrhea and fatigue  -We discussed these side effects are secondary to xeloda -He has intermittent mild nausea, I have prescribed zofran PRN for him to try -I recommend imodium with first dose at first episode of loose stool, can take up to 8 per day per package instructions; he will buy today; I prescribed lomotil in casse imodium is not enough -I have encouraged him to increase po fluids -His K is low, 3.0 (09/08/17), I added 20 mEq to NS IVF today and have sent prescription for 20 mEq oral K supplement, He will take 2 BID x3 days then 1 daily for 1 week -I encouraged him to take zofran 30 minutes before morning and evening meals to optimize anti-nausea effect and maximize meal time nutrition. -Potassium normalized 09/22/17 to 3.6  3. Dehydration -He has positional lightheadedness and low BP in clinic today -his creatinine is elevated 1.7 (09/08/17) -I have encouraged him to increase po liquid intake, will arrange for 1 L IVF today +20 mEq K over 2 hours -I suggest he drinks no more than 30-40 ounces of liquid a day as this can contribute to swelling.   4. Hyperbilirubinemia -bili is elevated to 1.3 (09/08/17), likely secondary to xeloda. -I will hold for 1 week, given IVF (09/08/17) , and recheck labs in 1 week -Resolved at Total bili at 0.39 (09/22/17)  5. osteoarthritis  -f/u and monitoring   6. Nutrition, weight loss -he has lost 12 pounds in 3 weeks, related to decreased po intake, nausea, mucositis, and diarrhea secondary to xeloda -I will refer to our dietician for evaluation -He drinks 3-4 boost per day and  will continue.  7. AKI and hypokalemia  -Cr went from 1.1 on 07/22/17 to 1.7 on 09/08/2017, likely secondary to dehydration from diarrhea and adjuvant chemotherapy -Was given IV fluids with KCL  -oral KCL called in previously, he will continue oral potassium. I have encouraged him to use these to maximum doses for optimal benefit -I strongly encouraged him to drink more fluids -Potassium increased to 3.6. He will continue potassium pill once every other day.  -Liver enzymes normalized, Cr 1.1 (09/22/17)  8. Left Hand Cellulitits -He took keflex for left hand cellulitis, completed.  -He tried urea cream and uses 3-4 times a night.  -I also suggest neosporin cream if needed.   PLAN:  -Will call us if he is ready to restart reduced dose cycle 3 Xeloda before next visit.  -lab and f/u 10/06/17    No orders of the defined types were placed in this encounter.   All questions were answered. The patient knows to call the clinic with any problems, questions or concerns. I spent 15 minutes counseling the patient face to face. The total time spent in the appointment was 20 minutes and more than 50% was on counseling.      Truitt Merle, MD 09/22/2017    This document serves as a record of services personally performed by Truitt Merle, MD. It was created on her behalf by Joslyn Devon, a trained medical scribe. The creation of this record is based on the scribe's personal observations and the provider's statements to them. This document has been checked and approved by the attending provider.

## 2017-09-24 ENCOUNTER — Encounter: Payer: Self-pay | Admitting: Hematology

## 2017-09-29 ENCOUNTER — Ambulatory Visit: Payer: Medicare Other | Admitting: Nutrition

## 2017-09-29 NOTE — Progress Notes (Signed)
71 year old male diagnosed with colon cancer.  Past medical history includes vocal cord paralysis.  Medications include Xeloda, Lomotil, Colace, Zofran, MiraLAX, K dur.  Labs were reviewed.  Height: 6 feet 1 inch. Weight: 243.1 pounds October 24. Usual body weight: Approximately 240 pounds. BMI: 32.07.  Patient reports that he has stopped taking Xeloda for the time being. His appetite has improved.   Reports he had some swelling, which could attribute to increase weight. Patient currently denies nutrition impact symptoms or questions about nutrition.  Nutrition diagnosis: No diagnosis at this time.  Provided brief education for patient to consume smaller more frequent meals and snacks with adequate calories and protein for weight maintenance. I provided a fact sheet. Also provided patient with coupons, and recommended patient reduce boost to one bottle daily. Encouraged patient to contact me with questions.  No follow-up scheduled at this time.  **Disclaimer: This note was dictated with voice recognition software. Similar sounding words can inadvertently be transcribed and this note may contain transcription errors which may not have been corrected upon publication of note.**

## 2017-10-04 NOTE — Progress Notes (Signed)
[place]  [phone] [phone]  Clinic Follow Up Note   Patient Care Team: Alroy Dust, L.Marlou Sa, MD as PCP - General (Family Medicine) Leighton Ruff, MD as Consulting Physician (General Surgery) Truitt Merle, MD as Consulting Physician (Hematology) Wonda Horner, MD as Consulting Physician (Gastroenterology) 10/06/2017   CHIEF COMPLAINTS:  Follow Up for Colon Cancer  Oncology History   Cancer Staging Cancer of left colon [facility]) Staging form: Colon and Rectum, AJCC 8th Edition - Pathologic stage from 06/09/2017: Stage IIIB (pT3, pN1b, cM0) - Signed by Truitt Merle, MD on 07/06/2017       Cancer of left colon ([place])   06/08/2017 Initial Diagnosis    Cancer of left colon ([place])     06/08/2017 Imaging    CT AP W Contrast 06/08/17 IMPRESSION: 1. Mass of the transverse colon near splenic flexure measuring up to 4 cm compatible with colonic adenocarcinoma. No lymphadenopathy by imaging criteria. 2. Proximal dilatation of ascending and transverse colon with mild inflammatory changes likely due to obstruction. 3. 4 mm right middle lobe pulmonary nodule, attention on follow-up recommended. 4. 2 mm right kidney nonobstructing nephrolithiasis.      06/09/2017 Surgery    LAPAROSCOPY DIAGNOSTIC, OPEN LEFT COLECTOMY by Dr. Marcello Moores      06/09/2017 Pathology Results    Diagnosis 06/09/17 Colon, segmental resection for tumor, left - INVASIVE ADENOCARCINOMA, WELL DIFFERENTIATED, SPANNING 5 CM. - TUMOR INVADES THROUGH MUSCULARIS PROPRIA INTO SUBSEROSAL TISSUE. - RESECTION MARGINS ARE NEGATIVE. - METASTATIC CARCINOMA IN TWO OF TWENTY-SEVEN LYMPH NODES (2/27). - SEE ONCOLOGY TABLE.       07/20/2017 Imaging    CT Chest W Contrast IMPRESSION: No evidence of metastatic disease or other acute findings within the thorax.      08/05/2017 -  Chemotherapy    Xeloda 2552m in am and 20057mpm, 2 weeks on and 1 week off, starting on 08/05/17 and plan for 6 months    Reduced to 2000 mg (4 tablets) in AM, 1500 mg (3 tablets) in PM with cycle 3, cycle 3 has been held since 08/1017 due to GI toxicity and active infection.         HISTORY OF PRESENTING ILLNESS: 07/07/17 James Chroman89.o. male is here because of newly diagnosed cancer of left colon. He presents to the clinic with his wife, daughter and granddaughter. Referred by Dr. ThMarcello MooresHe was diagnosed with screening colonoscopy by Dr. [place]ancy Milroyt [place] for [place] colonoscopy: Before that He had intermittent sever stomach pain, and reported abdominal bloating and swelling. He also had some constipation. He developed bowel obstruction after the colonoscopy and was admitted to hospital. He underwent left segmental colon resection by Dr. ThMarcello Mooresn 06/09/2017.   today he feels fine since surgery. His incision is healing and stoma is well. His appetite is back to normal overall. Sometimes he will lower his intake when he afraid his bag will get too full  His bowel will vary from loose to formed. It's mostly is pasty. No blood in stool since surgery. he empties 2-3 times a day. He denies fever or chills. His activity is better but he still takes a nap when needed.  He plays music/string instruments. He is not to pick up on anything that could strain his stomach. He has tingling in his right hand fingers slightly since surgery. 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 He is somewhat active and tries to walk. His family wanted to clear up the side of which his tumor was on and that delayed his surgery.   He plays music around town several times a week. wife wants to know will he get as much hep from pill as with IV chemo.  His family shares the questions with his chemo options.   Past, for his knee replacement he was on blood thinner and he noticed blood in stool after surgery. He has not been diagnosed with any other significant issues. He  takes tylenol when needed. He has arthritis in his hands. Ear surgery due to ear drum burst at 16. 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 His maternal uncle had colon cancer.  CURRENT THERAPY: Xeloda 2564m in am and 20062mpm, 2 weeks on and 1 week off, started on 08/05/2017. Reduced to 2000 mg (4 tablets) in AM, 1500 mg (3 tablets) in PM with cycle 3, cycle 3 has been held since 08/1017 due to GI toxicity and active infection. Start reduced cycle 3 on 10/07/17.  INTERVAL HISTORY:  James SwTutsonresents to the clinic for follow up. He presented to the clinic today with is wife and daughter.  He notes his left hand still has cellulitis. He notes his skin is thin and cracking. Family feels it looks better overall. He notes 2 fingers on his right hand has tingling. His appetite is fine and his BM is back to normal. He has not restarted Xeloda.  He notes last night he empties his bag and soon after it would fill back up. His stool was not watery and this occurs occasionally. Her averages 4 times a day of emptying his bag.     MEDICAL HISTORY:  Past Medical History:  Diagnosis Date  . Arthritis   . H/O vocal cord paralysis    left     SURGICAL HISTORY: Past Surgical History:  Procedure Laterality Date  . BACK SURGERY     times 2  . INNER EAR SURGERY     bilat  . PROSTATE BIOPSY    . TONSILLECTOMY      SOCIAL HISTORY: Social History   Socioeconomic History  . Marital status: Married    Spouse name: Not on file  . Number of children: Not on file  . Years of education: Not on file  . Highest education level: Not on file  Social Needs  . Financial resource strain: Not on file  . Food insecurity - worry: Not on file  . Food insecurity - inability: Not on file  . Transportation needs - medical: Not on file  . Transportation needs - non-medical: Not on file  Occupational History  . Not on file  Tobacco Use  . Smoking status: Never Smoker  .  Smokeless tobacco: Never Used  Substance and Sexual Activity  . Alcohol use: No  . Drug use: No  . Sexual activity: Not on file  Other Topics Concern  . Not on file  Social History Narrative  . Not on file    FAMILY HISTORY: Family History  Problem Relation Age of Onset  . Heart failure Mother   . Stroke Father   . Cancer Maternal Uncle        colon cancer    ALLERGIES:  has No Known Allergies.  MEDICATIONS:  Current Outpatient Medications  Medication Sig Dispense Refill  . acetaminophen (TYLENOL) 500 MG tablet Take 2 tablets (1,000  mg total) by mouth every 8 (eight) hours as needed. 30 tablet 0  . docusate sodium (COLACE) 100 MG capsule Take 200 mg by mouth 2 (two) times daily as needed for mild constipation.    . ondansetron (ZOFRAN) 8 MG tablet Take 1 tablet (8 mg total) by mouth every 8 (eight) hours as needed for nausea or vomiting. 20 tablet 1  . urea (CARMOL) 10 % cream Apply topically as needed. 71 g 0  . capecitabine (XELODA) 500 MG tablet TAKE 5 TABS (2500MG) IN AM & 4 TABS (2000MG) IN PM FOR 14 DAYS, THEN OFF 7 DAYS. (Patient not taking: Reported on 10/06/2017) 126 tablet 3  . diphenoxylate-atropine (LOMOTIL) 2.5-0.025 MG tablet Take 1 tablet by mouth 4 (four) times daily as needed for diarrhea or loose stools. (Patient not taking: Reported on 10/06/2017) 40 tablet 0  . polyethylene glycol (MIRALAX / GLYCOLAX) packet Take 17 g by mouth daily as needed for mild constipation.    . potassium chloride SA (K-DUR,KLOR-CON) 20 MEQ tablet Take 1 tablet (20 mEq total) by mouth once. 14 tablet 1   No current facility-administered medications for this visit.     REVIEW OF SYSTEMS:   Constitutional: Denies fevers, chills or abnormal night sweats Eyes: Denies blurriness of vision, double vision or watery eyes Ears, nose, mouth, throat, and face: Denies mucositis or sore throat Respiratory: Denies cough, dyspnea or wheezes Cardiovascular: Denies palpitation, chest discomfort or  lower extremity swelling Gastrointestinal:  Denies nausea, heartburn or change in bowel habits  Skin: Denies abnormal skin rashes (+) cellulitis on left hand, improved. (+) dry and cracked hands Lymphatics: Denies new lymphadenopathy or easy bruising Neurological:Denies numbness, new weaknesses (+) tingling on right hands fingers  Behavioral/Psych: Mood is stable, no new changes  All other systems were reviewed with the patient and are negative.  PHYSICAL EXAMINATION:  ECOG PERFORMANCE STATUS: 1 - Symptomatic but completely ambulatory BP 137/74 (BP Location: Left Arm, Patient Position: Sitting)   Pulse 68   Temp 98.2 F (36.8 C) (Oral)   Resp 19   Ht 6' 1"  (1.854 m)   Wt 237 lb 6.4 oz (107.7 kg)   SpO2 100%   BMI 31.32 kg/m   GENERAL:alert, no distress and comfortable SKIN: skin color, texture, turgor are normal, no rashes or significant lesions, (+) diffuse erythema on his left palm, no skin peeling or cracks, much improved EYES: normal, conjunctiva are pink and non-injected, sclera clear OROPHARYNX:no exudate, no erythema and lips, buccal mucosa, and tongue normal  NECK: supple, thyroid normal size, non-tender, without nodularity LYMPH:  no palpable lymphadenopathy in the cervical, axillary or inguinal LUNGS: clear to auscultation and percussion with normal breathing effort HEART: regular rate & rhythm and no murmurs and no lower extremity edema ABDOMEN: abdomen soft, non-tender and normal bowel sounds, (+) colostomy bag, his midline incision has healed well  Musculoskeletal:no cyanosis of digits and no clubbing  PSYCH: alert & oriented x 3 with fluent speech NEURO: no focal motor/sensory deficits  LABORATORY DATA:  I have reviewed the data as listed CBC Latest Ref Rng & Units 10/06/2017 09/22/2017 09/15/2017  WBC 4.0 - 10.3 10e3/uL 7.0 10.1 15.9(H)  Hemoglobin 13.0 - 17.1 g/dL 11.9(L) 10.8(L) 12.7(L)  Hematocrit 38.4 - 49.9 % 37.3(L) 33.2(L) 39.5  Platelets 140 - 400 10e3/uL  142 195 266    CMP Latest Ref Rng & Units 10/06/2017 09/22/2017 09/15/2017  Glucose 70 - 140 mg/dl 94 174(H) 97  BUN 7.0 - 26.0 mg/dL 12.8 12.0  22.4  Creatinine 0.7 - 1.3 mg/dL 1.1 1.1 1.2  Sodium 136 - 145 mEq/L 144 140 135(L)  Potassium 3.5 - 5.1 mEq/L 4.1 3.6 3.4(L)  Chloride 101 - 111 mmol/L - - -  CO2 22 - 29 mEq/L 28 23 20(L)  Calcium 8.4 - 10.4 mg/dL 9.4 8.8 8.8  Total Protein 6.4 - 8.3 g/dL 6.8 5.5(L) 5.6(L)  Total Bilirubin 0.20 - 1.20 mg/dL 0.43 0.39 0.45  Alkaline Phos 40 - 150 U/L 89 89 78  AST 5 - 34 U/L 21 31 25   ALT 0 - 55 U/L 14 41 36   CEA (<5.6ng/ml) 06/09/2017: 1.8  07/22/2017: 1.34 10/06/17: PENDING   PATHOLOGY  Diagnosis 06/09/17 Colon, segmental resection for tumor, left - INVASIVE ADENOCARCINOMA, WELL DIFFERENTIATED, SPANNING 5 CM. - TUMOR INVADES THROUGH MUSCULARIS PROPRIA INTO SUBSEROSAL TISSUE. - RESECTION MARGINS ARE NEGATIVE. - METASTATIC CARCINOMA IN TWO OF TWENTY-SEVEN LYMPH NODES (2/27). - SEE ONCOLOGY TABLE. Microscopic Comment COLON AND RECTUM (INCLUDING TRANS-ANAL RESECTION): Specimen: Left colon. Procedure: Segmental resection. Tumor site: Transverse colon. Specimen integrity: Intact. Macroscopic tumor perforation: Not identified. Invasive tumor: Maximum size: 5 cm Histologic type(s): Invasive adenocarcinoma. Histologic grade and differentiation: G1: well differentiated/low grade Type of polyp in which invasive carcinoma arose: N/A. Microscopic extension of invasive tumor: Tumor invades through muscularis propria into subserosal tissue. Lymph-Vascular invasion: Definitive invasion not identified. Peri-neural invasion: Not identified. Tumor deposit(s) (discontinuous extramural extension): Not identified. Resection margins: Proximal margin: Negative. Distal margin: Negative. Circumferential (radial) (posterior ascending, posterior descending; lateral and posterior mid-rectum; and entire lower 1/3 rectum): Negative. Mesenteric margin  (sigmoid and transverse): Negative. Distance closest margin (if all above margins negative): 2 cm (mesenteric). Treatment effect (neo-adjuvant therapy): N/A. Microscopic Comment(continued) Additional polyp(s): None. Non-neoplastic findings: Diverticulosis. Lymph nodes: number examined 27; number positive: 2 Pathologic Staging: pT3, pN1b, pMX Ancillary studies: MSI and MMR will be ordered.   RADIOGRAPHIC STUDIES: I have personally reviewed the radiological images as listed and agreed with the findings in the report. No results found.  ASSESSMENT & PLAN:  James Doyle is a 71 y.o. male who has a history of arthritis.   1. Left colon cancer, invasive adenocarcinoma, G1, pT3N1bM0, stage IIIB, MSI-stable  -We reviewed his colon cancer and pathology results. it's stage IIIB with 2 out of 27 positive lymph nodes.  -staging CT abd/pel  Was negative for metastatic disease. -His CT chest was negative for metastasis, I reviewed the results with patient -His tumor was resected completely and he is cancer free now. However, he does  have a high risk of recurrence due to his locally advanced disease.  -we discussed adjuvant chemo is recommend and standard care for stage III colon cancer. So I strongly suggest he consider adjuvant chemotherapy to reduce his risk of recurrence.  -We reviewed his chemo options, including single agent Xeloda or 5-FU for 6 months, or more standard option of iv chemo Oxaliplatin and 5-fu Pump (FOLFOX) every 2 weeks for 3 months.  -Patient has decided to take Xeloda alone, he declined intravenous chemotherapy. -He has started adjuvant Xeloda 08/05/17, tolerated first cycle very well, except mild palm erythema.  -After cycle 2 he is fatigued with mild palmar-plantar erythema and moderate watery stool.  -Ror palmar-plantar erythema, I recommended over the counter hydrocortisone cream to hands and feet.  -Cycle 3 chemo has been postponed due to side effects, he has recovered  well.  Will reduce to 2000 mg (4 tablets) in AM, 1500 mg (3 tablets) in PM with cycle  3, will start reduced cycle 3 on 10/07/17  -I explained he will complete at least 2 more treatments (a total of 3 months), if he tolerates well, will extend further to a total of 6 months therapy -Labs reviewed and potasium and kidney/liver function are normal and HG is 11.9  Today, WBC is normal. Labs adequate to proceed with  Next cycle Xeloda.  -F/u in 3 weeks before cycle 4    2. Nausea, diarrhea and fatigue  -We discussed these side effects are secondary to Xeloda -He has intermittent mild nausea, I have prescribed Zofran PRN for him to try -I recommend imodium with first dose at first episode of loose stool, can take up to 8 per day per package instructions; he will buy today; I prescribed lomotil in case imodium is not enough -I have encouraged him to increase po fluids -His K is low, 3.0 (09/08/17), I added 20 mEq to NS IVF today and have sent prescription for 20 mEq oral K supplement, He will take 2 BID x3 days then 1 daily for 1 week -I encouraged him to take Zofran 30 minutes before morning and evening meals to optimize anti-nausea effect and maximize meal time nutrition. -Diarrhea currently controlled.  3. osteoarthritis  -f/u and monitoring   4. malnutrition, weight loss -he has lost 12 pounds in 3 weeks, related to decreased po intake, nausea, mucositis, and diarrhea secondary to Xeloda -f/u with dietician  -He drinks 3-4 boost per day and will continue.  5. AKI and hypokalemia  -Cr went from 1.1 on 07/22/17 to 1.7 on 09/08/2017, likely secondary to dehydration from diarrhea and adjuvant chemotherapy -Was given IV fluids with KCL  -oral KCL called in previously, he will continue oral potassium. I have encouraged him to use these to maximum doses for optimal benefit -I strongly encouraged him to drink more fluids -Potassium increased to 3.6. He will continue potassium pill once every other  day.  -Liver enzymes and potassium have normalized as of 10/06/17    PLAN:  -Start cycle 3 Xeloda, 2000 mg (4 tablets) in AM, 1500 mg (3 tablets) in PM, on 10/07/17 -Lab and f/u in 3 weeks.     No orders of the defined types were placed in this encounter.   All questions were answered. The patient knows to call the clinic with any problems, questions or concerns. I spent 20 minutes counseling the patient face to face. The total time spent in the appointment was 25 minutes and more than 50% was on counseling.      Truitt Merle, MD 10/06/2017    This document serves as a record of services personally performed by Truitt Merle, MD. It was created on her behalf by Joslyn Devon, a trained medical scribe. The creation of this record is based on the scribe's personal observations and the provider's statements to them.    I have reviewed the above documentation for accuracy and completeness, and I agree with the above.

## 2017-10-06 ENCOUNTER — Other Ambulatory Visit (HOSPITAL_BASED_OUTPATIENT_CLINIC_OR_DEPARTMENT_OTHER): Payer: Medicare Other

## 2017-10-06 ENCOUNTER — Ambulatory Visit (HOSPITAL_BASED_OUTPATIENT_CLINIC_OR_DEPARTMENT_OTHER): Payer: Medicare Other | Admitting: Hematology

## 2017-10-06 ENCOUNTER — Telehealth: Payer: Self-pay | Admitting: Hematology

## 2017-10-06 ENCOUNTER — Encounter: Payer: Self-pay | Admitting: Hematology

## 2017-10-06 VITALS — BP 137/74 | HR 68 | Temp 98.2°F | Resp 19 | Ht 73.0 in | Wt 237.4 lb

## 2017-10-06 DIAGNOSIS — M170 Bilateral primary osteoarthritis of knee: Secondary | ICD-10-CM

## 2017-10-06 DIAGNOSIS — N179 Acute kidney failure, unspecified: Secondary | ICD-10-CM | POA: Diagnosis not present

## 2017-10-06 DIAGNOSIS — E46 Unspecified protein-calorie malnutrition: Secondary | ICD-10-CM | POA: Diagnosis not present

## 2017-10-06 DIAGNOSIS — R110 Nausea: Secondary | ICD-10-CM

## 2017-10-06 DIAGNOSIS — R630 Anorexia: Secondary | ICD-10-CM

## 2017-10-06 DIAGNOSIS — C186 Malignant neoplasm of descending colon: Secondary | ICD-10-CM | POA: Diagnosis not present

## 2017-10-06 DIAGNOSIS — C779 Secondary and unspecified malignant neoplasm of lymph node, unspecified: Secondary | ICD-10-CM

## 2017-10-06 DIAGNOSIS — Z800 Family history of malignant neoplasm of digestive organs: Secondary | ICD-10-CM | POA: Diagnosis not present

## 2017-10-06 DIAGNOSIS — E876 Hypokalemia: Secondary | ICD-10-CM

## 2017-10-06 DIAGNOSIS — R197 Diarrhea, unspecified: Secondary | ICD-10-CM | POA: Diagnosis not present

## 2017-10-06 DIAGNOSIS — M19041 Primary osteoarthritis, right hand: Secondary | ICD-10-CM | POA: Diagnosis not present

## 2017-10-06 DIAGNOSIS — M19042 Primary osteoarthritis, left hand: Secondary | ICD-10-CM | POA: Diagnosis not present

## 2017-10-06 LAB — CBC WITH DIFFERENTIAL/PLATELET
BASO%: 0.6 % (ref 0.0–2.0)
Basophils Absolute: 0 10*3/uL (ref 0.0–0.1)
EOS ABS: 0.5 10*3/uL (ref 0.0–0.5)
EOS%: 7.8 % — ABNORMAL HIGH (ref 0.0–7.0)
HEMATOCRIT: 37.3 % — AB (ref 38.4–49.9)
HGB: 11.9 g/dL — ABNORMAL LOW (ref 13.0–17.1)
LYMPH#: 1.6 10*3/uL (ref 0.9–3.3)
LYMPH%: 22.5 % (ref 14.0–49.0)
MCH: 26.4 pg — ABNORMAL LOW (ref 27.2–33.4)
MCHC: 31.8 g/dL — ABNORMAL LOW (ref 32.0–36.0)
MCV: 83 fL (ref 79.3–98.0)
MONO#: 0.8 10*3/uL (ref 0.1–0.9)
MONO%: 11.4 % (ref 0.0–14.0)
NEUT%: 57.7 % (ref 39.0–75.0)
NEUTROS ABS: 4 10*3/uL (ref 1.5–6.5)
PLATELETS: 142 10*3/uL (ref 140–400)
RBC: 4.49 10*6/uL (ref 4.20–5.82)
RDW: 27.3 % — ABNORMAL HIGH (ref 11.0–14.6)
WBC: 7 10*3/uL (ref 4.0–10.3)

## 2017-10-06 LAB — COMPREHENSIVE METABOLIC PANEL
ALBUMIN: 3.6 g/dL (ref 3.5–5.0)
ALK PHOS: 89 U/L (ref 40–150)
ALT: 14 U/L (ref 0–55)
AST: 21 U/L (ref 5–34)
Anion Gap: 9 mEq/L (ref 3–11)
BUN: 12.8 mg/dL (ref 7.0–26.0)
CALCIUM: 9.4 mg/dL (ref 8.4–10.4)
CHLORIDE: 107 meq/L (ref 98–109)
CO2: 28 mEq/L (ref 22–29)
Creatinine: 1.1 mg/dL (ref 0.7–1.3)
GLUCOSE: 94 mg/dL (ref 70–140)
POTASSIUM: 4.1 meq/L (ref 3.5–5.1)
SODIUM: 144 meq/L (ref 136–145)
Total Bilirubin: 0.43 mg/dL (ref 0.20–1.20)
Total Protein: 6.8 g/dL (ref 6.4–8.3)

## 2017-10-06 LAB — CEA (IN HOUSE-CHCC): CEA (CHCC-In House): 3 ng/mL (ref 0.00–5.00)

## 2017-10-06 NOTE — Telephone Encounter (Signed)
Scheduled appt per 1/17 los - Gave patient AVS And calender per los.  

## 2017-10-25 ENCOUNTER — Other Ambulatory Visit: Payer: Self-pay | Admitting: Hematology

## 2017-10-25 DIAGNOSIS — C186 Malignant neoplasm of descending colon: Secondary | ICD-10-CM

## 2017-10-25 NOTE — Progress Notes (Signed)
James Doyle  [phone] [phone]  Clinic Follow Up Note   Patient Care Team: James Doyle, L.James Sa, MD as PCP - General (Family Medicine) James Ruff, MD as Consulting Physician (General Surgery) James Merle, MD as Consulting Physician (Hematology) James Horner, MD as Consulting Physician (Gastroenterology) 10/27/2017   CHIEF COMPLAINTS:  Follow Up for Colon Cancer  Oncology History   Cancer Staging Cancer of left colon James Doyle) Staging form: Colon and Rectum, AJCC 8th Edition - Pathologic stage from 06/09/2017: Stage IIIB (pT3, pN1b, cM0) - Signed by James Merle, MD on 07/06/2017       Cancer of left colon (James Doyle)   06/08/2017 Initial Diagnosis    Cancer of left colon ([place])      06/08/2017 Imaging    CT AP W Contrast 06/08/17 IMPRESSION: 1. Mass of the transverse colon near splenic flexure measuring up to 4 cm compatible with colonic adenocarcinoma. No lymphadenopathy by imaging criteria. 2. Proximal dilatation of ascending and transverse colon with mild inflammatory changes likely due to obstruction. 3. 4 mm right middle lobe pulmonary nodule, attention on follow-up recommended. 4. 2 mm right kidney nonobstructing nephrolithiasis.      06/09/2017 Surgery    LAPAROSCOPY DIAGNOSTIC, OPEN LEFT COLECTOMY by Dr. Marcello Doyle      06/09/2017 Pathology Results    Diagnosis 06/09/17 Colon, segmental resection for tumor, left - INVASIVE ADENOCARCINOMA, WELL DIFFERENTIATED, SPANNING 5 CM. - TUMOR INVADES THROUGH MUSCULARIS PROPRIA INTO SUBSEROSAL TISSUE. - RESECTION MARGINS ARE NEGATIVE. - METASTATIC CARCINOMA IN TWO OF TWENTY-SEVEN LYMPH NODES (2/27). - SEE ONCOLOGY TABLE.       07/20/2017 Imaging    CT Chest W Contrast IMPRESSION: No evidence of metastatic disease or other acute findings within the thorax.      08/05/2017 -  Chemotherapy    Xeloda 2576m in am and 20058mpm, 2 weeks on and 1 week off, starting on 08/05/17 and plan for 6 months     Reduced to 2000 mg (4 tablets) in AM, 1500 mg (3 tablets) in PM with cycle 3, cycle 3 has been held since 08/1017 due to GI toxicity and active infection.         HISTORY OF PRESENTING ILLNESS: 07/07/17 [name]35.o. male is here because of newly diagnosed cancer of left colon. He presents to the clinic with his wife, daughter and granddaughter. Referred by Dr. ThMarcello MooresHe was diagnosed with screening colonoscopy by Dr. AnChancy Milroyt E[place] was due for his colonoscopy: Before that He had intermittent sever stomach pain, and reported abdominal bloating and swelling. He also had some constipation. He developed bowel obstruction after the colonoscopy and was admitted to hospital. He underwent left segmental colon resection by Dr. ThMarcello Mooresn 06/09/2017.   today he feels fine since surgery. His incision is healing and stoma is well. His appetite is back to normal overall. Sometimes he will lower his intake when he afraid his bag will get too full  His bowel will vary from loose to formed. It's mostly is pasty. No blood in stool since surgery. he empties 2-3 times a day. He denies fever or chills. His activity is better but he still takes a nap when needed.  He plays music/string instruments. He is not to pick up on anything that could strain his stomach. He has tingling in his right hand fingers slightly since surgery. 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 He is somewhat active and tries to walk. His family wanted to clear up the side of which his tumor was on and that delayed his surgery.   He plays music around town several times a week. wife wants to know will he get as much hep from pill as with IV chemo.  His family shares the questions with his chemo options.   Past, for his knee replacement he was on blood thinner and he noticed blood in stool after surgery. He has not been diagnosed with any other significant issues. He  takes tylenol when needed. He has arthritis in his hands. Ear surgery due to ear drum burst at 16. 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 His maternal uncle had colon cancer.  CURRENT THERAPY:  Xeloda 25101m in am and 20038mpm, 2 weeks on and 1 week off, started on 08/05/2017. Reduced to 2000 mg (4 tablets) in AM, 1500 mg (3 tablets) in PM with cycle 3, cycle 3 has been held since 08/1017 due to GI toxicity and active infection. Start reduced cycle 3 on 10/07/17.  INTERVAL HISTORY:  James SwCallananresents to the clinic for follow up. He presented to the clinic today with is wife and daughter.  He notes he is better with reduced chemo on cycle 3, but still feels the effects. He does have palmar erythema. He tried to play music but he dropped it due to thinner skin and cannot grip his pick. His feet feel like he is walking on tape. He does have skin peeling and dryness. He denies blistering. He notes having 28 pills left over from cycle 3. He would need 70 more pills to get through his cycle 4. He has MyChart but he does not have good Internet at home to always check it. He went to PCP this week and he did not recommend flu shot at this point in chemotherapy. He has never had a reaction to the flu shot before and is willing to get the flu shot today.    MEDICAL HISTORY:  Past Medical History:  Diagnosis Date  . Arthritis   . H/O vocal cord paralysis    left     SURGICAL HISTORY: Past Surgical History:  Procedure Laterality Date  . BACK SURGERY     times 2  . INNER EAR SURGERY     bilat  . LAPAROSCOPY N/A 06/09/2017   Procedure: LAPAROSCOPY DIAGNOSTIC, OPEN  LEFT COLECTOMY;  Surgeon: ThLeighton RuffMD;  Location: [facility];  Service: General;  Laterality: N/A;  . PROSTATE BIOPSY    . TONSILLECTOMY    . TOTAL KNEE ARTHROPLASTY Right 07/27/2016   Procedure: RIGHT TOTAL KNEE ARTHROPLASTY;  Surgeon: FrGaynelle ArabianMD;  Location: [facility];  Service: Orthopedics;   Laterality: Right;    SOCIAL HISTORY: Social History   Socioeconomic History  . Marital status: Married    Spouse name: Not on file  . Number of children: Not on file  . Years of education: Not on file  . Highest education level: Not on file  Social Needs  . Financial resource strain: Not on file  . Food insecurity - worry: Not on file  . Food insecurity - inability: Not on file  . Transportation needs - medical: Not on file  . Transportation needs - non-medical: Not on file  Occupational History  . Not on file  Tobacco Use  . Smoking status: Never Smoker  . Smokeless tobacco: Never  Used  Substance and Sexual Activity  . Alcohol use: No  . Drug use: No  . Sexual activity: Not on file  Other Topics Concern  . Not on file  Social History Narrative  . Not on file    FAMILY HISTORY: Family History  Problem Relation Age of Onset  . Heart failure Mother   . Stroke Father   . Cancer Maternal Uncle        colon cancer    ALLERGIES:  has No Known Allergies.  MEDICATIONS:  Current Outpatient Medications  Medication Sig Dispense Refill  . capecitabine (XELODA) 500 MG tablet TAKE 4 TABS (2500MG) IN AM & 3 TABS (2000MG) IN PM FOR 14 DAYS, THEN OFF 7 DAYS. Pt has 28 tabs at home from previous cycle 70 tablet 0  . ibuprofen (ADVIL,MOTRIN) 200 MG tablet Take 200 mg by mouth every 6 (six) hours as needed.    . potassium chloride Doyle (K-DUR,KLOR-CON) 20 MEQ tablet Take 1 tablet (20 mEq total) by mouth once. 14 tablet 1  . urea (CARMOL) 10 % cream Apply topically as needed. 71 g 0  . acetaminophen (TYLENOL) 500 MG tablet Take 2 tablets (1,000 mg total) by mouth every 8 (eight) hours as needed. (Patient not taking: Reported on 10/27/2017) 30 tablet 0  . diphenoxylate-atropine (LOMOTIL) 2.5-0.025 MG tablet Take 1 tablet by mouth 4 (four) times daily as needed for diarrhea or loose stools. (Patient not taking: Reported on 10/06/2017) 40 tablet 0  . docusate sodium (COLACE) 100 MG capsule  Take 200 mg by mouth 2 (two) times daily as needed for mild constipation.    . ondansetron (ZOFRAN) 8 MG tablet Take 1 tablet (8 mg total) by mouth every 8 (eight) hours as needed for nausea or vomiting. (Patient not taking: Reported on 10/27/2017) 20 tablet 1  . polyethylene glycol (MIRALAX / GLYCOLAX) packet Take 17 g by mouth daily as needed for mild constipation.     No current facility-administered medications for this visit.     REVIEW OF SYSTEMS:   Constitutional: Denies fevers, chills or abnormal night sweats Eyes: Denies blurriness of vision, double vision or watery eyes Ears, nose, mouth, throat, and face: Denies mucositis or sore throat Respiratory: Denies cough, dyspnea or wheezes Cardiovascular: Denies palpitation, chest discomfort or lower extremity swelling Gastrointestinal:  Denies nausea, heartburn or change in bowel habits  Skin: Denies abnormal skin rashes (+) cellulitis on left hand, resolved. (+) dry, and red hands (+) dry skin on feet  Lymphatics: Denies new lymphadenopathy or easy bruising Neurological:Denies numbness, new weaknesses (+) tingling on right hands fingers, decrease in gripping  Behavioral/Psych: Mood is stable, no new changes  All other systems were reviewed with the patient and are negative.  PHYSICAL EXAMINATION:  ECOG PERFORMANCE STATUS: 1 - Symptomatic but completely ambulatory BP (!) 160/89 (BP Location: Left Arm, Patient Position: Sitting) Comment: nurse aware  Pulse 63   Temp 98.8 F (37.1 C) (Oral)   Resp 18   Ht _0  (1.854 m)   Wt 241 lb 12.8 oz (109.7 kg)   SpO2 100%   BMI 31.90 kg/m   GENERAL:alert, no distress and comfortable SKIN: skin color, texture, turgor are normal, no rashes or significant lesions, (+) mild diffuse erythema on his palms, no skin peeling or cracks, much improved (+) dry and peeling skin on feet EYES: normal, conjunctiva are pink and non-injected, sclera clear OROPHARYNX:no exudate, no erythema and lips, buccal  mucosa, and tongue normal  NECK: supple, thyroid  normal size, non-tender, without nodularity LYMPH:  no palpable lymphadenopathy in the cervical, axillary or inguinal LUNGS: clear to auscultation and percussion with normal breathing effort HEART: regular rate & rhythm and no murmurs and no lower extremity edema ABDOMEN: abdomen soft, non-tender and normal bowel sounds, (+) colostomy bag, his midline incision has healed well  Musculoskeletal:no cyanosis of digits and no clubbing  PSYCH: alert & oriented x 3 with fluent speech NEURO: no focal motor/sensory deficits  LABORATORY DATA:  I have reviewed the data as listed CBC Latest Ref Rng & Units 10/27/2017 10/06/2017 09/22/2017  WBC 4.0 - 10.3 10e3/uL 8.5 7.0 10.1  Hemoglobin 13.0 - 17.1 g/dL 12.5(L) 11.9(L) 10.8(L)  Hematocrit 38.4 - 49.9 % 38.9 37.3(L) 33.2(L)  Platelets 140 - 400 10e3/uL 212 142 195    CMP Latest Ref Rng & Units 10/27/2017 10/06/2017 09/22/2017  Glucose 70 - 140 mg/dl 94 94 174(H)  BUN 7.0 - 26.0 mg/dL 13.0 12.8 12.0  Creatinine 0.7 - 1.3 mg/dL 1.2 1.1 1.1  Sodium 136 - 145 mEq/L 142 144 140  Potassium 3.5 - 5.1 mEq/L 4.1 4.1 3.6  Chloride 101 - 111 mmol/L - - -  CO2 22 - 29 mEq/L _0 Calcium 8.4 - 10.4 mg/dL 9.8 9.4 8.8  Total Protein 6.4 - 8.3 g/dL 7.4 6.8 5.5(L)  Total Bilirubin 0.20 - 1.20 mg/dL 1.04 0.43 0.39  Alkaline Phos 40 - 150 U/L 78 89 89  AST 5 - 34 U/L _1 ALT 0 - 55 U/L 15 14 41    CEA (<5.6ng/ml) 06/09/2017: 1.8  07/22/2017: 1.34 10/06/17: 3.00  PATHOLOGY  Diagnosis 06/09/17 Colon, segmental resection for tumor, left - INVASIVE ADENOCARCINOMA, WELL DIFFERENTIATED, SPANNING 5 CM. - TUMOR INVADES THROUGH MUSCULARIS PROPRIA INTO SUBSEROSAL TISSUE. - RESECTION MARGINS ARE NEGATIVE. - METASTATIC CARCINOMA IN TWO OF TWENTY-SEVEN LYMPH NODES (2/27). - SEE ONCOLOGY TABLE. Microscopic Comment COLON AND RECTUM (INCLUDING TRANS-ANAL RESECTION): Specimen: Left colon. Procedure: Segmental  resection. Tumor site: Transverse colon. Specimen integrity: Intact. Macroscopic tumor perforation: Not identified. Invasive tumor: Maximum size: 5 cm Histologic type(s): Invasive adenocarcinoma. Histologic grade and differentiation: G1: well differentiated/low grade Type of polyp in which invasive carcinoma arose: N/A. Microscopic extension of invasive tumor: Tumor invades through muscularis propria into subserosal tissue. Lymph-Vascular invasion: Definitive invasion not identified. Peri-neural invasion: Not identified. Tumor deposit(s) (discontinuous extramural extension): Not identified. Resection margins: Proximal margin: Negative. Distal margin: Negative. Circumferential (radial) (posterior ascending, posterior descending; lateral and posterior mid-rectum; and entire lower 1/3 rectum): Negative. Mesenteric margin (sigmoid and transverse): Negative. Distance closest margin (if all above margins negative): 2 cm (mesenteric). Treatment effect (neo-adjuvant therapy): N/A. Microscopic Comment(continued) Additional polyp(s): None. Non-neoplastic findings: Diverticulosis. Lymph nodes: number examined 27; number positive: 2 Pathologic Staging: pT3, pN1b, pMX Ancillary studies: MSI and MMR will be ordered.   RADIOGRAPHIC STUDIES: I have personally reviewed the radiological images as listed and agreed with the findings in the report. No results found.  ASSESSMENT & PLAN:  TERREN JANDREAU is a 71 y.o. male who has a history of arthritis.   1. Left colon cancer, invasive adenocarcinoma, G1, pT3N1bM0, stage IIIB, MSI-stable  -We reviewed his colon cancer and pathology results. it's stage IIIB with 2 out of 27 positive lymph nodes.  -staging CT abd/pel  Was negative for metastatic disease. -His CT chest was negative for metastasis, I reviewed the results with patient -His tumor was resected completely and he is cancer free now. However, he does  have  a high risk of recurrence due to his  locally advanced disease.  -we discussed adjuvant chemo is recommend and standard care for stage III colon cancer. So I strongly suggest he consider adjuvant chemotherapy to reduce his risk of recurrence.  -We reviewed his chemo options, including single agent Xeloda or 5-FU for 6 months, or more standard option of iv chemo Oxaliplatin and 5-fu Pump (FOLFOX) every 2 weeks for 3 months.  -Patient has decided to take Xeloda alone, he declined intravenous chemotherapy. -He has started adjuvant Xeloda 08/05/17, tolerated first cycle very well, except mild palm erythema.  -After cycle 2 he is fatigued with mild palmar-plantar erythema and moderate watery stool.  -For palmar-plantar erythema, I recommended over the counter hydrocortisone cream to hands and feet.  -Reduce to 2000 mg (4 tablets) in AM, 1500 mg (3 tablets) in PM starting with cycle 3 on 10/07/17. He has tolerated reduced dose much better.  -I again discussed continuing chemo for at least 3-4 months, the goal is 6 months. He is very reluctant to have chemo for 6 months, but is willing to complete 2 more cycles.  -He had left over pills, I called in 70 pills for his cycle 4 and will call in his full amount for cycle 5.  -Labs reviewed, mild anemia at 12.5. Overall adequate to start cycle 4 on 10/28/17  -F/u in 3 weeks  -I offered flu shot today, He agreed, I will give high dose flu shot.   2. Nausea, diarrhea and fatigue  -We discussed these side effects are secondary to Xeloda -He has intermittent mild nausea, I have prescribed Zofran PRN for him to try -I recommend imodium with first dose at first episode of loose stool, can take up to 8 per day per package instructions; he will buy today; I prescribed lomotil in case imodium is not enough -I have encouraged him to increase po fluids -His K is low, 3.0 (09/08/17), I added 20 mEq to NS IVF today and have sent prescription for 20 mEq oral K supplement, He will take 2 BID x3 days then 1 daily  for 1 week -I encouraged him to take Zofran 30 minutes before morning and evening meals to optimize anti-nausea effect and maximize meal time nutrition. -Diarrhea currently controlled.  3. osteoarthritis  -f/u and monitoring   4. malnutrition, weight loss -he has lost 12 pounds in 3 weeks, related to decreased po intake, nausea, mucositis, and diarrhea secondary to Xeloda -f/u with dietician  -He drinks 3-4 boost per day and will continue.  5. AKI and hypokalemia  -Cr went from 1.1 on 07/22/17 to 1.7 on 09/08/2017, likely secondary to dehydration from diarrhea and adjuvant chemotherapy -Was given IV fluids with KCL  -oral KCL called in previously, he will continue oral potassium. I have encouraged him to use these to maximum doses for optimal benefit -I strongly encouraged him to drink more fluids -Potassium increased to 3.6. He will continue potassium pill once every other day.  -Liver enzymes and potassium have normalized as of 10/06/17    PLAN:  -Labs reviewed and adequate to start cycle 4 on 10/28/17 -Flu shot today, high dose  -Lab and f/u in 3 weeks, plan for a total of 5 cycles     No orders of the defined types were placed in this encounter.   All questions were answered. The patient knows to call the clinic with any problems, questions or concerns. I spent 20 minutes counseling the patient face to face.  The total time spent in the appointment was 25 minutes and more than 50% was on counseling.      James Merle, MD 10/27/2017    This document serves as a record of services personally performed by James Merle, MD. It was created on her behalf by Joslyn Devon, a trained medical scribe. The creation of this record is based on the scribe's personal observations and the provider's statements to them.    I have reviewed the above documentation for accuracy and completeness, and I agree with the above.

## 2017-10-26 ENCOUNTER — Telehealth: Payer: Self-pay

## 2017-10-26 NOTE — Telephone Encounter (Signed)
James Doyle at [facility] called requesting Xeloda script reflecting the most recent changes made. He takes 4 in am and 3 in pm at present. S/w Dr Burr Medico before sending script. She sees pt in AM and will rewrite script at that time. S/w James Doyle to be looking for rx in the morning. She stated the pt likes to pick up rx right after visit b/c he lives far away.

## 2017-10-27 ENCOUNTER — Encounter: Payer: Self-pay | Admitting: Hematology

## 2017-10-27 ENCOUNTER — Other Ambulatory Visit (HOSPITAL_BASED_OUTPATIENT_CLINIC_OR_DEPARTMENT_OTHER): Payer: Medicare Other

## 2017-10-27 ENCOUNTER — Ambulatory Visit (HOSPITAL_BASED_OUTPATIENT_CLINIC_OR_DEPARTMENT_OTHER): Payer: Medicare Other | Admitting: Hematology

## 2017-10-27 ENCOUNTER — Telehealth: Payer: Self-pay | Admitting: Hematology

## 2017-10-27 ENCOUNTER — Other Ambulatory Visit: Payer: Self-pay | Admitting: *Deleted

## 2017-10-27 VITALS — BP 160/89 | HR 63 | Temp 98.8°F | Resp 18 | Ht 73.0 in | Wt 241.8 lb

## 2017-10-27 DIAGNOSIS — Z23 Encounter for immunization: Secondary | ICD-10-CM | POA: Diagnosis not present

## 2017-10-27 DIAGNOSIS — C186 Malignant neoplasm of descending colon: Secondary | ICD-10-CM

## 2017-10-27 DIAGNOSIS — R110 Nausea: Secondary | ICD-10-CM

## 2017-10-27 DIAGNOSIS — L538 Other specified erythematous conditions: Secondary | ICD-10-CM

## 2017-10-27 LAB — CBC WITH DIFFERENTIAL/PLATELET
BASO%: 0.4 % (ref 0.0–2.0)
BASOS ABS: 0 10*3/uL (ref 0.0–0.1)
EOS%: 3.5 % (ref 0.0–7.0)
Eosinophils Absolute: 0.3 10*3/uL (ref 0.0–0.5)
HCT: 38.9 % (ref 38.4–49.9)
HGB: 12.5 g/dL — ABNORMAL LOW (ref 13.0–17.1)
LYMPH%: 24.3 % (ref 14.0–49.0)
MCH: 28 pg (ref 27.2–33.4)
MCHC: 32.2 g/dL (ref 32.0–36.0)
MCV: 86.9 fL (ref 79.3–98.0)
MONO#: 0.9 10*3/uL (ref 0.1–0.9)
MONO%: 11 % (ref 0.0–14.0)
NEUT#: 5.1 10*3/uL (ref 1.5–6.5)
NEUT%: 60.8 % (ref 39.0–75.0)
Platelets: 212 10*3/uL (ref 140–400)
RBC: 4.48 10*6/uL (ref 4.20–5.82)
RDW: 28.2 % — ABNORMAL HIGH (ref 11.0–14.6)
WBC: 8.5 10*3/uL (ref 4.0–10.3)
lymph#: 2.1 10*3/uL (ref 0.9–3.3)

## 2017-10-27 LAB — COMPREHENSIVE METABOLIC PANEL
ALT: 15 U/L (ref 0–55)
AST: 22 U/L (ref 5–34)
Albumin: 4.4 g/dL (ref 3.5–5.0)
Alkaline Phosphatase: 78 U/L (ref 40–150)
Anion Gap: 8 mEq/L (ref 3–11)
BILIRUBIN TOTAL: 1.04 mg/dL (ref 0.20–1.20)
BUN: 13 mg/dL (ref 7.0–26.0)
CO2: 27 meq/L (ref 22–29)
CREATININE: 1.2 mg/dL (ref 0.7–1.3)
Calcium: 9.8 mg/dL (ref 8.4–10.4)
Chloride: 107 mEq/L (ref 98–109)
EGFR: 58 mL/min/{1.73_m2} — ABNORMAL LOW (ref >60)
GLUCOSE: 94 mg/dL (ref 70–140)
Potassium: 4.1 mEq/L (ref 3.5–5.1)
SODIUM: 142 meq/L (ref 136–145)
TOTAL PROTEIN: 7.4 g/dL (ref 6.4–8.3)

## 2017-10-27 MED ORDER — INFLUENZA VAC SPLIT HIGH-DOSE 0.5 ML IM SUSY
0.5000 mL | PREFILLED_SYRINGE | Freq: Once | INTRAMUSCULAR | Status: AC
  Administered 2017-10-27: 0.5 mL via INTRAMUSCULAR
  Filled 2017-10-27: qty 0.5

## 2017-10-27 MED ORDER — CAPECITABINE 500 MG PO TABS: ORAL_TABLET | ORAL | 0 refills | Status: DC

## 2017-10-27 MED FILL — CAPECITABINE 500 MG TABLET: 500 | 10 days supply | Qty: 70 | Fill #0

## 2017-10-27 NOTE — Telephone Encounter (Signed)
Gave avs and calendar for December  °

## 2017-11-05 ENCOUNTER — Other Ambulatory Visit: Payer: Self-pay | Admitting: Hematology

## 2017-11-05 DIAGNOSIS — C186 Malignant neoplasm of descending colon: Secondary | ICD-10-CM

## 2017-11-10 ENCOUNTER — Other Ambulatory Visit: Payer: Self-pay | Admitting: *Deleted

## 2017-11-10 DIAGNOSIS — C186 Malignant neoplasm of descending colon: Secondary | ICD-10-CM

## 2017-11-10 MED ORDER — CAPECITABINE 500 MG PO TABS: ORAL_TABLET | ORAL | 0 refills | Status: DC

## 2017-11-11 MED FILL — CAPECITABINE 500 MG TABLET: 500 | 21 days supply | Qty: 98 | Fill #0

## 2017-11-15 NOTE — Progress Notes (Signed)
James Doyle  [phone] [phone]  Clinic Follow Up Note   Patient Care Team: Alroy Dust, L.Marlou Sa, MD as PCP - General (Family Medicine) Leighton Ruff, MD as Consulting Physician (General Surgery) Truitt Merle, MD as Consulting Physician (Hematology) Wonda Horner, MD as Consulting Physician (Gastroenterology)   Date of Service:  11/17/2017   CHIEF COMPLAINTS:  Follow Up for Colon Cancer  Oncology History   Cancer Staging Cancer of left colon [facility]) Staging form: Colon and Rectum, AJCC 8th Edition - Pathologic stage from 06/09/2017: Stage IIIB (pT3, pN1b, cM0) - Signed by Truitt Merle, MD on 07/06/2017       Cancer of left colon ([place])   06/08/2017 Initial Diagnosis    Cancer of left colon ([place])      06/08/2017 Imaging    CT AP W Contrast 06/08/17 IMPRESSION: 1. Mass of the transverse colon near splenic flexure measuring up to 4 cm compatible with colonic adenocarcinoma. No lymphadenopathy by imaging criteria. 2. Proximal dilatation of ascending and transverse colon with mild inflammatory changes likely due to obstruction. 3. 4 mm right middle lobe pulmonary nodule, attention on follow-up recommended. 4. 2 mm right kidney nonobstructing nephrolithiasis.      06/09/2017 Surgery    LAPAROSCOPY DIAGNOSTIC, OPEN LEFT COLECTOMY by Dr. Marcello Moores      06/09/2017 Pathology Results    Diagnosis 06/09/17 Colon, segmental resection for tumor, left - INVASIVE ADENOCARCINOMA, WELL DIFFERENTIATED, SPANNING 5 CM. - TUMOR INVADES THROUGH MUSCULARIS PROPRIA INTO SUBSEROSAL TISSUE. - RESECTION MARGINS ARE NEGATIVE. - METASTATIC CARCINOMA IN TWO OF TWENTY-SEVEN LYMPH NODES (2/27). - SEE ONCOLOGY TABLE.       07/20/2017 Imaging    CT Chest W Contrast IMPRESSION: No evidence of metastatic disease or other acute findings within the thorax.      08/05/2017 -  Chemotherapy    Xeloda 2542m in am and 20039mpm, 2 weeks on and 1 week off, starting on 08/05/17  and plan for 6 months   Reduced to 2000 mg (4 tablets) in AM, 1500 mg (3 tablets) in PM with cycle 3, cycle 3 has been held since 08/1017 due to GI toxicity and active infection.         HISTORY OF PRESENTING ILLNESS: 07/07/17 James Chroman71.o. male is here because of newly diagnosed cancer of left colon. He presents to the clinic with his wife, daughter and granddaughter. Referred by Dr. ThMarcello MooresHe was diagnosed with screening colonoscopy by Dr. AnChancy Milroyt [place] was due for his colonoscopy: Before that He had intermittent sever stomach pain, and reported abdominal bloating and swelling. He also had some constipation. He developed bowel obstruction after the colonoscopy and was admitted to hospital. He underwent left segmental colon resection by Dr. ThMarcello Mooresn 06/09/2017.   today he feels fine since surgery. His incision is healing and stoma is well. His appetite is back to normal overall. Sometimes he will lower his intake when he afraid his bag will get too full  His bowel will vary from loose to formed. It's mostly is pasty. No blood in stool since surgery. he empties 2-3 times a day. He denies fever or chills. His activity is better but he still takes a nap when needed.  He plays music/string instruments. He is not to pick up on anything that could strain his stomach. He has tingling in his right hand fingers slightly since surgery. 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 He is somewhat active and tries to walk. His family wanted to clear up the side of which his tumor was on and that delayed his surgery.   He plays music around town several times a week. wife wants to know will he get as much hep from pill as with IV chemo.  His family shares the questions with his chemo options.   Past, for his knee replacement he was on blood thinner and he noticed blood in stool after surgery. He has not been diagnosed with any other  significant issues. He takes tylenol when needed. He has arthritis in his hands. Ear surgery due to ear drum burst at 71. 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 His maternal uncle had colon cancer.  CURRENT THERAPY:  Xeloda 2556m in am and 20064mpm, 2 weeks on and 1 week off, started on 08/05/2017. Reduced to 2000 mg (4 tablets) in AM, 1500 mg (3 tablets) in PM with cycle 3, cycle 3 has been held since 08/1017 due to GI toxicity and active infection. Start reduced cycle 3 on 10/07/17. Plan to complete around 12/09/16.  INTERVAL HISTORY:  James SwDeluciaresents to the clinic for follow up. He presented to the clinic today with is wife and daughter.  He notes he has no cracks on his hands. He notes his fingers feel off but no major change. He notes his moles have been falling off. He starts his next cycle tomorrow. He plans to get handicap sticker from PCP this week. He notes when he has diarrhea his bag will swell. He has been taking 2 imodium daily. This has solidified his stool.      MEDICAL HISTORY:  Past Medical History:  Diagnosis Date  . Arthritis   . H/O vocal cord paralysis    left     SURGICAL HISTORY: Past Surgical History:  Procedure Laterality Date  . BACK SURGERY     times 2  . INNER EAR SURGERY     bilat  . LAPAROSCOPY N/A 06/09/2017   Procedure: LAPAROSCOPY DIAGNOSTIC, OPEN  LEFT COLECTOMY;  Surgeon: ThLeighton RuffMD;  Location: [facility];  Service: General;  Laterality: N/A;  . PROSTATE BIOPSY    . TONSILLECTOMY    . TOTAL KNEE ARTHROPLASTY Right 07/27/2016   Procedure: RIGHT TOTAL KNEE ARTHROPLASTY;  Surgeon: FrGaynelle ArabianMD;  Location: [facility];  Service: Orthopedics;  Laterality: Right;    SOCIAL HISTORY: Social History   Socioeconomic History  . Marital status: Married    Spouse name: Not on file  . Number of children: Not on file  . Years of education: Not on file  . Highest education level: Not on file  Social Needs  .  Financial resource strain: Not on file  . Food insecurity - worry: Not on file  . Food insecurity - inability: Not on file  . Transportation needs - medical: Not on file  . Transportation needs - non-medical: Not on file  Occupational History  . Not on file  Tobacco Use  . Smoking status: Never Smoker  . Smokeless tobacco: Never Used  Substance and Sexual Activity  . Alcohol use: No  . Drug use: No  . Sexual activity: Not on file  Other Topics Concern  . Not on file  Social History Narrative  . Not on file    FAMILY HISTORY: Family History  Problem Relation Age of Onset  . Heart failure  Mother   . Stroke Father   . Cancer Maternal Uncle        colon cancer    ALLERGIES:  has No Known Allergies.  MEDICATIONS:  Current Outpatient Medications  Medication Sig Dispense Refill  . acetaminophen (TYLENOL) 500 MG tablet Take 2 tablets (1,000 mg total) by mouth every 8 (eight) hours as needed. 30 tablet 0  . diphenoxylate-atropine (LOMOTIL) 2.5-0.025 MG tablet Take 1 tablet by mouth 4 (four) times daily as needed for diarrhea or loose stools. 40 tablet 0  . ibuprofen (ADVIL,MOTRIN) 200 MG tablet Take 200 mg by mouth every 6 (six) hours as needed.    . urea (CARMOL) 10 % cream Apply topically as needed. 71 g 0  . capecitabine (XELODA) 500 MG tablet TAKE 4 TABS ( 2000 MG ) IN  AM & 3 TABS ( 1500 MG ) IN  PM FOR 14 DAYS ON,  OFF 7 DAYS. (Patient not taking: Reported on 11/17/2017) 98 tablet 0  . docusate sodium (COLACE) 100 MG capsule Take 200 mg by mouth 2 (two) times daily as needed for mild constipation.    . ondansetron (ZOFRAN) 8 MG tablet Take 1 tablet (8 mg total) by mouth every 8 (eight) hours as needed for nausea or vomiting. (Patient not taking: Reported on 10/27/2017) 20 tablet 1  . polyethylene glycol (MIRALAX / GLYCOLAX) packet Take 17 g by mouth daily as needed for mild constipation.    . potassium chloride SA (K-DUR,KLOR-CON) 20 MEQ tablet Take 1 tablet (20 mEq total) by  mouth once. 14 tablet 1   No current facility-administered medications for this visit.     REVIEW OF SYSTEMS:   Constitutional: Denies fevers, chills or abnormal night sweats Eyes: Denies blurriness of vision, double vision or watery eyes Ears, nose, mouth, throat, and face: Denies mucositis or sore throat Respiratory: Denies cough, dyspnea or wheezes Cardiovascular: Denies palpitation, chest discomfort or lower extremity swelling Gastrointestinal:  Denies nausea, heartburn or change in bowel habits (+) mild diarrhea  Skin: Denies abnormal skin rashes (+) dry, and red hands (+) dry skin on feet (+) moles coming off Lymphatics: Denies new lymphadenopathy or easy bruising Neurological:Denies numbness, new weaknesses (+) tingling on right hands fingers, decrease in gripping  Behavioral/Psych: Mood is stable, no new changes  All other systems were reviewed with the patient and are negative.  PHYSICAL EXAMINATION:  ECOG PERFORMANCE STATUS: 1 - Symptomatic but completely ambulatory   BP 129/71 (BP Location: Left Arm, Patient Position: Sitting)   Pulse 62   Temp 98.1 F (36.7 C) (Oral)   Resp 20   Ht 6' 1"  (1.854 m)   Wt 243 lb 4.8 oz (110.4 kg)   SpO2 100%   BMI 32.10 kg/m    GENERAL:alert, no distress and comfortable SKIN: skin color, texture, turgor are normal, no rashes or significant lesions, (+) mild diffuse erythema on his palms, no skin peeling or cracks, much improved (+) dry and peeling skin on feet EYES: normal, conjunctiva are pink and non-injected, sclera clear OROPHARYNX:no exudate, no erythema and lips, buccal mucosa, and tongue normal  NECK: supple, thyroid normal size, non-tender, without nodularity LYMPH:  no palpable lymphadenopathy in the cervical, axillary or inguinal LUNGS: clear to auscultation and percussion with normal breathing effort HEART: regular rate & rhythm and no murmurs and no lower extremity edema ABDOMEN: abdomen soft, non-tender and normal bowel  sounds, (+) colostomy bag, his midline incision has healed well  Musculoskeletal:no cyanosis of digits and no clubbing  PSYCH: alert & oriented x 3 with fluent speech NEURO: no focal motor/sensory deficits  LABORATORY DATA:  I have reviewed the data as listed CBC Latest Ref Rng & Units 11/17/2017 10/27/2017 10/06/2017  WBC 4.0 - 10.3 10e3/uL 7.5 8.5 7.0  Hemoglobin 13.0 - 17.1 g/dL 12.4(L) 12.5(L) 11.9(L)  Hematocrit 38.4 - 49.9 % 38.2(L) 38.9 37.3(L)  Platelets 140 - 400 10e3/uL 139(L) 212 142    CMP Latest Ref Rng & Units 11/17/2017 10/27/2017 10/06/2017  Glucose 70 - 140 mg/dl 118 94 94  BUN 7.0 - 26.0 mg/dL 17.5 13.0 12.8  Creatinine 0.7 - 1.3 mg/dL 1.3 1.2 1.1  Sodium 136 - 145 mEq/L 142 142 144  Potassium 3.5 - 5.1 mEq/L 4.0 4.1 4.1  Chloride 101 - 111 mmol/L - - -  CO2 22 - 29 mEq/L 28 27 28   Calcium 8.4 - 10.4 mg/dL 9.5 9.8 9.4  Total Protein 6.4 - 8.3 g/dL 6.9 7.4 6.8  Total Bilirubin 0.20 - 1.20 mg/dL 1.05 1.04 0.43  Alkaline Phos 40 - 150 U/L 79 78 89  AST 5 - 34 U/L 23 22 21   ALT 0 - 55 U/L 15 15 14     CEA (<5.6ng/ml) 06/09/2017: 1.8  07/22/2017: 1.34 10/06/17: 3.00  PATHOLOGY  Diagnosis 06/09/17 Colon, segmental resection for tumor, left - INVASIVE ADENOCARCINOMA, WELL DIFFERENTIATED, SPANNING 5 CM. - TUMOR INVADES THROUGH MUSCULARIS PROPRIA INTO SUBSEROSAL TISSUE. - RESECTION MARGINS ARE NEGATIVE. - METASTATIC CARCINOMA IN TWO OF TWENTY-SEVEN LYMPH NODES (2/27). - SEE ONCOLOGY TABLE. Microscopic Comment COLON AND RECTUM (INCLUDING TRANS-ANAL RESECTION): Specimen: Left colon. Procedure: Segmental resection. Tumor site: Transverse colon. Specimen integrity: Intact. Macroscopic tumor perforation: Not identified. Invasive tumor: Maximum size: 5 cm Histologic type(s): Invasive adenocarcinoma. Histologic grade and differentiation: G1: well differentiated/low grade Type of polyp in which invasive carcinoma arose: N/A. Microscopic extension of invasive tumor:  Tumor invades through muscularis propria into subserosal tissue. Lymph-Vascular invasion: Definitive invasion not identified. Peri-neural invasion: Not identified. Tumor deposit(s) (discontinuous extramural extension): Not identified. Resection margins: Proximal margin: Negative. Distal margin: Negative. Circumferential (radial) (posterior ascending, posterior descending; lateral and posterior mid-rectum; and entire lower 1/3 rectum): Negative. Mesenteric margin (sigmoid and transverse): Negative. Distance closest margin (if all above margins negative): 2 cm (mesenteric). Treatment effect (neo-adjuvant therapy): N/A. Microscopic Comment(continued) Additional polyp(s): None. Non-neoplastic findings: Diverticulosis. Lymph nodes: number examined 27; number positive: 2 Pathologic Staging: pT3, pN1b, pMX Ancillary studies: MSI and MMR will be ordered.   RADIOGRAPHIC STUDIES: I have personally reviewed the radiological images as listed and agreed with the findings in the report. No results found.  ASSESSMENT & PLAN:  James Doyle is a 71 y.o. male who has a history of arthritis.   1. Left colon cancer, invasive adenocarcinoma, G1, pT3N1bM0, stage IIIB, MSI-stable  -We reviewed his colon cancer and pathology results. it's stage IIIB with 2 out of 27 positive lymph nodes.  -staging CT abd/pel  Was negative for metastatic disease. -His CT chest was negative for metastasis, I reviewed the results with patient -His tumor was resected completely and he is cancer free now. However, he does  have a high risk of recurrence due to his locally advanced disease.  -we discussed adjuvant chemo is recommend and standard care for stage III colon cancer. So I strongly suggest he consider adjuvant chemotherapy to reduce his risk of recurrence.  -We reviewed his chemo options, including single agent Xeloda or 5-FU for 6 months, or more standard option of iv chemo Oxaliplatin and 5-fu  Pump (FOLFOX) every 2  weeks for 3 months.  -Patient has decided to take Xeloda alone, he declined intravenous chemotherapy. -He has started adjuvant Xeloda 08/05/17, tolerated first cycle very well, except mild palm erythema.  -After cycle 2 he is fatigued with mild palmar-plantar erythema and moderate watery stool.  -For palmar-plantar erythema, I recommended over the counter hydrocortisone cream to hands and feet.  -Reduced to 2000 mg (4 tablets) in AM, 1500 mg (3 tablets) in PM starting with cycle 3 on 10/07/17. He has tolerated reduced dose much better.  -I again discussed continuing chemo for at least 3-4 months, the goal is 6 months. He is very reluctant to have chemo for 6 months, but is willing to complete 2 more cycles.  -I discussed with his modified treatment he is still at moderate risk of 30% risk of recurrence.  -Labs reviewed, mild anemia, and EGFR at 54, overall adequate to proceed with treatment.  -He will proceed with last cycle Xeloda tomorrow. I will send a message to Dr. Marcello Moores about proceeding with surgery.  -F/u in 6 weeks with surveillance CT scan    2. Nausea, diarrhea and fatigue  -We discussed these side effects are secondary to Xeloda -He has intermittent mild nausea, I have prescribed Zofran PRN for him to try -I recommend imodium with first dose at first episode of loose stool, can take up to 8 per day per package instructions; he will buy today; I prescribed lomotil in case imodium is not enough -I have encouraged him to increase po fluids -His K is low, 3.0 (09/08/17), I added 20 mEq to NS IVF today and have sent prescription for 20 mEq oral K supplement, He will take 2 BID x3 days then 1 daily for 1 week -I encouraged him to take Zofran 30 minutes before morning and evening meals to optimize anti-nausea effect and maximize meal time nutrition. -Diarrhea, nausea currently controlled.  3. osteoarthritis  -f/u and monitoring   4. malnutrition, weight loss -he has lost 12 pounds in 3  weeks, related to decreased po intake, nausea, mucositis, and diarrhea secondary to Xeloda -f/u with dietician  -He drinks 3-4 boost per day and will continue.  5. AKI and hypokalemia  -Cr went from 1.1 on 07/22/17 to 1.7 on 09/08/2017, likely secondary to dehydration from diarrhea and adjuvant chemotherapy -Was given IV fluids with KCL  -oral KCL called in previously, he will continue oral potassium. I have encouraged him to use these to maximum doses for optimal benefit -I strongly encouraged him to drink more fluids -Potassium increased to 3.6. He will continue potassium pill once every other day.  -Liver enzymes and potassium have normalized as of 10/06/17    PLAN:  -Labs reviewed and adequate to start last cycle Xeloda on 11/17/17 -F/u in 6 weeks -Lab and CT AP W contrast a few days before.  -Send message to Dr. Marcello Moores about proceeding with surgery    Orders Placed This Encounter  Procedures  . CT Abdomen Pelvis W Contrast    Standing Status:   Future    Standing Expiration Date:   11/17/2018    Order Specific Question:   If indicated for the ordered procedure, I authorize the administration of contrast media per Radiology protocol    Answer:   Yes    Order Specific Question:   Preferred imaging location?    Answer:   [facility]    Order Specific Question:   Radiology Contrast Protocol - do NOT  remove file path    Answer:   [email or web address]    All questions were answered. The patient knows to call the clinic with any problems, questions or concerns. I spent 20 minutes counseling the patient face to face. The total time spent in the appointment was 25 minutes and more than 50% was on counseling.      Truitt Merle, MD 11/17/2017    This document serves as a record of services personally performed by Truitt Merle, MD. It was created on her behalf by Joslyn Devon, a trained medical scribe. The creation of this record is based on the  scribe's personal observations and the provider's statements to them.    I have reviewed the above documentation for accuracy and completeness, and I agree with the above.

## 2017-11-17 ENCOUNTER — Encounter: Payer: Self-pay | Admitting: Hematology

## 2017-11-17 ENCOUNTER — Ambulatory Visit (HOSPITAL_BASED_OUTPATIENT_CLINIC_OR_DEPARTMENT_OTHER): Payer: Medicare Other | Admitting: Hematology

## 2017-11-17 ENCOUNTER — Other Ambulatory Visit (HOSPITAL_BASED_OUTPATIENT_CLINIC_OR_DEPARTMENT_OTHER): Payer: Medicare Other

## 2017-11-17 ENCOUNTER — Other Ambulatory Visit: Payer: Medicare Other

## 2017-11-17 ENCOUNTER — Telehealth: Payer: Self-pay | Admitting: Hematology

## 2017-11-17 VITALS — BP 129/71 | HR 62 | Temp 98.1°F | Resp 20 | Ht 73.0 in | Wt 243.3 lb

## 2017-11-17 DIAGNOSIS — C186 Malignant neoplasm of descending colon: Secondary | ICD-10-CM | POA: Diagnosis not present

## 2017-11-17 LAB — COMPREHENSIVE METABOLIC PANEL
ALBUMIN: 4.3 g/dL (ref 3.5–5.0)
ALK PHOS: 79 U/L (ref 40–150)
ALT: 15 U/L (ref 0–55)
AST: 23 U/L (ref 5–34)
Anion Gap: 8 mEq/L (ref 3–11)
BUN: 17.5 mg/dL (ref 7.0–26.0)
CO2: 28 meq/L (ref 22–29)
Calcium: 9.5 mg/dL (ref 8.4–10.4)
Chloride: 106 mEq/L (ref 98–109)
Creatinine: 1.3 mg/dL (ref 0.7–1.3)
EGFR: 54 mL/min/{1.73_m2} — AB (ref >60)
GLUCOSE: 118 mg/dL (ref 70–140)
POTASSIUM: 4 meq/L (ref 3.5–5.1)
SODIUM: 142 meq/L (ref 136–145)
TOTAL PROTEIN: 6.9 g/dL (ref 6.4–8.3)
Total Bilirubin: 1.05 mg/dL (ref 0.20–1.20)

## 2017-11-17 LAB — CBC WITH DIFFERENTIAL/PLATELET
BASO%: 0.3 % (ref 0.0–2.0)
BASOS ABS: 0 10*3/uL (ref 0.0–0.1)
EOS%: 3.5 % (ref 0.0–7.0)
Eosinophils Absolute: 0.3 10*3/uL (ref 0.0–0.5)
HEMATOCRIT: 38.2 % — AB (ref 38.4–49.9)
HEMOGLOBIN: 12.4 g/dL — AB (ref 13.0–17.1)
LYMPH#: 1.7 10*3/uL (ref 0.9–3.3)
LYMPH%: 23.2 % (ref 14.0–49.0)
MCH: 29.5 pg (ref 27.2–33.4)
MCHC: 32.5 g/dL (ref 32.0–36.0)
MCV: 90.7 fL (ref 79.3–98.0)
MONO#: 1.2 10*3/uL — AB (ref 0.1–0.9)
MONO%: 15.7 % — ABNORMAL HIGH (ref 0.0–14.0)
NEUT#: 4.3 10*3/uL (ref 1.5–6.5)
NEUT%: 57.3 % (ref 39.0–75.0)
PLATELETS: 139 10*3/uL — AB (ref 140–400)
RBC: 4.21 10*6/uL (ref 4.20–5.82)
RDW: 22.4 % — ABNORMAL HIGH (ref 11.0–14.6)
WBC: 7.5 10*3/uL (ref 4.0–10.3)
nRBC: 0 % (ref 0–0)

## 2017-11-17 NOTE — Telephone Encounter (Signed)
Scheduled appt per 12/19 los - Gave patient AVS and calender per [facility] radiology to contact patient with ct schedule.

## 2017-12-27 ENCOUNTER — Ambulatory Visit (HOSPITAL_COMMUNITY)
Admission: RE | Admit: 2017-12-27 | Discharge: 2017-12-27 | Disposition: A | Discharge status: Home / Self Care | Payer: Medicare Other | Source: Ambulatory Visit | Attending: Hematology | Admitting: Hematology

## 2017-12-27 ENCOUNTER — Inpatient Hospital Stay: Payer: Medicare Other | Attending: Hematology

## 2017-12-27 ENCOUNTER — Encounter (HOSPITAL_COMMUNITY): Payer: Self-pay

## 2017-12-27 DIAGNOSIS — N2889 Other specified disorders of kidney and ureter: Secondary | ICD-10-CM | POA: Diagnosis not present

## 2017-12-27 DIAGNOSIS — N200 Calculus of kidney: Secondary | ICD-10-CM

## 2017-12-27 DIAGNOSIS — C186 Malignant neoplasm of descending colon: Secondary | ICD-10-CM | POA: Insufficient documentation

## 2017-12-27 DIAGNOSIS — K7689 Other specified diseases of liver: Secondary | ICD-10-CM | POA: Diagnosis not present

## 2017-12-27 DIAGNOSIS — Z933 Colostomy status: Secondary | ICD-10-CM | POA: Diagnosis not present

## 2017-12-27 DIAGNOSIS — I700 Atherosclerosis of aorta: Secondary | ICD-10-CM

## 2017-12-27 DIAGNOSIS — G629 Polyneuropathy, unspecified: Secondary | ICD-10-CM | POA: Insufficient documentation

## 2017-12-27 HISTORY — DX: Calculus of kidney: N20.0

## 2017-12-27 HISTORY — DX: Atherosclerosis of aorta: I70.0

## 2017-12-27 LAB — CBC WITH DIFFERENTIAL/PLATELET
Basophils Absolute: 0 10*3/uL (ref 0.0–0.1)
Basophils Relative: 0 %
Eosinophils Absolute: 0.1 10*3/uL (ref 0.0–0.5)
Eosinophils Relative: 1 %
HEMATOCRIT: 40.7 % (ref 38.4–49.9)
HEMOGLOBIN: 13.6 g/dL (ref 13.0–17.1)
LYMPHS ABS: 1.7 10*3/uL (ref 0.9–3.3)
Lymphocytes Relative: 20 %
MCH: 30.6 pg (ref 27.2–33.4)
MCHC: 33.4 g/dL (ref 32.0–36.0)
MCV: 91.7 fL (ref 79.3–98.0)
MONOS PCT: 9 %
Monocytes Absolute: 0.7 10*3/uL (ref 0.1–0.9)
NEUTROS ABS: 5.9 10*3/uL (ref 1.5–6.5)
NEUTROS PCT: 70 %
Platelets: 102 10*3/uL — ABNORMAL LOW (ref 140–400)
RBC: 4.44 MIL/uL (ref 4.20–5.82)
RDW: 18.1 % — ABNORMAL HIGH (ref 11.0–15.6)
WBC: 8.4 10*3/uL (ref 4.0–10.3)

## 2017-12-27 LAB — COMPREHENSIVE METABOLIC PANEL
ALT: 21 U/L (ref 0–55)
ANION GAP: 9 (ref 3–11)
AST: 25 U/L (ref 5–34)
Albumin: 4.1 g/dL (ref 3.5–5.0)
Alkaline Phosphatase: 83 U/L (ref 40–150)
BUN: 14 mg/dL (ref 7–26)
CHLORIDE: 107 mmol/L (ref 98–109)
CO2: 26 mmol/L (ref 22–29)
Calcium: 9.1 mg/dL (ref 8.4–10.4)
Creatinine, Ser: 1.16 mg/dL (ref 0.70–1.30)
GFR calc non Af Amer: >60 mL/min (ref >60)
Glucose, Bld: 141 mg/dL — ABNORMAL HIGH (ref 70–140)
Potassium: 3.5 mmol/L (ref 3.5–5.1)
SODIUM: 142 mmol/L (ref 136–145)
Total Bilirubin: 0.7 mg/dL (ref 0.2–1.2)
Total Protein: 7.1 g/dL (ref 6.4–8.3)

## 2017-12-27 MED ORDER — IOPAMIDOL (ISOVUE-300) INJECTION 61%
100.0000 mL | Freq: Once | INTRAVENOUS | Status: AC | PRN
  Administered 2017-12-27: 100 mL via INTRAVENOUS

## 2017-12-27 MED ORDER — IOPAMIDOL (ISOVUE-300) INJECTION 61%
INTRAVENOUS | Status: AC
  Filled 2017-12-27: qty 100

## 2017-12-27 NOTE — Progress Notes (Signed)
James Doyle  [phone] [phone]  Clinic Follow Up Note   Patient Care Team: Alroy Dust, L.Marlou Sa, MD as PCP - General (Family Medicine) Leighton Ruff, MD as Consulting Physician (General Surgery) Truitt Merle, MD as Consulting Physician (Hematology) Wonda Horner, MD as Consulting Physician (Gastroenterology)   Date of Service:  12/29/2017   CHIEF COMPLAINTS:  Follow Up for Colon Cancer  Oncology History   Cancer Staging Cancer of left colon [facility]) Staging form: Colon and Rectum, AJCC 8th Edition - Pathologic stage from 06/09/2017: Stage IIIB (pT3, pN1b, cM0) - Signed by Truitt Merle, MD on 07/06/2017       Cancer of left colon ([place])   06/08/2017 Initial Diagnosis    Cancer of left colon ([place])      06/08/2017 Imaging    CT AP W Contrast 06/08/17 IMPRESSION: 1. Mass of the transverse colon near splenic flexure measuring up to 4 cm compatible with colonic adenocarcinoma. No lymphadenopathy by imaging criteria. 2. Proximal dilatation of ascending and transverse colon with mild inflammatory changes likely due to obstruction. 3. 4 mm right middle lobe pulmonary nodule, attention on follow-up recommended. 4. 2 mm right kidney nonobstructing nephrolithiasis.      06/09/2017 Surgery    LAPAROSCOPY DIAGNOSTIC, OPEN LEFT COLECTOMY by Dr. Marcello Moores      06/09/2017 Pathology Results    Diagnosis 06/09/17 Colon, segmental resection for tumor, left - INVASIVE ADENOCARCINOMA, WELL DIFFERENTIATED, SPANNING 5 CM. - TUMOR INVADES THROUGH MUSCULARIS PROPRIA INTO SUBSEROSAL TISSUE. - RESECTION MARGINS ARE NEGATIVE. - METASTATIC CARCINOMA IN TWO OF TWENTY-SEVEN LYMPH NODES (2/27). - SEE ONCOLOGY TABLE.       07/20/2017 Imaging    CT Chest W Contrast IMPRESSION: No evidence of metastatic disease or other acute findings within the thorax.      08/05/2017 - 12/09/2017 Chemotherapy    Xeloda 2549m in am and 20075mpm, 2 weeks on and 1 week off, starting on  08/05/17 and plan for 6 months   Reduced to 2000 mg (4 tablets) in AM, 1500 mg (3 tablets) in PM with cycle 3, cycle 3 has been held since 08/1017 due to GI toxicity and active infection.       12/27/2017 Imaging    CT AP W Contrast 12/27/17 IMPRESSION: 1. Interval partial colectomy with transverse colostomy and long Hartmann's pouch. 2. No evidence of recurrent or metastatic disease. 3. Too small to characterize left hepatic lobe lesion, similar to on the prior exam. Ongoing stability favors a benign lesion such as a tiny cyst. This could be re-evaluated at follow-up. 4. Left renal lesion which measures greater than fluid density but is decreased in size compared to the prior. This favors a complex cyst but is technically indeterminate. Recommend attention on follow-up. 5.  Aortic Atherosclerosis (ICD10-I70.0). 6. Right nephrolithiasis.        HISTORY OF PRESENTING ILLNESS: 07/07/17 [name]72o. male is here because of newly diagnosed cancer of left colon. He presents to the clinic with his wife, daughter and granddaughter. Referred by Dr. ThMarcello MooresHe was diagnosed with screening colonoscopy by Dr. AnChancy Milroyt [place] was due for his colonoscopy: Before that He had intermittent sever stomach pain, and reported abdominal bloating and swelling. He also had some constipation. He developed bowel obstruction after the colonoscopy and was admitted to hospital. He underwent left segmental colon resection by Dr. ThMarcello Mooresn 06/09/2017.   today he feels fine since surgery. His incision is healing and stoma is  well. His appetite is back to normal overall. Sometimes he will lower his intake when he afraid his bag will get too full  His bowel will vary from loose to formed. It's mostly is pasty. No blood in stool since surgery. he empties 2-3 times a day. He denies fever or chills. His activity is better but he still takes a nap when needed.  He plays music/string instruments. He is not to pick  up on anything that could strain his stomach. He has tingling in his right hand fingers slightly since surgery. He is now retired and previous work in Office manager and a Writer.  He is HOH and wears hearing Aid. He is somewhat active and tries to walk. His family wanted to clear up the side of which his tumor was on and that delayed his surgery.   He plays music around town several times a week. wife wants to know will he get as much hep from pill as with IV chemo.  His family shares the questions with his chemo options.   Past, for his knee replacement he was on blood thinner and he noticed blood in stool after surgery. He has not been diagnosed with any other significant issues. He takes tylenol when needed. He has arthritis in his hands. Ear surgery due to ear drum burst at 16. 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 His maternal uncle had colon cancer.  CURRENT THERAPY:  Surveillance  INTERVAL HISTORY:  James Doyle presents to the clinic for follow up. He presented to the clinic today with is wife, daughter, and grandson. He reports moderate diarrhea secondary to the IV contrast. He has taken medication and it has resolved now. He notes he also felt SOB the day of the IV contrast. He reports to still have redness on his hands but there is no cracking. 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 He is able to hold things normally. He does not feel his energy level has returned to normal. He will follow up with his surgeon next week.   On review of systems, pt denies rash, or any other complaints at this time. Pertinent positives are listed and detailed within the above HPI.   MEDICAL HISTORY:  Past Medical History:  Diagnosis Date  . Arthritis   . H/O vocal cord paralysis    left     SURGICAL HISTORY: Past Surgical History:  Procedure Laterality Date  . BACK SURGERY     times 2  . INNER EAR SURGERY     bilat  . LAPAROSCOPY  N/A 06/09/2017   Procedure: LAPAROSCOPY DIAGNOSTIC, OPEN  LEFT COLECTOMY;  Surgeon: Leighton Ruff, MD;  Location: [facility];  Service: General;  Laterality: N/A;  . PROSTATE BIOPSY    . TONSILLECTOMY    . TOTAL KNEE ARTHROPLASTY Right 07/27/2016   Procedure: RIGHT TOTAL KNEE ARTHROPLASTY;  Surgeon: Gaynelle Arabian, MD;  Location: [facility];  Service: Orthopedics;  Laterality: Right;    SOCIAL HISTORY: Social History   Socioeconomic History  . Marital status: Married    Spouse name: Not on file  . Number of children: Not on file  . Years of education: Not on file  . Highest education level: Not on file  Social Needs  . Financial resource strain: Not on file  . Food insecurity - worry: Not on file  . Food insecurity - inability: Not on file  . Transportation needs - medical: Not  on file  . Transportation needs - non-medical: Not on file  Occupational History  . Not on file  Tobacco Use  . Smoking status: Never Smoker  . Smokeless tobacco: Never Used  Substance and Sexual Activity  . Alcohol use: No  . Drug use: No  . Sexual activity: Not on file  Other Topics Concern  . Not on file  Social History Narrative  . Not on file    FAMILY HISTORY: Family History  Problem Relation Age of Onset  . Heart failure Mother   . Stroke Father   . Cancer Maternal Uncle        colon cancer    ALLERGIES:  has No Known Allergies.  MEDICATIONS:  Current Outpatient Medications  Medication Sig Dispense Refill  . acetaminophen (TYLENOL) 500 MG tablet Take 2 tablets (1,000 mg total) by mouth every 8 (eight) hours as needed. 30 tablet 0  . diphenoxylate-atropine (LOMOTIL) 2.5-0.025 MG tablet Take 1 tablet by mouth 4 (four) times daily as needed for diarrhea or loose stools. 30 tablet 0  . docusate sodium (COLACE) 100 MG capsule Take 200 mg by mouth 2 (two) times daily as needed for mild constipation.    Marland Kitchen ibuprofen (ADVIL,MOTRIN) 200 MG tablet Take 200 mg by mouth every 6 (six) hours as needed.      . ondansetron (ZOFRAN) 8 MG tablet Take 1 tablet (8 mg total) by mouth every 8 (eight) hours as needed for nausea or vomiting. (Patient not taking: Reported on 10/27/2017) 20 tablet 1  . polyethylene glycol (MIRALAX / GLYCOLAX) packet Take 17 g by mouth daily as needed for mild constipation.    . urea (CARMOL) 10 % cream Apply topically as needed. 71 g 0   No current facility-administered medications for this visit.     REVIEW OF SYSTEMS:   Constitutional: Denies fevers, chills or abnormal night sweats Eyes: Denies blurriness of vision, double vision or watery eyes Ears, nose, mouth, throat, and face: Denies mucositis or sore throat Respiratory: Denies cough, dyspnea or wheezes Cardiovascular: Denies palpitation, chest discomfort or lower extremity swelling Gastrointestinal:  Denies nausea, heartburn or change in bowel habits (+) mild diarrhea  Skin: Denies abnormal skin rashes (+) dry, and red hands (+) dry skin on feet (+) moles coming off Lymphatics: Denies new lymphadenopathy or easy bruising Neurological:Denies numbness, new weaknesses (+) tingling on right hands fingers, decrease in gripping  Behavioral/Psych: Mood is stable, no new changes  All other systems were reviewed with the patient and are negative.  PHYSICAL EXAMINATION:  ECOG PERFORMANCE STATUS: 1 - Symptomatic but completely ambulatory   BP (!) 174/78 (BP Location: Left Arm)   Pulse (!) 51   Temp 98 F (36.7 C) (Oral)   Resp 18   Ht 6' 1" (1.854 m)   Wt 248 lb 14.4 oz (112.9 kg)   SpO2 99%   BMI 32.84 kg/m    GENERAL:alert, no distress and comfortable SKIN: skin color, texture, turgor are normal, no rashes or significant lesions, (+) mild diffuse erythema on his palms, no skin peeling or cracks, much improved (+) dry and peeling skin on feet EYES: normal, conjunctiva are pink and non-injected, sclera clear OROPHARYNX:no exudate, no erythema and lips, buccal mucosa, and tongue normal  NECK: supple, thyroid  normal size, non-tender, without nodularity LYMPH:  no palpable lymphadenopathy in the cervical, axillary or inguinal LUNGS: clear to auscultation and percussion with normal breathing effort HEART: regular rate & rhythm and no murmurs and no lower extremity  edema ABDOMEN: abdomen soft, non-tender and normal bowel sounds, (+) colostomy bag, his midline incision has healed well  Musculoskeletal:no cyanosis of digits and no clubbing  PSYCH: alert & oriented x 3 with fluent speech NEURO: no focal motor/sensory deficits  LABORATORY DATA:  I have reviewed the data as listed CBC Latest Ref Rng & Units 12/27/2017 11/17/2017 10/27/2017  WBC 4.0 - 10.3 K/uL 8.4 7.5 8.5  Hemoglobin 13.0 - 17.1 g/dL 13.6 12.4(L) 12.5(L)  Hematocrit 38.4 - 49.9 % 40.7 38.2(L) 38.9  Platelets 140 - 400 K/uL 102(L) 139(L) 212    CMP Latest Ref Rng & Units 12/27/2017 11/17/2017 10/27/2017  Glucose 70 - 140 mg/dL 141(H) 118 94  BUN 7 - 26 mg/dL 14 17.5 13.0  Creatinine 0.70 - 1.30 mg/dL 1.16 1.3 1.2  Sodium 136 - 145 mmol/L 142 142 142  Potassium 3.5 - 5.1 mmol/L 3.5 4.0 4.1  Chloride 98 - 109 mmol/L 107 - -  CO2 22 - 29 mmol/L _0 Calcium 8.4 - 10.4 mg/dL 9.1 9.5 9.8  Total Protein 6.4 - 8.3 g/dL 7.1 6.9 7.4  Total Bilirubin 0.2 - 1.2 mg/dL 0.7 1.05 1.04  Alkaline Phos 40 - 150 U/L 83 79 78  AST 5 - 34 U/L _1 ALT 0 - 55 U/L _2 CEA (<5.6ng/ml) 06/09/2017: 1.8  07/22/2017: 1.34 10/06/17: 3.00  PATHOLOGY  Diagnosis 06/09/17 Colon, segmental resection for tumor, left - INVASIVE ADENOCARCINOMA, WELL DIFFERENTIATED, SPANNING 5 CM. - TUMOR INVADES THROUGH MUSCULARIS PROPRIA INTO SUBSEROSAL TISSUE. - RESECTION MARGINS ARE NEGATIVE. - METASTATIC CARCINOMA IN TWO OF TWENTY-SEVEN LYMPH NODES (2/27). - SEE ONCOLOGY TABLE. Microscopic Comment COLON AND RECTUM (INCLUDING TRANS-ANAL RESECTION): Specimen: Left colon. Procedure: Segmental resection. Tumor site: Transverse colon. Specimen  integrity: Intact. Macroscopic tumor perforation: Not identified. Invasive tumor: Maximum size: 5 cm Histologic type(s): Invasive adenocarcinoma. Histologic grade and differentiation: G1: well differentiated/low grade Type of polyp in which invasive carcinoma arose: N/A. Microscopic extension of invasive tumor: Tumor invades through muscularis propria into subserosal tissue. Lymph-Vascular invasion: Definitive invasion not identified. Peri-neural invasion: Not identified. Tumor deposit(s) (discontinuous extramural extension): Not identified. Resection margins: Proximal margin: Negative. Distal margin: Negative. Circumferential (radial) (posterior ascending, posterior descending; lateral and posterior mid-rectum; and entire lower 1/3 rectum): Negative. Mesenteric margin (sigmoid and transverse): Negative. Distance closest margin (if all above margins negative): 2 cm (mesenteric). Treatment effect (neo-adjuvant therapy): N/A. Microscopic Comment(continued) Additional polyp(s): None. Non-neoplastic findings: Diverticulosis. Lymph nodes: number examined 27; number positive: 2 Pathologic Staging: pT3, pN1b, pMX Ancillary studies: MSI and MMR will be ordered.   RADIOGRAPHIC STUDIES: I have personally reviewed the radiological images as listed and agreed with the findings in the report. Ct Abdomen Pelvis W Contrast  Result Date: 12/27/2017 CLINICAL DATA:  Colon cancer diagnosed 1/18 with resection and colostomy. Completed oral chemotherapy. Scheduled for colostomy reversal. EXAM: CT ABDOMEN AND PELVIS WITH CONTRAST TECHNIQUE: Multidetector CT imaging of the abdomen and pelvis was performed using the standard protocol following bolus administration of intravenous contrast. CONTRAST:  193m ISOVUE-300 IOPAMIDOL (ISOVUE-300) INJECTION 61% COMPARISON:  06/08/2017 FINDINGS: Lower chest: Clear lung bases. Normal heart size without pericardial or pleural effusion. Hepatobiliary: Too small to  characterize high left hepatic lobe 3 mm density lesion on image 11/series 2 is similar to on the prior exam. No new liver lesion. Normal gallbladder, without biliary ductal dilatation. Pancreas: Normal, without mass or ductal dilatation. Spleen: Normal in size, without focal abnormality. Adrenals/Urinary Tract: Normal  adrenal glands. Punctate interpolar right renal collecting system calculus. Low-density bilateral renal lesions are likely cysts. An anterior interpolar exophytic left renal lesion measures greater than fluid density and 2.4 cm on image 30/series 2. This is decreased in size compared to 2.8 cm on the prior. No hydronephrosis. Normal urinary bladder. Stomach/Bowel: Normal stomach, without wall thickening. Long Hartmann's pouch. Transverse colostomy, without acute complication. Normal terminal ileum. Normal small bowel. Vascular/Lymphatic: Aortic atherosclerosis. Small retroperitoneal nodes are similar and likely reactive. No abdominopelvic adenopathy. Reproductive: Moderate prostatomegaly. Other: No significant free fluid. Small bilateral fat containing inguinal hernias. No evidence of omental or peritoneal disease. Musculoskeletal: Lumbosacral junction discectomy. Transitional S1 vertebral body. IMPRESSION: 1. Interval partial colectomy with transverse colostomy and long Hartmann's pouch. 2. No evidence of recurrent or metastatic disease. 3. Too small to characterize left hepatic lobe lesion, similar to on the prior exam. Ongoing stability favors a benign lesion such as a tiny cyst. This could be re-evaluated at follow-up. 4. Left renal lesion which measures greater than fluid density but is decreased in size compared to the prior. This favors a complex cyst but is technically indeterminate. Recommend attention on follow-up. 5.  Aortic Atherosclerosis (ICD10-I70.0). 6. Right nephrolithiasis. Electronically Signed   By: Abigail Miyamoto M.D.   On: 12/27/2017 15:58     CT AP W Contrast  12/27/17 IMPRESSION: 1. Interval partial colectomy with transverse colostomy and long Hartmann's pouch. 2. No evidence of recurrent or metastatic disease. 3. Too small to characterize left hepatic lobe lesion, similar to on the prior exam. Ongoing stability favors a benign lesion such as a tiny cyst. This could be re-evaluated at follow-up. 4. Left renal lesion which measures greater than fluid density but is decreased in size compared to the prior. This favors a complex cyst but is technically indeterminate. Recommend attention on follow-up. 5.  Aortic Atherosclerosis (ICD10-I70.0). 6. Right nephrolithiasis.  CT Chest WO Contrast 07/20/17 IMPRESSION: No evidence of metastatic disease or other acute findings within the Thorax. Aortic Atherosclerosis (ICD10-I70.0).  CT AP W Contrast 06/08/17 IMPRESSION: 1. Mass of the transverse colon near splenic flexure measuring up to 4 cm compatible with colonic adenocarcinoma. No lymphadenopathy by imaging criteria. 2. Proximal dilatation of ascending and transverse colon with mild inflammatory changes likely due to obstruction. 3. 4 mm right middle lobe pulmonary nodule, attention on follow-up recommended. 4. 2 mm right kidney nonobstructing nephrolithiasis.  ASSESSMENT & PLAN:  James Doyle is a 72 y.o. male who has a history of arthritis.   1. Left colon cancer, invasive adenocarcinoma, G1, pT3N1bM0, stage IIIB, MSI-stable  -We reviewed his colon cancer and pathology results. it's stage IIIB with 2 out of 27 positive lymph nodes.  -staging CT abd/pel  Was negative for metastatic disease. -His CT chest was negative for metastasis, I reviewed the results with patient -His tumor was resected completely and he is cancer free now. However, he does  have a high risk of recurrence due to his locally advanced disease.  -we discussed adjuvant chemo is recommend and standard care for stage III colon cancer. So I strongly suggest he consider adjuvant  chemotherapy to reduce his risk of recurrence.  -We reviewed his chemo options, including single agent Xeloda or 5-FU for 6 months, or more standard option of iv chemo Oxaliplatin and 5-fu Pump (FOLFOX) every 2 weeks for 3 months.  -Patient has decided to take Xeloda alone, he declined intravenous chemotherapy. -He has started adjuvant Xeloda 08/05/17, tolerated first cycle very well, except mild  palm erythema.  -After cycle 2 he is fatigued with mild palmar-plantar erythema and moderate watery stool.  -For palmar-plantar erythema, I recommended over the counter hydrocortisone cream to hands and feet.  -Reduced to 2000 mg (4 tablets) in AM, 1500 mg (3 tablets) in PM starting with cycle 3 on 10/07/17. He has tolerated reduced dose much better.  -I again discussed continuing chemo for at least 3-4 months, the goal is 6 months. He is very reluctant to have chemo for 6 months, but is willing to complete 2 more cycles.  -I discussed with his modified treatment he is still at moderate risk of 30% risk of recurrence.  -He will proceed with last cycle Xeloda. I will send a message to Dr. Marcello Moores about proceeding with surgery.  -CT AP from 12/27/17 reveals o evidence of recurrent or metastatic disease. I discussed the results with the pt and family. They are very pleased. We will continue surveillance now.  -I discussed that we will follow up closely in the next 2-3 years because this is the highest tim e for risk of recurrence. And then we will follow up less often for the next 5 years -His BP is 174/78. I advised him to check this regularly at home and if it continues to Doyle high then he needs to see his PCP -Now that he has finished chemo, he will have the colostomy reversal surgery in the next month. -I again encouraged him to be active and this will help increase his energy level -F/u in 3 months    2. Nausea, diarrhea and fatigue  -We discussed these side effects are secondary to Xeloda -He has  intermittent mild nausea, I have prescribed Zofran PRN for him to try -I recommend imodium with first dose at first episode of loose stool, can take up to 8 per day per package instructions; he will buy today; I prescribed lomotil in case imodium is not enough -I have encouraged him to increase po fluids -His K is low, 3.0 (09/08/17), I added 20 mEq to NS IVF today and have sent prescription for 20 mEq oral K supplement, He will take 2 BID x3 days then 1 daily for 1 week -I encouraged him to take Zofran 30 minutes before morning and evening meals to optimize anti-nausea effect and maximize meal time nutrition. -much improved since he completed chemo   3. osteoarthritis  -f/u and monitoring   4. malnutrition, weight loss -he has lost 12 pounds in 3 weeks, related to decreased po intake, nausea, mucositis, and diarrhea secondary to Xeloda -f/u with dietician  -He drinks 3-4 boost per day and will continue.  5. AKI and hypokalemia  -Cr went from 1.1 on 07/22/17 to 1.7 on 09/08/2017, likely secondary to dehydration from diarrhea and adjuvant chemotherapy -Was given IV fluids with KCL  -oral KCL called in previously, he will continue oral potassium. I have encouraged him to use these to maximum doses for optimal benefit -I strongly encouraged him to drink more fluids -Potassium increased to 3.6. He will continue potassium pill once every other day.  -Liver enzymes and potassium have normalized as of 10/06/17. No longer taking K supplement   PLAN:  Lab and scan reviewed, no evidence of recurrence  prescribed lomotil and refilled urea cream today Lab and f/u in 3 months     No orders of the defined types were placed in this encounter.   All questions were answered. The patient knows to call the clinic with any problems,  questions or concerns. I spent 20 minutes counseling the patient face to face. The total time spent in the appointment was 25 minutes and more than 50% was on  counseling.      Truitt Merle, MD 12/29/2017 5:27 PM   This document serves as a record of services personally performed by Truitt Merle, MD. It was created on her behalf by Theresia Bough, a trained medical scribe. The creation of this record is based on the scribe's personal observations and the provider's statements to them.   I have reviewed the above documentation for accuracy and completeness, and I agree with the above.

## 2017-12-28 LAB — CEA (IN HOUSE-CHCC): CEA (CHCC-IN HOUSE): 1.86 ng/mL (ref 0.00–5.00)

## 2017-12-29 ENCOUNTER — Telehealth: Payer: Self-pay | Admitting: Hematology

## 2017-12-29 ENCOUNTER — Inpatient Hospital Stay (HOSPITAL_BASED_OUTPATIENT_CLINIC_OR_DEPARTMENT_OTHER): Payer: Medicare Other | Admitting: Hematology

## 2017-12-29 ENCOUNTER — Encounter: Payer: Self-pay | Admitting: Hematology

## 2017-12-29 VITALS — BP 174/78 | HR 51 | Temp 98.0°F | Resp 18 | Ht 73.0 in | Wt 248.9 lb

## 2017-12-29 DIAGNOSIS — G629 Polyneuropathy, unspecified: Secondary | ICD-10-CM | POA: Diagnosis not present

## 2017-12-29 DIAGNOSIS — R197 Diarrhea, unspecified: Secondary | ICD-10-CM

## 2017-12-29 DIAGNOSIS — M170 Bilateral primary osteoarthritis of knee: Secondary | ICD-10-CM

## 2017-12-29 DIAGNOSIS — C186 Malignant neoplasm of descending colon: Secondary | ICD-10-CM

## 2017-12-29 MED ORDER — DIPHENOXYLATE-ATROPINE 2.5-0.025 MG PO TABS: 1.0000 | ORAL_TABLET | Freq: Four times a day (QID) | ORAL | 0 refills | Status: DC | PRN

## 2017-12-29 MED ORDER — UREA 10 % EX CREA: TOPICAL_CREAM | CUTANEOUS | 0 refills | Status: DC | PRN

## 2017-12-29 NOTE — Telephone Encounter (Signed)
Gave avs and calendar for april °

## 2018-01-04 ENCOUNTER — Ambulatory Visit: Payer: Self-pay | Admitting: General Surgery

## 2018-01-04 MED ORDER — BUPIVACAINE LIPOSOME 1.3 % IJ SUSP: 20.0000 mL | INTRAMUSCULAR | Status: AC

## 2018-01-04 NOTE — H&P (Signed)
History of Present Illness Leighton Ruff MD; 08/02/2354 11:18 AM) The patient is a 72 year old male who presents with colorectal cancer. The patient is being seen for initial consultation for Stage III colon cancer. 72 year old male who presented emergently to the hospital in July with an obstructing colon cancer. He underwent a laparoscopic assisted left hemicolectomy for a colon cancer of the splenic flexure. Given that he was obstructed, he required a colostomy. He has been doing well at home. He is tolerating a diet and having regular bowel function. He has completed a 6 month course of Xeloda adjuvant chemotherapy.   Problem List/Past Medical Leighton Ruff, MD; 06/01/2201 11:41 AM) Gareth Morgan FOR REMOVAL OF STAPLES (Z48.02) COLON CANCER METASTASIZED TO MESENTERIC LYMPH NODES (C18.9)  Past Surgical History Leighton Ruff, MD; 04/02/2705 11:27 AM) Colon Polyp Removal - Colonoscopy Knee Surgery Right. Spinal Surgery - Lower Back Spinal Surgery Midback Tonsillectomy  Diagnostic Studies History Leighton Ruff, MD; 01/02/7627 11:27 AM) Colonoscopy 1-5 years ago  Allergies Malachi Bonds, CMA; 01/04/2018 10:57 AM) No Known Drug Allergies [06/08/2017]:  Medication History Leighton Ruff, MD; 01/29/5175 11:27 AM) Tylenol (500MG  Capsule, Oral) Active. Medications Reconciled Neomycin Sulfate (500MG  Tablet, 2 (two) Oral SEE NOTE, Taken starting 01/04/2018) Active. (TAKE TWO TABLETS AT 2 PM, 3 PM, AND 10 PM THE DAY PRIOR TO SURGERY) No Current Medications (Taken starting 01/04/2018) Flagyl (500MG  Tablet, 2 (two) Oral SEE NOTE, Taken starting 01/04/2018) Active. (Take at 2pm, 3pm, and 10pm the day prior to your colon operation)  Social History Leighton Ruff, MD; 12/06/735 11:27 AM) Caffeine use Tea. No alcohol use No drug use Tobacco use Never smoker.  Family History Leighton Ruff, MD; 1/0/6269 11:27 AM) Alcohol Abuse Father. Bleeding disorder  Mother. Cerebrovascular Accident Father. Colon Polyps Brother. Diabetes Mellitus Brother. Heart Disease Brother, Mother. Heart disease in male family member before age 56 Heart disease in male family member before age 46 Hypertension Brother, Father, Mother. Migraine Headache Brother, Daughter.  Other Problems Leighton Ruff, MD; 03/07/5461 11:41 AM) Arthritis Back Pain Hemorrhoids Other disease, cancer, significant illness     Review of Systems Leighton Ruff MD; 7/0/3500 11:40 AM) General Present- Appetite Loss and Fatigue. Not Present- Chills, Fever, Night Sweats, Weight Gain and Weight Loss. Skin Not Present- Change in Wart/Mole, Dryness, Hives, Jaundice, New Lesions, Non-Healing Wounds, Rash and Ulcer. HEENT Present- Seasonal Allergies and Wears glasses/contact lenses. Not Present- Earache, Hearing Loss, Hoarseness, Nose Bleed, Oral Ulcers, Ringing in the Ears, Sinus Pain, Sore Throat, Visual Disturbances and Yellow Eyes. Respiratory Not Present- Bloody sputum, Chronic Cough, Difficulty Breathing, Snoring and Wheezing. Breast Not Present- Breast Mass, Breast Pain, Nipple Discharge and Skin Changes. Cardiovascular Present- Leg Cramps. Not Present- Chest Pain, Difficulty Breathing Lying Down, Palpitations, Rapid Heart Rate, Shortness of Breath and Swelling of Extremities. Gastrointestinal Present- Abdominal Pain, Bloating, Change in Bowel Habits, Constipation, Excessive gas, Gets full quickly at meals, Indigestion and Nausea. Not Present- Bloody Stool, Chronic diarrhea, Difficulty Swallowing, Hemorrhoids, Rectal Pain and Vomiting. Male Genitourinary Not Present- Blood in Urine, Change in Urinary Stream, Frequency, Impotence, Nocturia, Painful Urination, Urgency and Urine Leakage. Musculoskeletal Present- Back Pain, Joint Pain and Joint Stiffness. Not Present- Muscle Pain, Muscle Weakness and Swelling of Extremities. Neurological Not Present- Decreased Memory, Fainting,  Headaches, Numbness, Seizures, Tingling, Tremor, Trouble walking and Weakness. Psychiatric Not Present- Anxiety, Bipolar, Change in Sleep Pattern, Depression, Fearful and Frequent crying. Endocrine Not Present- Cold Intolerance, Excessive Hunger, Hair Changes, Heat Intolerance and New Diabetes. Hematology Not Present- Blood Thinners,  Easy Bruising, Excessive bleeding, Gland problems, HIV and Persistent Infections.  Vitals (Chemira Jones CMA; 01/04/2018 10:57 AM) 01/04/2018 10:57 AM Weight: 246.8 lb Height: 73in Body Surface Area: 2.35 m Body Mass Index: 32.56 kg/m  Temp.: 99.45F(Oral)  Pulse: 76 (Regular)  BP: 138/78 (Sitting, Left Arm, Standard)      Physical Exam Leighton Ruff MD; 01/06/8340 11:40 AM)  The physical exam findings are as follows: Note:GENERAL APPEARANCE: Ashen, ill appearing male. Pleasant and cooperative.   EYES external: conjunctiva, lids, sclerae normal pupils: equal, round glasses: no  NECK: Supple, no obvious mass or thyroid mass/enlargement, no trachea deviation  CV ascultation: RRR, no murmur extremity edema: no  RESP/CHEST auscultation: breath sounds equal and clear respiratory effort: normal   GASTROINTESTINAL abdomen: Soft, mild RLQ tenderness, with distension, no masses, hypoactive bowel sounds liver and spleen: not enlarged. hernia: none present ostomy: pink and viable scar: healing well  LYMPHATIC: No palpable cervical, supraclavicular adenopathy.  SKIN jaundice: none rash or lesion: none  NEUROLOGIC speech: normal  PSYCHIATRIC alertness and orientation: normal mood/affect/behavior: normal judgement and insight: normal    Assessment & Plan Leighton Ruff MD; 08/05/2228 11:41 AM)  COLON CANCER METASTASIZED TO MESENTERIC LYMPH NODES (C18.9) Impression: 72 year old male status post Hartman's resection for obstructing colon cancer in July 2018. He has completed her adjuvant lota therapy and is now in  surveillance. He is ready for his colostomy reversal. We'll plan on doing this laparoscopically assisted. We have discussed this in detail. If he does have signs of carcinomatosis, we have discussed that we will not reverse his colostomy. The surgery and anatomy were described to the patient as well as the risks of surgery and the possible complications. These include: Bleeding, deep abdominal infections and possible wound complications such as hernia and infection, damage to adjacent structures, leak of surgical connections, which can lead to other surgeries and possibly an ostomy, possible need for other procedures, such as abscess drains in radiology, possible prolonged hospital stay, possible diarrhea from removal of part of the colon, possible constipation from narcotics, possible bowel, bladder or sexual dysfunction if having rectal surgery, prolonged fatigue/weakness or appetite loss, possible early recurrence of of disease, possible complications of their medical problems such as heart disease or arrhythmias or lung problems, death (less than 1%). I believe the patient understands and wishes to proceed with the surgery.

## 2018-02-01 ENCOUNTER — Encounter (HOSPITAL_COMMUNITY): Payer: Self-pay

## 2018-02-01 NOTE — Pre-Procedure Instructions (Signed)
The following are in epic: Last office visit note with Dr. Burr Medico 12/29/2017 CT abd/pelvis 12/27/2017

## 2018-02-01 NOTE — Patient Instructions (Addendum)
Your procedure is scheduled on: Wednesday, February 09, 2018   Surgery Time:  12:30PM-4:30PM   Report to [facility] Main  Entrance    Report to admitting at 10:30 AM   Call this number if you have problems the morning of surgery [phone]   Do not eat food or drink liquids :After Midnight.   Do NOT smoke after Midnight   Drink 2 Ensure drinks the night before surgery.  Complete one Ensure drink the morning of surgery.   Take these medicines the morning of surgery with A SIP OF WATER: None                               You may not have any metal on your body including  jewelry, and body piercings             Do not wear  lotions, powders, perfumes/cologne, or deodorant                          Men may shave face and neck.   Do not bring valuables to the hospital. [place].   Contacts, dentures or bridgework may not be worn into surgery.   Leave suitcase in the car. After surgery it may be brought to your room.     Special Instructions: Bring a copy of your healthcare power of attorney and living will documents         the day of surgery if you haven't scanned them in before.              Please read over the following fact sheets you were given:  [facility] - Preparing for Surgery Before surgery, you can play an important role.  Because skin is not sterile, your skin needs to be as free of germs as possible.  You can reduce the number of germs on your skin by washing with CHG (chlorahexidine gluconate) soap before surgery.  CHG is an antiseptic cleaner which kills germs and bonds with the skin to continue killing germs even after washing. Please DO NOT use if you have an allergy to CHG or antibacterial soaps.  If your skin becomes reddened/irritated stop using the CHG and inform your nurse when you arrive at Short Stay. Do not shave (including legs and underarms) for at least 48 hours prior to the first CHG  shower.  You may shave your face/neck.  Please follow these instructions carefully:  1.  Shower with CHG Soap the night before surgery and the  morning of surgery.  2.  If you choose to wash your hair, wash your hair first as usual with your normal  shampoo.  3.  After you shampoo, rinse your hair and body thoroughly to remove the shampoo.                             4.  Use CHG as you would any other liquid soap.  You can apply chg directly to the skin and wash.  Gently with a scrungie or clean washcloth.  5.  Apply the CHG Soap to your body ONLY FROM THE NECK DOWN.   Do   not use on face/ open  Wound or open sores. Avoid contact with eyes, ears mouth and   genitals (private parts).                       Wash face,  Genitals (private parts) with your normal soap.             6.  Wash thoroughly, paying special attention to the area where your    surgery  will be performed.  7.  Thoroughly rinse your body with warm water from the neck down.  8.  DO NOT shower/wash with your normal soap after using and rinsing off the CHG Soap.                9.  Pat yourself dry with a clean towel.            10.  Wear clean pajamas.            11.  Place clean sheets on your bed the night of your first shower and do not  sleep with pets. Day of Surgery : Do not apply any lotions/deodorants the morning of surgery.  Please wear clean clothes to the hospital/surgery center.  FAILURE TO FOLLOW THESE INSTRUCTIONS MAY RESULT IN THE CANCELLATION OF YOUR SURGERY  PATIENT SIGNATURE_________________________________  NURSE SIGNATURE__________________________________  ________________________________________________________________________   James Doyle  An incentive spirometer is a tool that can help keep your lungs clear and active. This tool measures how well you are filling your lungs with each breath. Taking long deep breaths may help reverse or decrease the chance of  developing breathing (pulmonary) problems (especially infection) following:  A long period of time when you are unable to move or be active. BEFORE THE PROCEDURE   If the spirometer includes an indicator to show your best effort, your nurse or respiratory therapist will set it to a desired goal.  If possible, sit up straight or lean slightly forward. Try not to slouch.  Hold the incentive spirometer in an upright position. INSTRUCTIONS FOR USE  1. Sit on the edge of your bed if possible, or sit up as far as you can in bed or on a chair. 2. Hold the incentive spirometer in an upright position. 3. Breathe out normally. 4. Place the mouthpiece in your mouth and seal your lips tightly around it. 5. Breathe in slowly and as deeply as possible, raising the piston or the ball toward the top of the column. 6. Hold your breath for 3-5 seconds or for as long as possible. Allow the piston or ball to fall to the bottom of the column. 7. Remove the mouthpiece from your mouth and breathe out normally. 8. Rest for a few seconds and repeat Steps 1 through 7 at least 10 times every 1-2 hours when you are awake. Take your time and take a few normal breaths between deep breaths. 9. The spirometer may include an indicator to show your best effort. Use the indicator as a goal to work toward during each repetition. 10. After each set of 10 deep breaths, practice coughing to be sure your lungs are clear. If you have an incision (the cut made at the time of surgery), support your incision when coughing by placing a pillow or rolled up towels firmly against it. Once you are able to get out of bed, walk around indoors and cough well. You may stop using the incentive spirometer when instructed by your caregiver.  RISKS AND COMPLICATIONS  Take your time  so you do not get dizzy or light-headed.  If you are in pain, you may need to take or ask for pain medication before doing incentive spirometry. It is harder to take a  deep breath if you are having pain. AFTER USE  Rest and breathe slowly and easily.  It can be helpful to keep track of a log of your progress. Your caregiver can provide you with a simple table to help with this. If you are using the spirometer at home, follow these instructions: [place] IF:   You are having difficultly using the spirometer.  You have trouble using the spirometer as often as instructed.  Your pain medication is not giving enough relief while using the spirometer.  You develop fever of 100.5 F (38.1 C) or higher. SEEK IMMEDIATE MEDICAL CARE IF:   You cough up bloody sputum that had not been present before.  You develop fever of 102 F (38.9 C) or greater.  You develop worsening pain at or near the incision site. MAKE SURE YOU:   Understand these instructions.  Will watch your condition.  Will get help right away if you are not doing well or get worse. Document Released: 03/29/2007 Document Revised: 02/08/2012 Document Reviewed: 05/30/2007 [facility] Patient Information 2014 [place], [place].   ________________________________________________________________________

## 2018-02-02 ENCOUNTER — Other Ambulatory Visit: Payer: Self-pay

## 2018-02-02 ENCOUNTER — Encounter (HOSPITAL_COMMUNITY): Payer: Self-pay

## 2018-02-02 ENCOUNTER — Encounter (HOSPITAL_COMMUNITY)
Admission: RE | Admit: 2018-02-02 | Discharge: 2018-02-02 | Disposition: A | Discharge status: Home / Self Care | Payer: Medicare Other | Source: Ambulatory Visit | Attending: General Surgery | Admitting: General Surgery

## 2018-02-02 DIAGNOSIS — Z01812 Encounter for preprocedural laboratory examination: Secondary | ICD-10-CM | POA: Insufficient documentation

## 2018-02-02 HISTORY — DX: Other chronic pain: G89.29

## 2018-02-02 HISTORY — DX: Other intervertebral disc degeneration, lumbar region: M51.36

## 2018-02-02 HISTORY — DX: Personal history of other endocrine, nutritional and metabolic disease: Z86.39

## 2018-02-02 HISTORY — DX: Cellulitis of left upper limb: L03.114

## 2018-02-02 HISTORY — DX: Atherosclerosis of aorta: I70.0

## 2018-02-02 HISTORY — DX: Pain in right knee: M25.561

## 2018-02-02 HISTORY — DX: Pain in right hip: M25.551

## 2018-02-02 HISTORY — DX: Dorsalgia, unspecified: M54.9

## 2018-02-02 HISTORY — DX: Personal history of (healed) traumatic fracture: Z87.81

## 2018-02-02 HISTORY — DX: Other intervertebral disc degeneration, lumbar region without mention of lumbar back pain or lower extremity pain: M51.369

## 2018-02-02 HISTORY — DX: Calculus of kidney: N20.0

## 2018-02-02 LAB — BASIC METABOLIC PANEL
Anion gap: 9 (ref 5–15)
BUN: 14 mg/dL (ref 6–20)
CALCIUM: 9.2 mg/dL (ref 8.9–10.3)
CO2: 27 mmol/L (ref 22–32)
Chloride: 103 mmol/L (ref 101–111)
Creatinine, Ser: 1.18 mg/dL (ref 0.61–1.24)
GFR, EST NON AFRICAN AMERICAN: 59 mL/min — AB (ref >60)
Glucose, Bld: 100 mg/dL — ABNORMAL HIGH (ref 65–99)
Potassium: 3.6 mmol/L (ref 3.5–5.1)
SODIUM: 139 mmol/L (ref 135–145)

## 2018-02-02 LAB — CBC
HCT: 43.5 % (ref 39.0–52.0)
Hemoglobin: 14.3 g/dL (ref 13.0–17.0)
MCH: 30.4 pg (ref 26.0–34.0)
MCHC: 32.9 g/dL (ref 30.0–36.0)
MCV: 92.4 fL (ref 78.0–100.0)
PLATELETS: 153 10*3/uL (ref 150–400)
RBC: 4.71 MIL/uL (ref 4.22–5.81)
RDW: 14.4 % (ref 11.5–15.5)
WBC: 7.1 10*3/uL (ref 4.0–10.5)

## 2018-02-02 MED FILL — metroNIDAZOLE 500 MG TABS: 500 | 1 days supply | Qty: 6 | Fill #0

## 2018-02-02 MED FILL — NEOMYCIN 500 MG TABLET: 500 | 1 days supply | Qty: 6 | Fill #0

## 2018-02-09 ENCOUNTER — Inpatient Hospital Stay (HOSPITAL_COMMUNITY): Payer: Medicare Other | Admitting: Anesthesiology

## 2018-02-09 ENCOUNTER — Other Ambulatory Visit: Payer: Self-pay

## 2018-02-09 ENCOUNTER — Inpatient Hospital Stay (HOSPITAL_COMMUNITY)
Admission: RE | Admit: 2018-02-09 | Discharge: 2018-02-12 | DRG: 331 | Disposition: A | Discharge status: Home / Self Care | Payer: Medicare Other | Source: Ambulatory Visit | Attending: General Surgery | Admitting: General Surgery

## 2018-02-09 ENCOUNTER — Encounter (HOSPITAL_COMMUNITY)
Admission: RE | Disposition: A | Discharge status: Home / Self Care | Payer: Self-pay | Source: Ambulatory Visit | Attending: General Surgery

## 2018-02-09 ENCOUNTER — Encounter (HOSPITAL_COMMUNITY): Payer: Self-pay | Admitting: *Deleted

## 2018-02-09 DIAGNOSIS — Z85038 Personal history of other malignant neoplasm of large intestine: Secondary | ICD-10-CM | POA: Diagnosis not present

## 2018-02-09 DIAGNOSIS — Z433 Encounter for attention to colostomy: Principal | ICD-10-CM

## 2018-02-09 DIAGNOSIS — Z933 Colostomy status: Secondary | ICD-10-CM

## 2018-02-09 DIAGNOSIS — K56609 Unspecified intestinal obstruction, unspecified as to partial versus complete obstruction: Secondary | ICD-10-CM | POA: Diagnosis present

## 2018-02-09 DIAGNOSIS — M1710 Unilateral primary osteoarthritis, unspecified knee: Secondary | ICD-10-CM | POA: Diagnosis present

## 2018-02-09 DIAGNOSIS — C186 Malignant neoplasm of descending colon: Secondary | ICD-10-CM | POA: Diagnosis present

## 2018-02-09 DIAGNOSIS — M179 Osteoarthritis of knee, unspecified: Secondary | ICD-10-CM | POA: Diagnosis present

## 2018-02-09 HISTORY — DX: Malignant neoplasm of descending colon: C18.6

## 2018-02-09 HISTORY — PX: COLOSTOMY TAKEDOWN: SHX5258

## 2018-02-09 HISTORY — DX: Unspecified intestinal obstruction, unspecified as to partial versus complete obstruction: K56.609

## 2018-02-09 SURGERY — CLOSURE, COLOSTOMY, LAPAROSCOPIC
Anesthesia: General | Site: Abdomen

## 2018-02-09 MED ORDER — KETAMINE HCL 10 MG/ML IJ SOLN
INTRAMUSCULAR | Status: DC | PRN
  Administered 2018-02-09: 10 mg via INTRAVENOUS
  Administered 2018-02-09: 30 mg via INTRAVENOUS

## 2018-02-09 MED ORDER — SUGAMMADEX SODIUM 200 MG/2ML IV SOLN
INTRAVENOUS | Status: DC | PRN
  Administered 2018-02-09: 200 mg via INTRAVENOUS

## 2018-02-09 MED ORDER — SUGAMMADEX SODIUM 200 MG/2ML IV SOLN
INTRAVENOUS | Status: AC
  Filled 2018-02-09: qty 2

## 2018-02-09 MED ORDER — LIDOCAINE HCL 2 % IJ SOLN
INTRAMUSCULAR | Status: AC
  Filled 2018-02-09: qty 20

## 2018-02-09 MED ORDER — PHENYLEPHRINE 40 MCG/ML (10ML) SYRINGE FOR IV PUSH (FOR BLOOD PRESSURE SUPPORT)
PREFILLED_SYRINGE | INTRAVENOUS | Status: AC
  Filled 2018-02-09: qty 10

## 2018-02-09 MED ORDER — ALUM & MAG HYDROXIDE-SIMETH 200-200-20 MG/5ML PO SUSP: 30.0000 mL | Freq: Four times a day (QID) | ORAL | Status: DC | PRN

## 2018-02-09 MED ORDER — PHENYLEPHRINE HCL 10 MG/ML IJ SOLN
INTRAMUSCULAR | Status: DC | PRN
  Administered 2018-02-09: 80 ug via INTRAVENOUS
  Administered 2018-02-09 (×2): 120 ug via INTRAVENOUS

## 2018-02-09 MED ORDER — EPHEDRINE SULFATE-NACL 50-0.9 MG/10ML-% IV SOSY
PREFILLED_SYRINGE | INTRAVENOUS | Status: DC | PRN
  Administered 2018-02-09 (×3): 10 mg via INTRAVENOUS

## 2018-02-09 MED ORDER — KCL IN DEXTROSE-NACL 20-5-0.45 MEQ/L-%-% IV SOLN
INTRAVENOUS | Status: DC
  Administered 2018-02-09: 1000 mL via INTRAVENOUS
  Administered 2018-02-11: 05:00:00 via INTRAVENOUS
  Filled 2018-02-09 (×2): qty 1000

## 2018-02-09 MED ORDER — FENTANYL CITRATE (PF) 100 MCG/2ML IJ SOLN
INTRAMUSCULAR | Status: DC | PRN
  Administered 2018-02-09 (×2): 50 ug via INTRAVENOUS

## 2018-02-09 MED ORDER — ONDANSETRON HCL 4 MG/2ML IJ SOLN
INTRAMUSCULAR | Status: AC
  Filled 2018-02-09: qty 2

## 2018-02-09 MED ORDER — ONDANSETRON HCL 4 MG/2ML IJ SOLN
INTRAMUSCULAR | Status: DC | PRN
  Administered 2018-02-09: 4 mg via INTRAVENOUS

## 2018-02-09 MED ORDER — 0.9 % SODIUM CHLORIDE (POUR BTL) OPTIME
TOPICAL | Status: DC | PRN
  Administered 2018-02-09: 1000 mL

## 2018-02-09 MED ORDER — BUPIVACAINE LIPOSOME 1.3 % IJ SUSP
20.0000 mL | Freq: Once | INTRAMUSCULAR | Status: AC
  Administered 2018-02-09: 20 mL
  Filled 2018-02-09: qty 20

## 2018-02-09 MED ORDER — ALVIMOPAN 12 MG PO CAPS
12.0000 mg | ORAL_CAPSULE | Freq: Two times a day (BID) | ORAL | Status: DC
  Administered 2018-02-10 – 2018-02-11 (×3): 12 mg via ORAL
  Filled 2018-02-09 (×3): qty 1

## 2018-02-09 MED ORDER — ACETAMINOPHEN 500 MG PO TABS
1000.0000 mg | ORAL_TABLET | Freq: Four times a day (QID) | ORAL | Status: DC
  Administered 2018-02-09 – 2018-02-12 (×9): 1000 mg via ORAL
  Filled 2018-02-09 (×10): qty 2

## 2018-02-09 MED ORDER — BUPIVACAINE-EPINEPHRINE (PF) 0.5% -1:200000 IJ SOLN
INTRAMUSCULAR | Status: DC | PRN
  Administered 2018-02-09: 30 mL

## 2018-02-09 MED ORDER — PHENYLEPHRINE 40 MCG/ML (10ML) SYRINGE FOR IV PUSH (FOR BLOOD PRESSURE SUPPORT)
PREFILLED_SYRINGE | INTRAVENOUS | Status: DC | PRN
  Administered 2018-02-09: 120 ug via INTRAVENOUS
  Administered 2018-02-09: 40 ug via INTRAVENOUS

## 2018-02-09 MED ORDER — LACTATED RINGERS IV SOLN
INTRAVENOUS | Status: DC
  Administered 2018-02-09 (×2): via INTRAVENOUS

## 2018-02-09 MED ORDER — ALVIMOPAN 12 MG PO CAPS
12.0000 mg | ORAL_CAPSULE | ORAL | Status: AC
  Administered 2018-02-09: 12 mg via ORAL
  Filled 2018-02-09: qty 1

## 2018-02-09 MED ORDER — DEXAMETHASONE SODIUM PHOSPHATE 10 MG/ML IJ SOLN
INTRAMUSCULAR | Status: DC | PRN
  Administered 2018-02-09: 10 mg via INTRAVENOUS

## 2018-02-09 MED ORDER — PROPOFOL 10 MG/ML IV BOLUS
INTRAVENOUS | Status: DC | PRN
  Administered 2018-02-09: 180 mg via INTRAVENOUS

## 2018-02-09 MED ORDER — DIPHENHYDRAMINE HCL 50 MG/ML IJ SOLN: 12.5000 mg | Freq: Four times a day (QID) | INTRAMUSCULAR | Status: DC | PRN

## 2018-02-09 MED ORDER — MIDAZOLAM HCL 2 MG/2ML IJ SOLN
INTRAMUSCULAR | Status: AC
  Filled 2018-02-09: qty 2

## 2018-02-09 MED ORDER — EPHEDRINE 5 MG/ML INJ
INTRAVENOUS | Status: AC
  Filled 2018-02-09: qty 10

## 2018-02-09 MED ORDER — ONDANSETRON HCL 4 MG PO TABS
4.0000 mg | ORAL_TABLET | Freq: Four times a day (QID) | ORAL | Status: DC | PRN
Start: 2018-02-09 — End: 2018-02-12

## 2018-02-09 MED ORDER — HYDROMORPHONE HCL 1 MG/ML IJ SOLN
0.5000 mg | INTRAMUSCULAR | Status: DC | PRN
Start: 2018-02-09 — End: 2018-02-12
  Administered 2018-02-09 – 2018-02-10 (×3): 0.5 mg via INTRAVENOUS
  Filled 2018-02-09 (×3): qty 0.5

## 2018-02-09 MED ORDER — BUPIVACAINE-EPINEPHRINE (PF) 0.5% -1:200000 IJ SOLN
INTRAMUSCULAR | Status: AC
  Filled 2018-02-09: qty 30

## 2018-02-09 MED ORDER — ENOXAPARIN SODIUM 40 MG/0.4ML ~~LOC~~ SOLN
40.0000 mg | SUBCUTANEOUS | Status: DC
  Administered 2018-02-10 – 2018-02-12 (×3): 40 mg via SUBCUTANEOUS
  Filled 2018-02-09 (×3): qty 0.4

## 2018-02-09 MED ORDER — PROPOFOL 10 MG/ML IV BOLUS
INTRAVENOUS | Status: AC
  Filled 2018-02-09: qty 20

## 2018-02-09 MED ORDER — SODIUM CHLORIDE 0.9 % IJ SOLN
INTRAMUSCULAR | Status: AC
  Filled 2018-02-09: qty 50

## 2018-02-09 MED ORDER — FENTANYL CITRATE (PF) 100 MCG/2ML IJ SOLN: 25.0000 ug | INTRAMUSCULAR | Status: DC | PRN

## 2018-02-09 MED ORDER — ROCURONIUM BROMIDE 10 MG/ML (PF) SYRINGE
PREFILLED_SYRINGE | INTRAVENOUS | Status: DC | PRN
  Administered 2018-02-09 (×2): 10 mg via INTRAVENOUS
  Administered 2018-02-09: 60 mg via INTRAVENOUS
  Administered 2018-02-09: 20 mg via INTRAVENOUS

## 2018-02-09 MED ORDER — ONDANSETRON HCL 4 MG/2ML IJ SOLN
4.0000 mg | Freq: Four times a day (QID) | INTRAMUSCULAR | Status: DC | PRN
Start: 2018-02-09 — End: 2018-02-12

## 2018-02-09 MED ORDER — LABETALOL HCL 5 MG/ML IV SOLN
INTRAVENOUS | Status: DC | PRN
  Administered 2018-02-09 (×2): 2.5 mg via INTRAVENOUS

## 2018-02-09 MED ORDER — BUPIVACAINE LIPOSOME 1.3 % IJ SUSP: INTRAMUSCULAR | Status: DC | PRN

## 2018-02-09 MED ORDER — FENTANYL CITRATE (PF) 100 MCG/2ML IJ SOLN
INTRAMUSCULAR | Status: AC
  Filled 2018-02-09: qty 2

## 2018-02-09 MED ORDER — MIDAZOLAM HCL 5 MG/5ML IJ SOLN
INTRAMUSCULAR | Status: DC | PRN
  Administered 2018-02-09: 2 mg via INTRAVENOUS

## 2018-02-09 MED ORDER — CEFOTETAN DISODIUM-DEXTROSE 2-2.08 GM-%(50ML) IV SOLR
2.0000 g | INTRAVENOUS | Status: AC
  Administered 2018-02-09: 2 g via INTRAVENOUS
  Filled 2018-02-09: qty 50

## 2018-02-09 MED ORDER — SODIUM CHLORIDE 0.9 % IV SOLN
2.0000 g | Freq: Two times a day (BID) | INTRAVENOUS | Status: AC
  Administered 2018-02-09: 2 g via INTRAVENOUS
  Filled 2018-02-09: qty 2

## 2018-02-09 MED ORDER — LIDOCAINE 2% (20 MG/ML) 5 ML SYRINGE
INTRAMUSCULAR | Status: DC | PRN
  Administered 2018-02-09: 100 mg via INTRAVENOUS

## 2018-02-09 MED ORDER — SACCHAROMYCES BOULARDII 250 MG PO CAPS
250.0000 mg | ORAL_CAPSULE | Freq: Two times a day (BID) | ORAL | Status: DC
  Administered 2018-02-09 – 2018-02-12 (×6): 250 mg via ORAL
  Filled 2018-02-09 (×6): qty 1

## 2018-02-09 MED ORDER — DIPHENHYDRAMINE HCL 12.5 MG/5ML PO ELIX: 12.5000 mg | ORAL_SOLUTION | Freq: Four times a day (QID) | ORAL | Status: DC | PRN

## 2018-02-09 MED ORDER — ACETAMINOPHEN 500 MG PO TABS
1000.0000 mg | ORAL_TABLET | ORAL | Status: AC
  Administered 2018-02-09: 1000 mg via ORAL
  Filled 2018-02-09: qty 2

## 2018-02-09 MED ORDER — LIDOCAINE 2% (20 MG/ML) 5 ML SYRINGE
INTRAMUSCULAR | Status: DC | PRN
  Administered 2018-02-09: 1 mg/kg/h via INTRAVENOUS

## 2018-02-09 MED ORDER — GABAPENTIN 300 MG PO CAPS
300.0000 mg | ORAL_CAPSULE | ORAL | Status: AC
  Administered 2018-02-09: 300 mg via ORAL
  Filled 2018-02-09: qty 1

## 2018-02-09 MED ORDER — LACTATED RINGERS IR SOLN
Status: DC | PRN
  Administered 2018-02-09: 1000 mL

## 2018-02-09 MED ORDER — DEXAMETHASONE SODIUM PHOSPHATE 10 MG/ML IJ SOLN
INTRAMUSCULAR | Status: AC
  Filled 2018-02-09: qty 1

## 2018-02-09 SURGICAL SUPPLY — 69 items
APPLIER CLIP 5 13 M/L LIGAMAX5 (MISCELLANEOUS)
BLADE EXTENDED COATED 6.5IN (ELECTRODE) IMPLANT
CABLE HIGH FREQUENCY MONO STRZ (ELECTRODE) ×2 IMPLANT
CELLS DAT CNTRL 66122 CELL SVR (MISCELLANEOUS) IMPLANT
CHLORAPREP W/TINT 26ML (MISCELLANEOUS) ×2 IMPLANT
CLIP APPLIE 5 13 M/L LIGAMAX5 (MISCELLANEOUS) IMPLANT
DECANTER SPIKE VIAL GLASS SM (MISCELLANEOUS) ×2 IMPLANT
DERMABOND ADVANCED (GAUZE/BANDAGES/DRESSINGS) ×1
DERMABOND ADVANCED .7 DNX12 (GAUZE/BANDAGES/DRESSINGS) ×1 IMPLANT
DRAIN CHANNEL 19F RND (DRAIN) IMPLANT
DRAPE LAPAROSCOPIC ABDOMINAL (DRAPES) ×2 IMPLANT
DRAPE SURG IRRIG POUCH 19X23 (DRAPES) ×2 IMPLANT
DRSG OPSITE POSTOP 4X10 (GAUZE/BANDAGES/DRESSINGS) IMPLANT
DRSG OPSITE POSTOP 4X6 (GAUZE/BANDAGES/DRESSINGS) IMPLANT
DRSG OPSITE POSTOP 4X8 (GAUZE/BANDAGES/DRESSINGS) IMPLANT
DRSG TELFA 3X8 NADH (GAUZE/BANDAGES/DRESSINGS) ×2 IMPLANT
ELECT PENCIL ROCKER SW 15FT (MISCELLANEOUS) ×4 IMPLANT
ELECT REM PT RETURN 15FT ADLT (MISCELLANEOUS) ×2 IMPLANT
EVACUATOR SILICONE 100CC (DRAIN) IMPLANT
GAUZE SPONGE 4X4 12PLY STRL (GAUZE/BANDAGES/DRESSINGS) ×2 IMPLANT
GLOVE BIO SURGEON STRL SZ 6.5 (GLOVE) ×4 IMPLANT
GLOVE BIOGEL PI IND STRL 7.0 (GLOVE) ×2 IMPLANT
GLOVE BIOGEL PI INDICATOR 7.0 (GLOVE) ×2
GOWN STRL REUS W/TWL 2XL LVL3 (GOWN DISPOSABLE) ×4 IMPLANT
GOWN STRL REUS W/TWL XL LVL3 (GOWN DISPOSABLE) ×8 IMPLANT
GRASPER ENDOPATH ANVIL 10MM (MISCELLANEOUS) IMPLANT
HOLDER FOLEY CATH W/STRAP (MISCELLANEOUS) ×2 IMPLANT
IRRIG SUCT STRYKERFLOW 2 WTIP (MISCELLANEOUS) ×2
IRRIGATION SUCT STRKRFLW 2 WTP (MISCELLANEOUS) ×1 IMPLANT
LUBRICANT JELLY K Y 4OZ (MISCELLANEOUS) ×2 IMPLANT
PACK COLON (CUSTOM PROCEDURE TRAY) ×2 IMPLANT
PAD POSITIONING PINK XL (MISCELLANEOUS) ×2 IMPLANT
PORT LAP GEL ALEXIS MED 5-9CM (MISCELLANEOUS) ×2 IMPLANT
POSITIONER SURGICAL ARM (MISCELLANEOUS) ×2 IMPLANT
RTRCTR WOUND ALEXIS 18CM MED (MISCELLANEOUS)
SCISSORS LAP 5X35 DISP (ENDOMECHANICALS) ×2 IMPLANT
SEALER TISSUE G2 STRG ARTC 35C (ENDOMECHANICALS) ×2 IMPLANT
SEALER TISSUE X1 CVD JAW (INSTRUMENTS) IMPLANT
SLEEVE XCEL OPT CAN 5 100 (ENDOMECHANICALS) ×2 IMPLANT
SPONGE DRAIN TRACH 4X4 STRL 2S (GAUZE/BANDAGES/DRESSINGS) IMPLANT
SPONGE LAP 18X18 X RAY DECT (DISPOSABLE) IMPLANT
STAPLER CIRC ILS CVD 33MM 37CM (STAPLE) ×2 IMPLANT
STAPLER VISISTAT 35W (STAPLE) IMPLANT
SUT ETHILON 2 0 PS N (SUTURE) IMPLANT
SUT NOVA NAB DX-16 0-1 5-0 T12 (SUTURE) ×2 IMPLANT
SUT NOVA NAB GS-21 0 18 T12 DT (SUTURE) ×4 IMPLANT
SUT PDS AB 1 CTX 36 (SUTURE) IMPLANT
SUT PDS AB 1 TP1 96 (SUTURE) IMPLANT
SUT PROLENE 2 0 KS (SUTURE) ×2 IMPLANT
SUT PROLENE 2 0 SH DA (SUTURE) ×2 IMPLANT
SUT SILK 2 0 (SUTURE) ×1
SUT SILK 2 0 SH CR/8 (SUTURE) ×2 IMPLANT
SUT SILK 2-0 18XBRD TIE 12 (SUTURE) ×1 IMPLANT
SUT SILK 3 0 (SUTURE) ×1
SUT SILK 3 0 SH CR/8 (SUTURE) ×2 IMPLANT
SUT SILK 3-0 18XBRD TIE 12 (SUTURE) ×1 IMPLANT
SUT VIC AB 2-0 SH 18 (SUTURE) ×2 IMPLANT
SUT VIC AB 2-0 SH 27 (SUTURE) ×1
SUT VIC AB 2-0 SH 27X BRD (SUTURE) ×1 IMPLANT
SUT VIC AB 4-0 PS2 27 (SUTURE) ×2 IMPLANT
SYS LAPSCP GELPORT 120MM (MISCELLANEOUS)
SYSTEM LAPSCP GELPORT 120MM (MISCELLANEOUS) IMPLANT
TAPE CLOTH SURG 4X10 WHT LF (GAUZE/BANDAGES/DRESSINGS) ×2 IMPLANT
TOWEL OR NON WOVEN STRL DISP B (DISPOSABLE) ×2 IMPLANT
TRAY FOLEY W/METER SILVER 16FR (SET/KITS/TRAYS/PACK) IMPLANT
TROCAR BLADELESS OPT 5 100 (ENDOMECHANICALS) ×2 IMPLANT
TROCAR XCEL BLUNT TIP 100MML (ENDOMECHANICALS) IMPLANT
TUBING CONNECTING 10 (TUBING) ×2 IMPLANT
TUBING INSUF HEATED (TUBING) ×2 IMPLANT

## 2018-02-09 NOTE — Transfer of Care (Signed)
Immediate Anesthesia Transfer of Care Note  Patient: James Doyle  Procedure(s) Performed: LAPAROSCOPIC COLOSTOMY REVERSAL ERAS PATHWAY (N/A Abdomen)  Patient Location: PACU  Anesthesia Type:General  Level of Consciousness: awake, alert  and oriented  Airway & Oxygen Therapy: Patient Spontanous Breathing and Patient connected to face mask oxygen  Post-op Assessment: Report given to RN and Post -op Vital signs reviewed and stable  Post vital signs: Reviewed and stable  Last Vitals:  Vitals:   02/09/18 1014  BP: (!) 140/94  Pulse: 82  Resp: 18  Temp: 36.8 C  SpO2: 98%    Last Pain:  Vitals:   02/09/18 1014  TempSrc: Oral      Patients Stated Pain Goal: 3 (13/24/40 1027)  Complications: No apparent anesthesia complications

## 2018-02-09 NOTE — H&P (Signed)
The patient is a 72 year old male who presents with colorectal cancer. The patient is being seen for initial consultation for Stage III colon cancer. 72 year old male who presented emergently to the hospital in July with an obstructing colon cancer. He underwent a laparoscopic assisted left hemicolectomy for a colon cancer of the splenic flexure. Given that he was obstructed, he required a colostomy. He has been doing well at home. He is tolerating a diet and having regular bowel function. He has completed a 6 month course of Xeloda adjuvant chemotherapy.   Problem List/Past Medical   COLON CANCER METASTASIZED TO MESENTERIC LYMPH NODES (C18.9)  Past Surgical History  Colon Polyp Removal - Colonoscopy Knee Surgery Right. Spinal Surgery - Lower Back Spinal Surgery Midback Tonsillectomy  Diagnostic Studies History  Colonoscopy 1-5 years ago  Allergies  No Known Drug Allergies [06/08/2017]:  Medication History  Tylenol (500MG  Capsule, Oral) Active.   Social History  Caffeine use Tea. No alcohol use No drug use Tobacco use Never smoker.  Family History  Alcohol Abuse Father. Bleeding disorder Mother. Cerebrovascular Accident Father. Colon Polyps Brother. Diabetes Mellitus Brother. Heart Disease Brother, Mother. Heart disease in male family member before age 29 Heart disease in male family member before age 41 Hypertension Brother, Father, Mother. Migraine Headache Brother, Daughter.  Other Problems  Arthritis Back Pain Hemorrhoids Other disease, cancer, significant illness     Review of Systems  General Present- Appetite Loss and Fatigue. Not Present- Chills, Fever, Night Sweats, Weight Gain and Weight Loss. Skin Not Present- Change in Wart/Mole, Dryness, Hives, Jaundice, New Lesions, Non-Healing Wounds, Rash and Ulcer. HEENT Present- Seasonal Allergies and Wears glasses/contact lenses. Not Present- Earache, Hearing  Loss, Hoarseness, Nose Bleed, Oral Ulcers, Ringing in the Ears, Sinus Pain, Sore Throat, Visual Disturbances and Yellow Eyes. Respiratory Not Present- Bloody sputum, Chronic Cough, Difficulty Breathing, Snoring and Wheezing. Breast Not Present- Breast Mass, Breast Pain, Nipple Discharge and Skin Changes. Cardiovascular Present- Leg Cramps. Not Present- Chest Pain, Difficulty Breathing Lying Down, Palpitations, Rapid Heart Rate, Shortness of Breath and Swelling of Extremities. Gastrointestinal Present- Abdominal Pain, Bloating, Change in Bowel Habits, Constipation, Excessive gas, Gets full quickly at meals, Indigestion and Nausea. Not Present- Bloody Stool, Chronic diarrhea, Difficulty Swallowing, Hemorrhoids, Rectal Pain and Vomiting. Male Genitourinary Not Present- Blood in Urine, Change in Urinary Stream, Frequency, Impotence, Nocturia, Painful Urination, Urgency and Urine Leakage. Musculoskeletal Present- Back Pain, Joint Pain and Joint Stiffness. Not Present- Muscle Pain, Muscle Weakness and Swelling of Extremities. Neurological Not Present- Decreased Memory, Fainting, Headaches, Numbness, Seizures, Tingling, Tremor, Trouble walking and Weakness. Psychiatric Not Present- Anxiety, Bipolar, Change in Sleep Pattern, Depression, Fearful and Frequent crying. Endocrine Not Present- Cold Intolerance, Excessive Hunger, Hair Changes, Heat Intolerance and New Diabetes. Hematology Not Present- Blood Thinners, Easy Bruising, Excessive bleeding, Gland problems, HIV and Persistent Infections.  BP (!) 140/94   Pulse 82   Temp 98.2 F (36.8 C) (Oral)   Resp 18   Ht 6\' 1"  (1.854 m)   Wt 108.9 kg (240 lb)   SpO2 98%   BMI 31.66 kg/m     Physical Exam   The physical exam findings are as follows: Note:GENERAL APPEARANCE: Ashen, ill appearing male. Pleasant and cooperative.   EYES external: conjunctiva, lids, sclerae normal pupils: equal, round glasses: no  NECK: Supple, no obvious mass  or thyroid mass/enlargement, no trachea deviation  CV ascultation: RRR, no murmur extremity edema: no  RESP/CHEST auscultation: breath sounds equal and clear respiratory effort: normal  GASTROINTESTINAL abdomen: Soft, mild RLQ tenderness, with distension, no masses, hypoactive bowel sounds liver and spleen: not enlarged. hernia: none present ostomy: pink and viable scar: healing well  LYMPHATIC: No palpable cervical, supraclavicular adenopathy.  SKIN jaundice: none rash or lesion: none  NEUROLOGIC speech: normal  PSYCHIATRIC alertness and orientation: normal mood/affect/behavior: normal judgement and insight: normal    Assessment & Plan   COLON CANCER METASTASIZED TO MESENTERIC LYMPH NODES (C18.9) Impression: 72 year old male status post Hartman's resection for obstructing colon cancer in July 2018. He has completed her adjuvant lota therapy and is now in surveillance. He is ready for his colostomy reversal. We'll plan on doing this laparoscopically assisted. We have discussed this in detail. If he does have signs of carcinomatosis, we have discussed that we will not reverse his colostomy. The surgery and anatomy were described to the patient as well as the risks of surgery and the possible complications. These include: Bleeding, deep abdominal infections and possible wound complications such as hernia and infection, damage to adjacent structures, leak of surgical connections, which can lead to other surgeries and possibly an ostomy, possible need for other procedures, such as abscess drains in radiology, possible prolonged hospital stay, possible diarrhea from removal of part of the colon, possible constipation from narcotics, possible bowel, bladder or sexual dysfunction if having rectal surgery, prolonged fatigue/weakness or appetite loss, possible early recurrence of of disease, possible complications of their medical problems such as heart disease  or arrhythmias or lung problems, death (less than 1%). I believe the patient understands and wishes to proceed with the surgery.

## 2018-02-09 NOTE — Anesthesia Procedure Notes (Signed)
Procedure Name: Intubation Date/Time: 02/09/2018 12:48 PM Performed by: [place] Skylen Spiering C, CRNA Pre-anesthesia Checklist: Patient identified, Emergency Drugs available, Suction available and Patient being monitored Patient Re-evaluated:Patient Re-evaluated prior to induction Oxygen Delivery Method: Circle system utilized Preoxygenation: Pre-oxygenation with 100% oxygen Induction Type: IV induction Ventilation: Mask ventilation without difficulty Laryngoscope Size: Glidescope, Mac and 4 Grade View: Grade I Tube type: Oral Tube size: 7.5 mm Number of attempts: 1 Airway Equipment and Method: Stylet and Rigid stylet Placement Confirmation: ETT inserted through vocal cords under direct vision,  positive ETCO2 and breath sounds checked- equal and bilateral Secured at: 22 cm Tube secured with: Tape Dental Injury: Teeth and Oropharynx as per pre-operative assessment  Comments: Elective 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  VC normal in appearance.  Smooth and atraumatic

## 2018-02-09 NOTE — Op Note (Signed)
02/09/2018  3:01 PM  PATIENT:  James Doyle  72 y.o. male  Patient Care Team: Alroy Dust, L.Marlou Sa, MD as PCP - General (Family Medicine) Leighton Ruff, MD as Consulting Physician (General Surgery) Truitt Merle, MD as Consulting Physician (Hematology) Wonda Horner, MD as Consulting Physician (Gastroenterology)  PRE-OPERATIVE DIAGNOSIS:  colon cancer, colostomy in place  POST-OPERATIVE DIAGNOSIS:  colon cancer, colostomy in place  PROCEDURE:   LAPAROSCOPIC COLOSTOMY REVERSAL  Surgeon(s): Leighton Ruff, MD Michael Boston, MD Carlena Hurl, PA  ASSISTANT: Dr Johney Maine   ANESTHESIA:   local and general  EBL:  68ml  Delay start of Pharmacological VTE agent (>24hrs) due to surgical blood loss or risk of bleeding:  no  DRAINS: none   SPECIMEN:  Source of Specimen:  colostomy  DISPOSITION OF SPECIMEN:  PATHOLOGY  COUNTS:  YES  PLAN OF CARE: Admit to inpatient   PATIENT DISPOSITION:  PACU - hemodynamically stable.  INDICATION:    72 y.o. 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  He has completed his adjuvant chemotherapy and is no ready for reversal. The anatomy & physiology of the digestive tract was discussed.  The pathophysiology was discussed.  Natural history risks without surgery was discussed.   I worked to give an overview of the disease and the frequent need to have multispecialty involvement.  I feel the risks of no intervention will lead to serious problems that outweigh the operative risks; therefore, I recommended a partial colectomy to remove the pathology.  Laparoscopic & open techniques were discussed.   Risks such as bleeding, infection, abscess, leak, reoperation, possible ostomy, hernia, heart attack, death, and other risks were discussed.  I noted a good likelihood this will help address the problem.   Goals of post-operative recovery were discussed as well.    The patient expressed understanding & wished to proceed with  surgery.  OR FINDINGS:   No obvious metastatic disease on visceral parietal peritoneum or liver.  DESCRIPTION:   Informed consent was confirmed.  The patient underwent general anaesthesia without difficulty.  The patient was positioned appropriately.  VTE prevention in place.  The patient's abdomen was clipped, prepped, & draped in a sterile fashion.  Surgical timeout confirmed our plan.  The patient was positioned in reverse Trendelenburg.  Abdominal entry was gained using a Varies needle in the LUQ.  Entry was clean.  I induced carbon dioxide insufflation.  Camera inspection revealed no injury.  Extra ports were carefully placed under direct laparoscopic visualization.  I began by placing 5 mm ports throughout the right and left abdomen as well as at the umbilicus.  I identified the rectal stump in the pelvis.  Scissors were used to dissect the staple line off of the sacral promontory.  There were a few adhesions in the belly.  A portion of terminal ileum was resected away from the rectum and pelvis using laparoscopic scissors.  There was a portion of small bowel that was attached to the base of the mesentery which was also carefully dissected away.  Once this was completed, the omentum was separated from the colostomy using a laparoscopic Enseal device.  I then decided to take down the colostomy.  An incision was made around the ostomy site using electrocautery.  Dissection was carried down bluntly to the level of the fascia.  The colostomy was separated from the subcutaneous fat using electrocautery and blunt dissection.  Once inside the abdomen the entire colostomy was freed from the fascia using  electrocautery and blunt dissection.  This was then brought out of the abdomen.  The colostomy was transected and the mesentery was divided using the Enseal device.  A 33 mm EEA anvil was then placed into the colon after a 2-0 Prolene pursestring suture was placed.  The pursestring was tied tightly around  this.  This was then placed back into the abdomen.  I continued to mobilize the omentum off of the transverse colon to allow the colon to draped down into the pelvis.  The omentum was mobilized to the right side of the abdomen.  This allowed the colon to reach the rectal stump.  The EEA stapler was introduced into the rectum and brought out through the anterior wall of the rectal stump.  An anastomosis was created.  There was minimal tension.  There was no leak when tested with insufflation under water.  Hemostasis was good.  The small bowel was then brought out over the colonic anastomosis and the omentum was brought down over all of this.  There was a small serosal defect that was closed with a 3-0 silk suture.  Once this was completed, the ports were removed.  The colostomy fascia was closed using interrupted #1 Novafil sutures.  The subcutaneous tissue was reapproximated using 2-0 Vicryl pursestring suture.  The dermal layer of the colostomy was reapproximated using a 2-0 running pursestring suture.  A Telfa wick was placed in the middle.  I then removed my gloves and closed the 5 mm port sites with 4-0 Vicryl suture and Dermabond.  The patient was then awakened from anesthesia and sent to the postanesthesia care unit stable condition.  All counts were correct per operating room staff.   An MD assistant was necessary for tissue manipulation, retraction and positioning due to the complexity of the case and hospital policies

## 2018-02-09 NOTE — Discharge Instructions (Addendum)
ABDOMINAL SURGERY: POST OP INSTRUCTIONS  1. DIET: Follow a light bland diet the first 24 hours after arrival home, such as soup, liquids, crackers, etc.  Be sure to include lots of fluids daily.  Avoid fast food or heavy meals as your are more likely to get nauseated.  Do not eat any uncooked fruits or vegetables for the next 2 weeks as your colon heals. 2. Take your usually prescribed home medications unless otherwise directed. 3. PAIN CONTROL: a. Pain is best controlled by a usual combination of three different methods TOGETHER: i. Ice/Heat ii. Over the counter pain medication iii. Prescription pain medication b. Most patients will experience some swelling and bruising around the incisions.  Ice packs or heating pads (30-60 minutes up to 6 times a day) will help. Use ice for the first few days to help decrease swelling and bruising, then switch to heat to help relax tight/sore spots and speed recovery.  Some people prefer to use ice alone, heat alone, alternating between ice & heat.  Experiment to what works for you.  Swelling and bruising can take several weeks to resolve.   c. It is helpful to take an over-the-counter pain medication regularly for the first few weeks.  Choose one of the following that works best for you: i. Naproxen (Aleve, etc)  Two 220mg  tabs twice a day ii. Ibuprofen (Advil, etc) Three 200mg  tabs four times a day (every meal & bedtime) iii. Acetaminophen (Tylenol, etc) 500-650mg  four times a day (every meal & bedtime) d. A  prescription for pain medication (such as oxycodone, hydrocodone, etc) should be given to you upon discharge.  Take your pain medication as prescribed.  i. If you are having problems/concerns with the prescription medicine (does not control pain, nausea, vomiting, rash, itching, etc), please call us [phone] to see if we need to switch you to a different pain medicine that will work better for you and/or control your side effect better. ii. If you  need a refill on your pain medication, please contact your pharmacy.  They will contact our office to request authorization. Prescriptions will not be filled after 5 pm or on week-ends. 4. Avoid getting constipated.  Between the surgery and the pain medications, it is common to experience some constipation.  Increasing fluid intake and taking a fiber supplement (such as Metamucil, Citrucel, FiberCon, MiraLax, etc) 1-2 times a day regularly will usually help prevent this problem from occurring.  A mild laxative (prune juice, Milk of Magnesia, MiraLax, etc) should be taken according to package directions if there are no bowel movements after 48 hours.   5. Watch out for diarrhea.  If you have many loose bowel movements, simplify your diet to bland foods & liquids for a few days.  Stop any stool softeners and decrease your fiber supplement.  Switching to mild anti-diarrheal medications (Kayopectate, Pepto Bismol) can help.  If this worsens or does not improve, please call us. 6. Wash / shower every day.  You may shower over the incision / wound.  Avoid baths until the skin is fully healed.  Change your dressing daily.  You may wash it with soap and water in the shower.  Continue dressing changes until your follow up apt.  7. ACTIVITIES as tolerated:   a. You may resume regular (light) daily activities beginning the next day--such as daily self-care, walking, climbing stairs--gradually increasing activities as tolerated.  If you can walk 30 minutes without difficulty, it is safe to try more intense  activity such as jogging, treadmill, bicycling, low-impact aerobics, swimming, etc. b. Save the most intensive and strenuous activity for last such as sit-ups, heavy lifting, contact sports, etc  Refrain from any heavy lifting or straining until you are off narcotics for pain control.   c. DO NOT PUSH THROUGH PAIN.  Let pain be your guide: If it hurts to do something, don't do it.  Pain is your body warning you to  avoid that activity for another week until the pain goes down. d. You may drive when you are no longer taking prescription pain medication, you can comfortably wear a seatbelt, and you can safely maneuver your car and apply brakes. e. Dennis Bast may have sexual intercourse when it is comfortable.  8. FOLLOW UP in our office a. Please call [facility] at [phone] to set up an appointment to see your surgeon in the office for a follow-up appointment approximately 1-2 weeks after your surgery. b. Make sure that you call for this appointment the day you arrive home to insure a convenient appointment time. 10. IF YOU HAVE DISABILITY OR FAMILY LEAVE FORMS, BRING THEM TO THE OFFICE FOR PROCESSING.  DO NOT GIVE THEM TO YOUR DOCTOR.   WHEN TO CALL us [phone]: 1. Poor pain control 2. Reactions / problems with new medications (rash/itching, nausea, etc)  3. Fever over 101.5 F (38.5 C) 4. Inability to urinate 5. Nausea and/or vomiting 6. Worsening swelling or bruising 7. Continued bleeding from incision. 8. Increased pain, redness, or drainage from the incision  The clinic staff is available to answer your questions during regular business hours (8:30am-5pm).  Please dont hesitate to call and ask to speak to one of our nurses for clinical concerns.   A surgeon from [facility] is always on call at the hospitals   If you have a medical emergency, go to the nearest emergency room or call 911.    [facility], [place], [place], [place], [place]  [address] ? MAIN: [phone] ? TOLL FREE: [phone] ? FAX [phone] [email or web address]

## 2018-02-09 NOTE — Anesthesia Preprocedure Evaluation (Signed)
Anesthesia Evaluation  Patient identified by MRN, date of birth, ID band Patient awake    Reviewed: Allergy & Precautions, H&P , Patient's Chart, lab work & pertinent test results, reviewed documented beta blocker date and time   Airway Mallampati: II  TM Distance: >3 FB Neck ROM: full    Dental no notable dental hx.    Pulmonary    Pulmonary exam normal breath sounds clear to auscultation       Cardiovascular Exercise Tolerance: Good  Rhythm:regular Rate:Normal     Neuro/Psych    GI/Hepatic Neg liver ROS, Colon mass c obstruction   Endo/Other    Renal/GU negative Renal ROS     Musculoskeletal  (+) Arthritis ,   Abdominal (+) + obese,   Peds  Hematology   Anesthesia Other Findings   Reproductive/Obstetrics                             Anesthesia Physical  Anesthesia Plan  ASA: III  Anesthesia Plan: General   Post-op Pain Management:    Induction: Intravenous  PONV Risk Score and Plan: 4 or greater and Ondansetron, Dexamethasone, Propofol, Midazolam and Treatment may vary due to age or medical condition  Airway Management Planned: Oral ETT and Video Laryngoscope Planned  Additional Equipment:   Intra-op Plan:   Post-operative Plan: Extubation in OR  Informed Consent: I have reviewed the patients History and Physical, chart, labs and discussed the procedure including the risks, benefits and alternatives for the proposed anesthesia with the patient or authorized representative who has indicated his/her understanding and acceptance.   Dental Advisory Given  Plan Discussed with: CRNA  Anesthesia Plan Comments: (H/O vocal cord paralysis)        Anesthesia Quick Evaluation

## 2018-02-10 LAB — CBC
HCT: 40.3 % (ref 39.0–52.0)
Hemoglobin: 13.1 g/dL (ref 13.0–17.0)
MCH: 30 pg (ref 26.0–34.0)
MCHC: 32.5 g/dL (ref 30.0–36.0)
MCV: 92.2 fL (ref 78.0–100.0)
Platelets: 170 10*3/uL (ref 150–400)
RBC: 4.37 MIL/uL (ref 4.22–5.81)
RDW: 13.8 % (ref 11.5–15.5)
WBC: 21.7 10*3/uL — ABNORMAL HIGH (ref 4.0–10.5)

## 2018-02-10 LAB — BASIC METABOLIC PANEL
Anion gap: 10 (ref 5–15)
BUN: 16 mg/dL (ref 6–20)
CHLORIDE: 101 mmol/L (ref 101–111)
CO2: 26 mmol/L (ref 22–32)
CREATININE: 1.24 mg/dL (ref 0.61–1.24)
Calcium: 8.8 mg/dL — ABNORMAL LOW (ref 8.9–10.3)
GFR calc Af Amer: >60 mL/min (ref >60)
GFR calc non Af Amer: 56 mL/min — ABNORMAL LOW (ref >60)
GLUCOSE: 147 mg/dL — AB (ref 65–99)
Potassium: 4.1 mmol/L (ref 3.5–5.1)
SODIUM: 137 mmol/L (ref 135–145)

## 2018-02-10 NOTE — Progress Notes (Signed)
1 Day Post-Op lap colostomy reversal Subjective: Doing well.  Passing some flatus.  No nausea.  tolerating clears  Objective: Vital signs in last 24 hours: Temp:  [97.4 F (36.3 C)-98.8 F (37.1 C)] 98.8 F (37.1 C) (03/14 0611) Pulse Rate:  [64-82] 70 (03/14 0611) Resp:  [11-18] 16 (03/14 0611) BP: (131-168)/(66-94) 131/66 (03/14 0611) SpO2:  [95 %-100 %] 97 % (03/14 0611) Weight:  [108.9 kg (240 lb)-111.4 kg (245 lb 9.5 oz)] 111.4 kg (245 lb 9.5 oz) (03/14 0639)   Intake/Output from previous day: 03/13 0701 - 03/14 0700 In: 2600 [P.O.:300; I.V.:2300] Out: 2025 [Urine:1950; Blood:75] Intake/Output this shift: Total I/O In: -  Out: 300 [Urine:300]   General appearance: alert and cooperative GI: soft, appropriately tender  Incision: no significant drainage  Lab Results:  Recent Labs    02/10/18 0444  WBC 21.7*  HGB 13.1  HCT 40.3  PLT 170   BMET Recent Labs    02/10/18 0444  NA 137  K 4.1  CL 101  CO2 26  GLUCOSE 147*  BUN 16  CREATININE 1.24  CALCIUM 8.8*   PT/INR No results for input(s): LABPROT, INR in the last 72 hours. ABG No results for input(s): PHART, HCO3 in the last 72 hours.  Invalid input(s): PCO2, PO2  MEDS, Scheduled . acetaminophen  1,000 mg Oral Q6H  . alvimopan  12 mg Oral BID  . enoxaparin (LOVENOX) injection  40 mg Subcutaneous Q24H  . saccharomyces boulardii  250 mg Oral BID    Studies/Results: No results found.  Assessment: s/p Procedure(s): LAPAROSCOPIC COLOSTOMY REVERSAL ERAS PATHWAY Patient Active Problem List   Diagnosis Date Noted  . 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 Colostomy in place [facility]) 06/12/2017  . Cancer of left colon ([place]) 06/08/2017  . OA (osteoarthritis) of knee 07/27/2016    Expected post op course  Plan: Advance diet as tolerated Ambulate Change dressing   LOS: 1 day     .Rosario Adie, [facility], [place]   02/10/2018 8:35  AM

## 2018-02-10 NOTE — Evaluation (Signed)
Physical Therapy Evaluation Patient Details Name: GARET HOOTON MRN: [id number] DOB: 03-28-1946 Today's Date: 02/10/2018   History of Present Illness  72 year old male with PMH of R TKA, colon cancer s/p hemicolectomy with colostomy July 2018 admitted for colostomy reversal 02/09/18  Clinical Impression  Pt is independent with mobility, he ambulated 400' without an assistive device, no loss of balance. From PT standpoint, he is ready to DC home. No further PT indicated, will sign off. Encouraged pt to ambulate in halls at least TID.      Follow Up Recommendations No PT follow up    Equipment Recommendations  None recommended by PT    Recommendations for Other Services       Precautions / Restrictions Precautions Precautions: Other (comment) Precaution Comments: abdominal incision Restrictions Weight Bearing Restrictions: No      Mobility  Bed Mobility               General bed mobility comments: up in recliner  Transfers Overall transfer level: Independent Equipment used: None                Ambulation/Gait Ambulation/Gait assistance: Independent Ambulation Distance (Feet): 400 Feet Assistive device: None Gait Pattern/deviations: WFL(Within Functional Limits)     General Gait Details: steady, no loss of balance  Stairs            Wheelchair Mobility    Modified Rankin (Stroke Patients Only)       Balance Overall balance assessment: Independent                                           Pertinent Vitals/Pain Pain Assessment: 0-10 Pain Score: 2  Pain Location: abdominal incision Pain Descriptors / Indicators: Aching Pain Intervention(s): Limited activity within patient's tolerance;Monitored during session;Premedicated before session    Home Living Family/patient expects to be discharged to:: Private residence Living Arrangements: Spouse/significant other Available Help at Discharge: Family Type of Home: House Home  Access: Level entry     Home Layout: One level Home Equipment: Kasandra Knudsen - single point      Prior Function                 Hand Dominance        Extremity/Trunk Assessment   Upper Extremity Assessment Upper Extremity Assessment: Overall WFL for tasks assessed    Lower Extremity Assessment Lower Extremity Assessment: Overall WFL for tasks assessed    Cervical / Trunk Assessment Cervical / Trunk Assessment: Normal  Communication   Communication: HOH  Cognition Arousal/Alertness: Awake/alert Behavior During Therapy: WFL for tasks assessed/performed Overall Cognitive Status: Within Functional Limits for tasks assessed                                        General Comments      Exercises     Assessment/Plan    PT Assessment Patent does not need any further PT services  PT Problem List         PT Treatment Interventions      PT Goals (Current goals can be found in the Care Plan section)  Acute Rehab PT Goals PT Goal Formulation: All assessment and education complete, DC therapy    Frequency     Barriers to discharge  Co-evaluation               AM-PAC PT "6 Clicks" Daily Activity  Outcome Measure Difficulty turning over in bed (including adjusting bedclothes, sheets and blankets)?: None Difficulty moving from lying on back to sitting on the side of the bed? : None Difficulty sitting down on and standing up from a chair with arms (e.g., wheelchair, bedside commode, etc,.)?: None Help needed moving to and from a bed to chair (including a wheelchair)?: None Help needed walking in hospital room?: None Help needed climbing 3-5 steps with a railing? : None 6 Click Score: 24    End of Session   Activity Tolerance: Patient tolerated treatment well Patient left: in chair;with call bell/phone within reach;with family/visitor present Nurse Communication: Mobility status      Time: 3403-5248 PT Time Calculation (min) (ACUTE  ONLY): 12 min   Charges:   PT Evaluation $PT Eval Low Complexity: 1 Low     PT G Codes:          Philomena Doheny 02/10/2018, 10:00 AM [phone]

## 2018-02-10 NOTE — Anesthesia Postprocedure Evaluation (Signed)
Anesthesia Post Note  Patient: James Doyle  Procedure(s) Performed: LAPAROSCOPIC COLOSTOMY REVERSAL ERAS PATHWAY (N/A Abdomen)     Patient location during evaluation: PACU Anesthesia Type: General Level of consciousness: awake and alert Pain management: pain level controlled Vital Signs Assessment: post-procedure vital signs reviewed and stable Respiratory status: spontaneous breathing, nonlabored ventilation, respiratory function stable and patient connected to nasal cannula oxygen Cardiovascular status: blood pressure returned to baseline and stable Postop Assessment: no apparent nausea or vomiting Anesthetic complications: no    Last Vitals:  Vitals:   02/10/18 0142 02/10/18 0611  BP: (!) 149/79 131/66  Pulse: 82 70  Resp: 16 16  Temp: 36.6 C 37.1 C  SpO2: 95% 97%    Last Pain:  Vitals:   02/10/18 0611  TempSrc: Oral  PainSc:                  Riccardo Dubin

## 2018-02-11 ENCOUNTER — Encounter (HOSPITAL_COMMUNITY): Payer: Self-pay | Admitting: Surgery

## 2018-02-11 LAB — CBC
HCT: 38.2 % — ABNORMAL LOW (ref 39.0–52.0)
HEMOGLOBIN: 12.4 g/dL — AB (ref 13.0–17.0)
MCH: 30 pg (ref 26.0–34.0)
MCHC: 32.5 g/dL (ref 30.0–36.0)
MCV: 92.3 fL (ref 78.0–100.0)
Platelets: 152 10*3/uL (ref 150–400)
RBC: 4.14 MIL/uL — AB (ref 4.22–5.81)
RDW: 14 % (ref 11.5–15.5)
WBC: 12.7 10*3/uL — ABNORMAL HIGH (ref 4.0–10.5)

## 2018-02-11 LAB — BASIC METABOLIC PANEL
ANION GAP: 8 (ref 5–15)
BUN: 13 mg/dL (ref 6–20)
CALCIUM: 8.8 mg/dL — AB (ref 8.9–10.3)
CO2: 26 mmol/L (ref 22–32)
Chloride: 106 mmol/L (ref 101–111)
Creatinine, Ser: 1.04 mg/dL (ref 0.61–1.24)
Glucose, Bld: 115 mg/dL — ABNORMAL HIGH (ref 65–99)
Potassium: 3.5 mmol/L (ref 3.5–5.1)
Sodium: 140 mmol/L (ref 135–145)

## 2018-02-11 MED ORDER — TRAMADOL HCL 50 MG PO TABS: 50.0000 mg | ORAL_TABLET | Freq: Four times a day (QID) | ORAL | Status: DC | PRN

## 2018-02-11 NOTE — Progress Notes (Signed)
2 Days Post-Op lap colostomy reversal Subjective: Doing well.  Passing some flatus and having some bloody stools.  No nausea.  tolerating soft diet.  Ambulating  Objective: Vital signs in last 24 hours: Temp:  [98.5 F (36.9 C)-99.1 F (37.3 C)] 98.6 F (37 C) (03/15 0546) Pulse Rate:  [57-85] 72 (03/15 0546) Resp:  [15-16] 16 (03/15 0546) BP: (117-149)/(74-88) 149/88 (03/15 0546) SpO2:  [95 %-98 %] 98 % (03/15 0546) Weight:  [106.6 kg (235 lb)] 106.6 kg (235 lb) (03/15 0500)   Intake/Output from previous day: 03/14 0701 - 03/15 0700 In: 1132.3 [P.O.:720; I.V.:412.3] Out: 3850 [Urine:3850] Intake/Output this shift: No intake/output data recorded.   General appearance: alert and cooperative GI: soft, appropriately tender  Incision: no significant drainage  Lab Results:  Recent Labs    02/10/18 0444 02/11/18 0443  WBC 21.7* 12.7*  HGB 13.1 12.4*  HCT 40.3 38.2*  PLT 170 152   BMET Recent Labs    02/10/18 0444 02/11/18 0443  NA 137 140  K 4.1 3.5  CL 101 106  CO2 26 26  GLUCOSE 147* 115*  BUN 16 13  CREATININE 1.24 1.04  CALCIUM 8.8* 8.8*   PT/INR No results for input(s): LABPROT, INR in the last 72 hours. ABG No results for input(s): PHART, HCO3 in the last 72 hours.  Invalid input(s): PCO2, PO2  MEDS, Scheduled . acetaminophen  1,000 mg Oral Q6H  . alvimopan  12 mg Oral BID  . enoxaparin (LOVENOX) injection  40 mg Subcutaneous Q24H  . saccharomyces boulardii  250 mg Oral BID    Studies/Results: No results found.  Assessment: s/p Procedure(s): LAPAROSCOPIC COLOSTOMY REVERSAL ERAS PATHWAY Patient Active Problem List   Diagnosis Date Noted  . 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 Colostomy in place [facility]) 06/12/2017  . Cancer of left colon ([place]) 06/08/2017  . OA (osteoarthritis) of knee 07/27/2016    Expected post op course  Plan: Ambulate Change dressing BID SL IVF's D/c tom   LOS: 2 days     .Rosario Adie, [facility], [place]   02/11/2018 9:00 AM

## 2018-02-11 NOTE — Progress Notes (Signed)
Pharmacy Brief Note - Alvimopan (Entereg)  The standing order set for alvimopan (Entereg) now includes an automatic order to discontinue the drug after the patient has had a bowel movement. The change was approved by the [place] and the Medical Executive Committee.   This patient has had bowel movement(s) documented in chart. Therefore, alvimopan has been discontinued. If there are questions, please contact the pharmacy at [phone].   Thank you- Clayburn Pert, PharmD, BCPS 02/11/2018  12:27 PM

## 2018-02-12 LAB — BASIC METABOLIC PANEL
ANION GAP: 10 (ref 5–15)
BUN: 11 mg/dL (ref 6–20)
CO2: 29 mmol/L (ref 22–32)
Calcium: 9.4 mg/dL (ref 8.9–10.3)
Chloride: 103 mmol/L (ref 101–111)
Creatinine, Ser: 1.1 mg/dL (ref 0.61–1.24)
GFR calc Af Amer: >60 mL/min (ref >60)
GFR calc non Af Amer: >60 mL/min (ref >60)
GLUCOSE: 108 mg/dL — AB (ref 65–99)
Potassium: 3.5 mmol/L (ref 3.5–5.1)
Sodium: 142 mmol/L (ref 135–145)

## 2018-02-12 LAB — CBC
HEMATOCRIT: 43.5 % (ref 39.0–52.0)
HEMOGLOBIN: 13.8 g/dL (ref 13.0–17.0)
MCH: 29.7 pg (ref 26.0–34.0)
MCHC: 31.7 g/dL (ref 30.0–36.0)
MCV: 93.8 fL (ref 78.0–100.0)
Platelets: 190 10*3/uL (ref 150–400)
RBC: 4.64 MIL/uL (ref 4.22–5.81)
RDW: 13.9 % (ref 11.5–15.5)
WBC: 13.1 10*3/uL — ABNORMAL HIGH (ref 4.0–10.5)

## 2018-02-12 MED ORDER — HYDROCODONE-ACETAMINOPHEN 5-325 MG PO TABS: 1.0000 | ORAL_TABLET | Freq: Four times a day (QID) | ORAL | 0 refills | Status: DC | PRN

## 2018-02-12 NOTE — Discharge Summary (Signed)
Physician Discharge Summary  Patient ID: James Doyle MRN: [id number] DOB/AGE: Jan 21, 1946 72 y.o.  PCP: Alroy Dust, L.Marlou Sa, MD  Admit date: 02/09/2018 Discharge date: 02/12/2018  Admission Diagnoses:  End colostomy  Discharge Diagnoses:  Post takedown of colostomy  Principal Problem:   Status post end colostomy takedown laparoscopically 02/09/2018 Active Problems:   OA (osteoarthritis) of knee   Cancer of left colon ([facility]CC)   Colon obstruction s/p colectomy/colostomy 06/09/2017   Surgery:  end colostomy takedown laparoscopically 02/09/2018   Discharged Condition: improve  Hospital Course:   Had surgery by Dr. Marcello Moores.  Ostomy wound healing OK and packing out.  Ready for discharge on Saturday./  Incisions OK  Consults: none  Significant Diagnostic Studies: none    Discharge Exam: Blood pressure (!) 146/82, pulse 80, temperature 98.2 F (36.8 C), temperature source Oral, resp. rate 18, height 6\' 1"  (1.854 m), weight 111.7 kg (246 lb 4.1 oz), SpO2 99 %. Ostomy site without evidence of infection and wound open to drainage.    Disposition: Discharge disposition: 01-Home or Self Care       Discharge Instructions    Call MD for:  persistant nausea and vomiting   Complete by:  As directed    Call MD for:  temperature >100.4   Complete by:  As directed    Diet - low sodium heart healthy   Complete by:  As directed    Discharge wound care:   Complete by:  As directed    May shower tomorrow Keep dry dressing over ostomy site and allow to heal secondarily   Increase activity slowly   Complete by:  As directed      Allergies as of 02/12/2018   No Known Allergies     Medication List    TAKE these medications   diphenoxylate-atropine 2.5-0.025 MG tablet Commonly known as:  LOMOTIL Take 1 tablet by mouth 4 (four) times daily as needed for diarrhea or loose stools.   HYDROcodone-acetaminophen 5-325 MG tablet Commonly known as:  NORCO/VICODIN Take 1 tablet by mouth every  6 (six) hours as needed for moderate pain.   ibuprofen 200 MG tablet Commonly known as:  ADVIL,MOTRIN Take 400 mg by mouth daily as needed for moderate pain.   lactose free nutrition Liqd Take 237 mLs by mouth 2 (two) times daily.   ondansetron 8 MG tablet Commonly known as:  ZOFRAN Take 1 tablet (8 mg total) by mouth every 8 (eight) hours as needed for nausea or vomiting.   urea 10 % cream Commonly known as:  CARMOL Apply topically as needed.            Discharge Care Instructions  (From admission, onward)        Start     Ordered   02/12/18 0000  Discharge wound care:    Comments:  May shower tomorrow Keep dry dressing over ostomy site and allow to heal secondarily   02/12/18 0837     Follow-up Information    Leighton Ruff, MD. Schedule an appointment as soon as possible for a visit in 2 week(s).   Specialty:  General Surgery Contact information: [address] [place] [address] [phone]           Signed: Pedro Earls 02/12/2018, 8:37 AM

## 2018-03-11 ENCOUNTER — Telehealth: Payer: Self-pay | Admitting: Hematology

## 2018-03-11 NOTE — Telephone Encounter (Signed)
PAL - moved 4/23 lab/fu to 5/6. Not able to reach patient by phone or leave message. Schedule mailed.

## 2018-03-15 ENCOUNTER — Telehealth: Payer: Self-pay | Admitting: Hematology

## 2018-03-15 NOTE — Telephone Encounter (Signed)
Patient called in to reschedule 5/6 app.  Appt moved per his request

## 2018-03-18 ENCOUNTER — Other Ambulatory Visit: Payer: Self-pay | Admitting: Hematology

## 2018-03-18 DIAGNOSIS — R197 Diarrhea, unspecified: Secondary | ICD-10-CM

## 2018-03-24 ENCOUNTER — Ambulatory Visit: Payer: Medicare Other | Admitting: Hematology

## 2018-03-24 ENCOUNTER — Other Ambulatory Visit: Payer: Medicare Other

## 2018-03-25 ENCOUNTER — Other Ambulatory Visit: Payer: Self-pay

## 2018-03-25 DIAGNOSIS — R197 Diarrhea, unspecified: Secondary | ICD-10-CM

## 2018-03-25 MED ORDER — DIPHENOXYLATE-ATROPINE 2.5-0.025 MG PO TABS: 1.0000 | ORAL_TABLET | Freq: Four times a day (QID) | ORAL | 0 refills | Status: DC | PRN

## 2018-03-25 NOTE — Telephone Encounter (Signed)
Patient calls for refill on Lomotil.

## 2018-03-26 ENCOUNTER — Other Ambulatory Visit: Payer: Self-pay | Admitting: Hematology

## 2018-03-26 DIAGNOSIS — R197 Diarrhea, unspecified: Secondary | ICD-10-CM

## 2018-04-04 ENCOUNTER — Ambulatory Visit: Payer: Medicare Other | Admitting: Hematology

## 2018-04-04 ENCOUNTER — Other Ambulatory Visit: Payer: Medicare Other

## 2018-04-14 ENCOUNTER — Encounter: Payer: Self-pay | Admitting: Nurse Practitioner

## 2018-04-14 ENCOUNTER — Inpatient Hospital Stay (HOSPITAL_BASED_OUTPATIENT_CLINIC_OR_DEPARTMENT_OTHER): Payer: Medicare Other | Admitting: Nurse Practitioner

## 2018-04-14 ENCOUNTER — Inpatient Hospital Stay: Payer: Medicare Other | Attending: Hematology

## 2018-04-14 ENCOUNTER — Telehealth: Payer: Self-pay | Admitting: Nurse Practitioner

## 2018-04-14 VITALS — BP 156/77 | HR 45 | Temp 98.4°F | Resp 17 | Ht 73.0 in | Wt 242.3 lb

## 2018-04-14 DIAGNOSIS — R197 Diarrhea, unspecified: Secondary | ICD-10-CM | POA: Insufficient documentation

## 2018-04-14 DIAGNOSIS — G620 Drug-induced polyneuropathy: Secondary | ICD-10-CM | POA: Insufficient documentation

## 2018-04-14 DIAGNOSIS — C186 Malignant neoplasm of descending colon: Secondary | ICD-10-CM | POA: Diagnosis present

## 2018-04-14 DIAGNOSIS — R5383 Other fatigue: Secondary | ICD-10-CM | POA: Diagnosis not present

## 2018-04-14 LAB — COMPREHENSIVE METABOLIC PANEL
ALBUMIN: 4 g/dL (ref 3.5–5.0)
ALK PHOS: 87 U/L (ref 40–150)
ALT: 19 U/L (ref 0–55)
ANION GAP: 7 (ref 3–11)
AST: 21 U/L (ref 5–34)
BILIRUBIN TOTAL: 0.5 mg/dL (ref 0.2–1.2)
BUN: 17 mg/dL (ref 7–26)
CALCIUM: 9.6 mg/dL (ref 8.4–10.4)
CO2: 28 mmol/L (ref 22–29)
Chloride: 105 mmol/L (ref 98–109)
Creatinine, Ser: 1.05 mg/dL (ref 0.70–1.30)
GFR calc Af Amer: >60 mL/min (ref >60)
GLUCOSE: 103 mg/dL (ref 70–140)
POTASSIUM: 3.7 mmol/L (ref 3.5–5.1)
Sodium: 140 mmol/L (ref 136–145)
TOTAL PROTEIN: 7.1 g/dL (ref 6.4–8.3)

## 2018-04-14 LAB — CBC WITH DIFFERENTIAL/PLATELET
BASOS ABS: 0 10*3/uL (ref 0.0–0.1)
Basophils Relative: 1 %
Eosinophils Absolute: 0.2 10*3/uL (ref 0.0–0.5)
Eosinophils Relative: 3 %
HEMATOCRIT: 43.2 % (ref 38.4–49.9)
Hemoglobin: 14.3 g/dL (ref 13.0–17.1)
Lymphocytes Relative: 21 %
Lymphs Abs: 1.6 10*3/uL (ref 0.9–3.3)
MCH: 28.7 pg (ref 27.2–33.4)
MCHC: 33.2 g/dL (ref 32.0–36.0)
MCV: 86.3 fL (ref 79.3–98.0)
Monocytes Absolute: 0.6 10*3/uL (ref 0.1–0.9)
Monocytes Relative: 8 %
NEUTROS ABS: 5.2 10*3/uL (ref 1.5–6.5)
Neutrophils Relative %: 67 %
Platelets: 136 10*3/uL — ABNORMAL LOW (ref 140–400)
RBC: 5 MIL/uL (ref 4.20–5.82)
RDW: 12.8 % (ref 11.0–14.6)
WBC: 7.6 10*3/uL (ref 4.0–10.3)

## 2018-04-14 NOTE — Telephone Encounter (Signed)
Appointments scheduled Calendar/letter mailed to patient per 5/16 los

## 2018-04-14 NOTE — Progress Notes (Signed)
[place]  Telephone:([phone]) [phone] [phone]) [phone]  Clinic Follow up Note   Patient Care Team: Alroy Dust, L.Marlou Sa, MD as PCP - General (Family Medicine) Leighton Ruff, MD as Consulting Physician (General Surgery) Truitt Merle, MD as Consulting Physician (Hematology) Wonda Horner, MD as Consulting Physician (Gastroenterology) 04/14/2018  SUMMARY OF ONCOLOGIC HISTORY: Oncology History   Cancer Staging Cancer of left colon [facility]) Staging form: Colon and Rectum, AJCC 8th Edition - Pathologic stage from 06/09/2017: Stage IIIB (pT3, pN1b, cM0) - Signed by Truitt Merle, MD on 07/06/2017       Cancer of left colon ([place])   06/08/2017 Initial Diagnosis    Cancer of left colon ([place])      06/08/2017 Imaging    CT AP W Contrast 06/08/17 IMPRESSION: 1. Mass of the transverse colon near splenic flexure measuring up to 4 cm compatible with colonic adenocarcinoma. No lymphadenopathy by imaging criteria. 2. Proximal dilatation of ascending and transverse colon with mild inflammatory changes likely due to obstruction. 3. 4 mm right middle lobe pulmonary nodule, attention on follow-up recommended. 4. 2 mm right kidney nonobstructing nephrolithiasis.      06/09/2017 Surgery    LAPAROSCOPY DIAGNOSTIC, OPEN LEFT COLECTOMY by Dr. Marcello Moores      06/09/2017 Pathology Results    Diagnosis 06/09/17 Colon, segmental resection for tumor, left - INVASIVE ADENOCARCINOMA, WELL DIFFERENTIATED, SPANNING 5 CM. - TUMOR INVADES THROUGH MUSCULARIS PROPRIA INTO SUBSEROSAL TISSUE. - RESECTION MARGINS ARE NEGATIVE. - METASTATIC CARCINOMA IN TWO OF TWENTY-SEVEN LYMPH NODES (2/27). - SEE ONCOLOGY TABLE.       07/20/2017 Imaging    CT Chest W Contrast IMPRESSION: No evidence of metastatic disease or other acute findings within the thorax.      08/05/2017 - 12/09/2017 Chemotherapy    Xeloda 2584m in am and 20069mpm, 2 weeks on and 1 week off, starting on 08/05/17 and plan for 6 months   Reduced  to 2000 mg (4 tablets) in AM, 1500 mg (3 tablets) in PM with cycle 3, cycle 3 has been held since 08/1017 due to GI toxicity and active infection.       12/27/2017 Imaging    CT AP W Contrast 12/27/17 IMPRESSION: 1. Interval partial colectomy with transverse colostomy and long Hartmann's pouch. 2. No evidence of recurrent or metastatic disease. 3. Too small to characterize left hepatic lobe lesion, similar to on the prior exam. Ongoing stability favors a benign lesion such as a tiny cyst. This could be re-evaluated at follow-up. 4. Left renal lesion which measures greater than fluid density but is decreased in size compared to the prior. This favors a complex cyst but is technically indeterminate. Recommend attention on follow-up. 5.  Aortic Atherosclerosis (ICD10-I70.0). 6. Right nephrolithiasis.     CURRENT THERAPY: Surveillance   INTERVAL HISTORY: Mr. SwSharrareturns with his family for colon cancer surveillance. He is doing well. He underwent colostomy take down per Dr. AlLeighton Ruffn 01/29/75/16He is recovering well, with mild fatigue and frequent BM. Soon after surgery he had diarrhea with urgency to have BM and sensation of incomplete emptying; this has improved. He has variable amounts of loose stool daily, takes 1 lomotil per day. No blood in stool. Good appetite, no nausea or vomiting. Denies pain. Has mild palmar erythema and tingling to fingertips and toes; he feels this is improving slowly "week to week." although he does think his grip strength is slightly reduced and he has occasional difficulty holding guitar pic. He was seen by  Dr. Marcello Moores for post op follow up and was released from f/u per patient.   REVIEW OF SYSTEMS:   Constitutional: Denies fevers, chills or abnormal weight loss (+) fatigue Eyes: Denies blurriness of vision Ears, nose, mouth, throat, and face: Denies mucositis or sore throat Respiratory: Denies cough, dyspnea or wheezes Cardiovascular: Denies  palpitation, chest discomfort or lower extremity swelling Gastrointestinal:  Denies nausea, vomiting, constipation, abdominal pain, hematochezia, heartburn or change in bowel habits (+) frequent loose stool Skin: Denies abnormal skin rashes (+) hand redness Lymphatics: Denies new lymphadenopathy or easy bruising Neurological:Denies numbness or new weaknesses (+) tingling to fingertips and toes, improving (+) decreased grip strength Behavioral/Psych: Mood is stable, no new changes  All other systems were reviewed with the patient and are negative.  MEDICAL HISTORY:  Past Medical History:  Diagnosis Date  . Aortic atherosclerosis ([place]) 12/27/2017   Noted on CT abd/pelvis  . Arthritis   . Cancer of left colon ([place]) 06/08/2017  . Cellulitis of left hand   . Chronic back pain   . Chronic pain of right knee   . Chronic right hip pain   . 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 DDD (degenerative disc disease), lumbar   . H/O vocal cord paralysis    left   . History of hypokalemia   . History of thumb fracture    Left  . Left nephrolithiasis 12/27/2017   Noted on CT abd/pelvis    SURGICAL HISTORY: Past Surgical History:  Procedure Laterality Date  . BACK SURGERY     times 2  . COLON RESECTION    . COLONOSCOPY    . COLOSTOMY    . COLOSTOMY TAKEDOWN N/A 02/09/2018   Procedure: LAPAROSCOPIC COLOSTOMY REVERSAL ERAS PATHWAY;  Surgeon: Leighton Ruff, MD;  Location: [facility];  Service: General;  Laterality: N/A;  . INNER EAR SURGERY     bilat  . LAPAROSCOPY N/A 06/09/2017   Procedure: LAPAROSCOPY DIAGNOSTIC, OPEN  LEFT COLECTOMY;  Surgeon: Leighton Ruff, MD;  Location: [facility];  Service: General;  Laterality: N/A;  . PROSTATE BIOPSY    . TONSILLECTOMY    . TOTAL KNEE ARTHROPLASTY Right 07/27/2016   Procedure: RIGHT TOTAL KNEE ARTHROPLASTY;  Surgeon: Gaynelle Arabian, MD;  Location: [facility];  Service: Orthopedics;  Laterality: Right;    I have reviewed the social  history and family history with the patient and they are unchanged from previous note.  ALLERGIES:  has No Known Allergies.  MEDICATIONS:  Current Outpatient Medications  Medication Sig Dispense Refill  . diphenoxylate-atropine (LOMOTIL) 2.5-0.025 MG tablet Take 1 tablet by mouth 4 (four) times daily as needed for diarrhea or loose stools. 30 tablet 0  . lactose free nutrition (BOOST) LIQD Take 237 mLs by mouth 2 (two) times daily.    . urea (CARMOL) 10 % cream Apply topically as needed. 71 g 0  . HYDROcodone-acetaminophen (NORCO/VICODIN) 5-325 MG tablet Take 1 tablet by mouth every 6 (six) hours as needed for moderate pain. (Patient not taking: Reported on 04/14/2018) 20 tablet 0  . ibuprofen (ADVIL,MOTRIN) 200 MG tablet Take 400 mg by mouth daily as needed for moderate pain.     Marland Kitchen ondansetron (ZOFRAN) 8 MG tablet Take 1 tablet (8 mg total) by mouth every 8 (eight) hours as needed for nausea or vomiting. (Patient not taking: Reported on 04/14/2018) 20 tablet 1   No current facility-administered medications for this visit.     PHYSICAL EXAMINATION: ECOG PERFORMANCE STATUS: 1 - Symptomatic but  completely ambulatory  Vitals:   04/14/18 1157  BP: (!) 156/77  Pulse: (!) 45  Resp: 17  Temp: 98.4 F (36.9 C)  SpO2: 98%   Filed Weights   04/14/18 1157  Weight: 242 lb 4.8 oz (109.9 kg)    GENERAL:alert, no distress and comfortable SKIN: No rash or significant lesions.  Mild palmar erythema EYES: normal, Conjunctiva are pink and non-injected, sclera clear OROPHARYNX:no exudate, no erythema and lips, buccal mucosa, and tongue normal  LYMPH:  no palpable cervical, supraclavicular, axillary, or inguinal lymphadenopathy  LUNGS: clear to auscultation with normal breathing effort HEART: regular rate & rhythm and no murmurs and no lower extremity edema ABDOMEN:abdomen soft, non-tender and normal bowel sounds (+) previous midline vertical surgical incision has healed well.  Recent laparoscopic  and colostomy takedown surgical incisions are closed without erythema or drainage and healing well Musculoskeletal:no cyanosis of digits and no clubbing  NEURO: alert & oriented x 3 with fluent speech, no focal motor/sensory deficits  LABORATORY DATA:  I have reviewed the data as listed CBC Latest Ref Rng & Units 04/14/2018 02/12/2018 02/11/2018  WBC 4.0 - 10.3 K/uL 7.6 13.1(H) 12.7(H)  Hemoglobin 13.0 - 17.1 g/dL 14.3 13.8 12.4(L)  Hematocrit 38.4 - 49.9 % 43.2 43.5 38.2(L)  Platelets 140 - 400 K/uL 136(L) 190 152     CMP Latest Ref Rng & Units 04/14/2018 02/12/2018 02/11/2018  Glucose 70 - 140 mg/dL 103 108(H) 115(H)  BUN 7 - 26 mg/dL _0 Creatinine 0.70 - 1.30 mg/dL 1.05 1.10 1.04  Sodium 136 - 145 mmol/L 140 142 140  Potassium 3.5 - 5.1 mmol/L 3.7 3.5 3.5  Chloride 98 - 109 mmol/L 105 103 106  CO2 22 - 29 mmol/L _1 Calcium 8.4 - 10.4 mg/dL 9.6 9.4 8.8(L)  Total Protein 6.4 - 8.3 g/dL 7.1 - -  Total Bilirubin 0.2 - 1.2 mg/dL 0.5 - -  Alkaline Phos 40 - 150 U/L 87 - -  AST 5 - 34 U/L 21 - -  ALT 0 - 55 U/L 19 - -   CEA (<5.6ng/ml) 06/09/2017: 1.8  07/22/2017: 1.34 10/06/17: 3.00  PATHOLOGY  Diagnosis 06/09/17 Colon, segmental resection for tumor, left - INVASIVE ADENOCARCINOMA, WELL DIFFERENTIATED, SPANNING 5 CM. - TUMOR INVADES THROUGH MUSCULARIS PROPRIA INTO SUBSEROSAL TISSUE. - RESECTION MARGINS ARE NEGATIVE. - METASTATIC CARCINOMA IN TWO OF TWENTY-SEVEN LYMPH NODES (2/27). - SEE ONCOLOGY TABLE. Microscopic Comment COLON AND RECTUM (INCLUDING TRANS-ANAL RESECTION): Specimen: Left colon. Procedure: Segmental resection. Tumor site: Transverse colon. Specimen integrity: Intact. Macroscopic tumor perforation: Not identified. Invasive tumor: Maximum size: 5 cm Histologic type(s): Invasive adenocarcinoma. Histologic grade and differentiation: G1: well differentiated/low grade Type of polyp in which invasive carcinoma arose: N/A. Microscopic extension of  invasive tumor: Tumor invades through muscularis propria into subserosal tissue. Lymph-Vascular invasion: Definitive invasion not identified. Peri-neural invasion: Not identified. Tumor deposit(s) (discontinuous extramural extension): Not identified. Resection margins: Proximal margin: Negative. Distal margin: Negative. Circumferential (radial) (posterior ascending, posterior descending; lateral and posterior mid-rectum; and entire lower 1/3 rectum): Negative. Mesenteric margin (sigmoid and transverse): Negative. Distance closest margin (if all above margins negative): 2 cm (mesenteric). Treatment effect (neo-adjuvant therapy): N/A. Microscopic Comment(continued) Additional polyp(s): None. Non-neoplastic findings: Diverticulosis. Lymph nodes: number examined 27; number positive: 2 Pathologic Staging: pT3, pN1b, pMX Ancillary studies: MSI and MMR will be ordered.    RADIOGRAPHIC STUDIES: I have personally reviewed the radiological images as listed and agreed with the findings in the report. No results  found.   ASSESSMENT & PLAN: NETTIE CROMWELL is a 72 y.o. male who has a history of arthritis.   1. Left colon cancer, invasive adenocarcinoma, G1, pT3N1bM0, stage IIIB, MSI-stable  -Mr. Worthing appears stable.  He completed 4 months of adjuvant Xeloda on 12/09/2017.  Surveillance CT in 11/2017 without evidence of recurrent or metastatic disease. He underwent colostomy takedown per Dr. Marcello Moores on 02/09/2018.  He continues to recover well.  He continues to have mild fatigue, and moderate loose stool, improving overall.  We reviewed symptom management and colon cancer surveillance.  He is clinically doing well, CBC and CMP unremarkable. Physical exam was normal. No concern for recurrence. Due will be due for annual colonoscopy in 05/2018 with Dr. Penelope Coop, patient will call for appointment. Due to T3N1b disease, will obtain surveillance CT in 3 months prior to next f/u.   2.  Diarrhea -He has frequent  loose stool since surgery in 01/2018. Takes 1 lomotil per day. He can increase lomotil PRN, or add imodium and alternate. He does not appear dehydrated, electrolytes and renal function are normal. I encouraged him to remain adequately hydrated.   3.  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 Symptoms are mild, normal strength without sensory deficit. I recommend OTC B complex vitamin for nerve health. If this does not improve, may consider medication such as gabapentin or cymbalta in the future.   4. OA -Monitoring   5.  Malnutrition, weight loss, related to side effects secondary to Xeloda -Weight is stable overall  6.  AKI and hypokalemia, secondary to dehydration from diarrhea and adjuvant chemo -Resolved  PLAN: -Labs reviewed, continue colon cancer surveillance -Surveillance CT in 3 months -Lab and follow-up with Dr. Burr Medico few days after scan -Patient to call Dr. Penelope Coop GI for annual colonoscopy due 06/18/2018 -B complex vitamin for neuropathy -Imodium/Lomotil for diarrhea  All questions were answered. The patient knows to call the clinic with any problems, questions or concerns. No barriers to learning was detected.     Alla Feeling, NP 04/14/18

## 2018-05-02 ENCOUNTER — Other Ambulatory Visit: Payer: Self-pay | Admitting: Hematology

## 2018-05-02 DIAGNOSIS — R197 Diarrhea, unspecified: Secondary | ICD-10-CM

## 2018-06-03 ENCOUNTER — Other Ambulatory Visit: Payer: Self-pay | Admitting: Hematology

## 2018-06-03 DIAGNOSIS — R197 Diarrhea, unspecified: Secondary | ICD-10-CM

## 2018-06-03 MED ORDER — DIPHENOXYLATE-ATROPINE 2.5-0.025 MG PO TABS: ORAL_TABLET | ORAL | 0 refills | Status: DC

## 2018-06-03 NOTE — Telephone Encounter (Signed)
Dr. Burr Medico refilled e-scribed.

## 2018-06-29 ENCOUNTER — Other Ambulatory Visit: Payer: Self-pay | Admitting: Hematology

## 2018-06-29 ENCOUNTER — Other Ambulatory Visit: Payer: Self-pay | Admitting: Nurse Practitioner

## 2018-06-29 DIAGNOSIS — R197 Diarrhea, unspecified: Secondary | ICD-10-CM

## 2018-07-01 ENCOUNTER — Other Ambulatory Visit: Payer: Self-pay | Admitting: Nurse Practitioner

## 2018-07-01 DIAGNOSIS — R197 Diarrhea, unspecified: Secondary | ICD-10-CM

## 2018-07-01 MED ORDER — DIPHENOXYLATE-ATROPINE 2.5-0.025 MG PO TABS: ORAL_TABLET | ORAL | 0 refills | Status: DC

## 2018-07-06 ENCOUNTER — Telehealth: Payer: Self-pay | Admitting: Emergency Medicine

## 2018-07-06 NOTE — Telephone Encounter (Addendum)
Scan scheduled for 8/14 @ 12:30. Pt voiced understanding of this and instructions   ----- Message from Alla Feeling, NP sent at 07/06/2018  3:55 PM EDT ----- Please arrange CT before f/u on 8/15 if possible. It is authorized.  Thanks [employer]

## 2018-07-11 ENCOUNTER — Telehealth: Payer: Self-pay | Admitting: Hematology

## 2018-07-11 NOTE — Telephone Encounter (Signed)
Per 8/12 schedule message moved 8/15 lab to 8/14 to coordinate with scan. F/u to remain 8/15 and no additional lab needed. Spoke with patient he is aware and appointments for 8/14 lab and 8/15 f/u confirmed.

## 2018-07-11 NOTE — Progress Notes (Signed)
[place]  [phone]) [phone] Fax:([phone]) [phone]  Clinic Follow Up Note   Patient Care Team: Alroy Dust, L.Marlou Sa, MD as PCP - General (Family Medicine) Leighton Ruff, MD as Consulting Physician (General Surgery) Truitt Merle, MD as Consulting Physician (Hematology) Wonda Horner, MD as Consulting Physician (Gastroenterology)   Date of Service:  07/14/2018   CHIEF COMPLAINTS:  Follow Up for Colon Cancer  Oncology History   Cancer Staging Cancer of left colon [facility]) Staging form: Colon and Rectum, AJCC 8th Edition - Pathologic stage from 06/09/2017: Stage IIIB (pT3, pN1b, cM0) - Signed by Truitt Merle, MD on 07/06/2017       Cancer of left colon ([place])   06/08/2017 Initial Diagnosis    Cancer of left colon ([place])    06/08/2017 Imaging    CT AP W Contrast 06/08/17 IMPRESSION: 1. Mass of the transverse colon near splenic flexure measuring up to 4 cm compatible with colonic adenocarcinoma. No lymphadenopathy by imaging criteria. 2. Proximal dilatation of ascending and transverse colon with mild inflammatory changes likely due to obstruction. 3. 4 mm right middle lobe pulmonary nodule, attention on follow-up recommended. 4. 2 mm right kidney nonobstructing nephrolithiasis.    06/09/2017 Surgery    LAPAROSCOPY DIAGNOSTIC, OPEN LEFT COLECTOMY by Dr. Marcello Moores    06/09/2017 Pathology Results    Diagnosis 06/09/17 Colon, segmental resection for tumor, left - INVASIVE ADENOCARCINOMA, WELL DIFFERENTIATED, SPANNING 5 CM. - TUMOR INVADES THROUGH MUSCULARIS PROPRIA INTO SUBSEROSAL TISSUE. - RESECTION MARGINS ARE NEGATIVE. - METASTATIC CARCINOMA IN TWO OF TWENTY-SEVEN LYMPH NODES (2/27). - SEE ONCOLOGY TABLE.     07/20/2017 Imaging    CT Chest W Contrast IMPRESSION: No evidence of metastatic disease or other acute findings within the thorax.    08/05/2017 - 12/09/2017 Chemotherapy    Xeloda 258m in am and 20038mpm, 2 weeks on and 1 week off, starting on 08/05/17 and plan  for 6 months   Reduced to 2000 mg (4 tablets) in AM, 1500 mg (3 tablets) in PM with cycle 3, cycle 3 has been held since 08/1017 due to GI toxicity and active infection.     12/27/2017 Imaging    CT AP W Contrast 12/27/17 IMPRESSION: 1. Interval partial colectomy with transverse colostomy and long Hartmann's pouch. 2. No evidence of recurrent or metastatic disease. 3. Too small to characterize left hepatic lobe lesion, similar to on the prior exam. Ongoing stability favors a benign lesion such as a tiny cyst. This could be re-evaluated at follow-up. 4. Left renal lesion which measures greater than fluid density but is decreased in size compared to the prior. This favors a complex cyst but is technically indeterminate. Recommend attention on follow-up. 5.  Aortic Atherosclerosis (ICD10-I70.0). 6. Right nephrolithiasis.    02/09/2018 Surgery    Surgery 02/09/2018: LAPAROSCOPIC COLOSTOMY REVERSAL ERAS PATHWAY with ThLeighton RuffMD    07/01/10/9417maging    IMPRESSION: 1. Interval reversal of the patient's colostomy with reanastomosis. No complicating features. 2. No findings for residual or recurrent tumor, locoregional lymphadenopathy or metastatic disease elsewhere. 3. Stable 5 mm left hepatic lobe lesion, likely benign cysts. 4. Stable 22 mm lesion projecting off the anterior aspect of the left kidney. This is likely a complex cyst but recommend a precontrast sequence of the abdomen when the patient has is next follow-up CT scan. 5. Stable enlarged prostate gland.      HISTORY OF PRESENTING ILLNESS: 07/07/17 James Chroman89.o. male is here because of newly diagnosed cancer of  left colon. He presents to the clinic with his wife, daughter and granddaughter. Referred by Dr. Marcello Moores  He was diagnosed with screening colonoscopy by Dr. Chancy Milroy at [place]. He was due for his colonoscopy: Before that He had intermittent sever stomach pain, and reported abdominal bloating and swelling.  He also had some constipation. He developed bowel obstruction after the colonoscopy and was admitted to hospital. He underwent left segmental colon resection by Dr. Marcello Moores on 06/09/2017.   today he feels fine since surgery. His incision is healing and stoma is well. His appetite is back to normal overall. Sometimes he will lower his intake when he afraid his bag will get too full  His bowel will vary from loose to formed. It's mostly is pasty. No blood in stool since surgery. he empties 2-3 times a day. He denies fever or chills. His activity is better but he still takes a nap when needed.  He plays music/string instruments. He is not to pick up on anything that could strain his stomach. He has tingling in his right hand fingers slightly since surgery. He is now retired and previous work in Office manager and a Writer.  He is HOH and wears hearing Aid. He is somewhat active and tries to walk. His family wanted to clear up the side of which his tumor was on and that delayed his surgery.   He plays music around town several times a week. wife wants to know will he get as much hep from pill as with IV chemo.  His family shares the questions with his chemo options.   Past, for his knee replacement he was on blood thinner and he noticed blood in stool after surgery. He has not been diagnosed with any other significant issues. He takes tylenol when needed. He has arthritis in his hands. Ear surgery due to ear drum burst at 16. 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 His maternal uncle had colon cancer.  CURRENT THERAPY:  Surveillance  INTERVAL HISTORY:  ELDRIDGE MARCOTT presents to the clinic for follow up. He is here with his family members. He complains of indigestion sometimes. He also has diarrhea without Lomotil. He tries to be careful with his diet and not eat anything this worsens his diarrhea. He denies hematochezia or melena. No bloating or abdominal pain. He  is trying to stay active, but his energy level is not very high. 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  His appetite is improving and he is eating better.     MEDICAL HISTORY:  Past Medical History:  Diagnosis Date  . Aortic atherosclerosis ([place]) 12/27/2017   Noted on CT abd/pelvis  . Arthritis   . Cancer of left colon ([place]) 06/08/2017  . Cellulitis of left hand   . Chronic back pain   . Chronic pain of right knee   . Chronic right hip pain   . 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 DDD (degenerative disc disease), lumbar   . H/O vocal cord paralysis    left   . History of hypokalemia   . History of thumb fracture    Left  . Left nephrolithiasis 12/27/2017   Noted on CT abd/pelvis    SURGICAL HISTORY: Past Surgical History:  Procedure Laterality Date  . BACK SURGERY     times 2  . COLON RESECTION    . COLONOSCOPY    . COLOSTOMY    . COLOSTOMY TAKEDOWN N/A 02/09/2018   Procedure:  LAPAROSCOPIC COLOSTOMY REVERSAL ERAS PATHWAY;  Surgeon: Leighton Ruff, MD;  Location: [facility];  Service: General;  Laterality: N/A;  . INNER EAR SURGERY     bilat  . LAPAROSCOPY N/A 06/09/2017   Procedure: LAPAROSCOPY DIAGNOSTIC, OPEN  LEFT COLECTOMY;  Surgeon: Leighton Ruff, MD;  Location: [facility];  Service: General;  Laterality: N/A;  . PROSTATE BIOPSY    . TONSILLECTOMY    . TOTAL KNEE ARTHROPLASTY Right 07/27/2016   Procedure: RIGHT TOTAL KNEE ARTHROPLASTY;  Surgeon: Gaynelle Arabian, MD;  Location: [facility];  Service: Orthopedics;  Laterality: Right;    SOCIAL HISTORY: Social History   Socioeconomic History  . Marital status: Married    Spouse name: Not on file  . Number of children: Not on file  . Years of education: Not on file  . Highest education level: Not on file  Occupational History  . Not on file  Social Needs  . Financial resource strain: Not on file  . Food insecurity:    Worry: Not on file    Inability: Not on file  . Transportation needs:    Medical: Not  on file    Non-medical: Not on file  Tobacco Use  . Smoking status: Never Smoker  . Smokeless tobacco: Never Used  Substance and Sexual Activity  . Alcohol use: No  . Drug use: No  . Sexual activity: Not on file  Lifestyle  . Physical activity:    Days per week: Not on file    Minutes per session: Not on file  . Stress: Not on file  Relationships  . Social connections:    Talks on phone: Not on file    Gets together: Not on file    Attends religious service: Not on file    Active member of club or organization: Not on file    Attends meetings of clubs or organizations: Not on file    Relationship status: Not on file  . Intimate partner violence:    Fear of current or ex partner: Not on file    Emotionally abused: Not on file    Physically abused: Not on file    Forced sexual activity: Not on file  Other Topics Concern  . Not on file  Social History Narrative  . Not on file    FAMILY HISTORY: Family History  Problem Relation Age of Onset  . Heart failure Mother   . Stroke Father   . Cancer Maternal Uncle        colon cancer    ALLERGIES:  has No Known Allergies.  MEDICATIONS:  Current Outpatient Medications  Medication Sig Dispense Refill  . diphenoxylate-atropine (LOMOTIL) 2.5-0.025 MG tablet TAKE 1 TABLET BY MOUTH 4 TIMES DAILY as needed for diarrhea 90 tablet 1   No current facility-administered medications for this visit.     REVIEW OF SYSTEMS:   Constitutional: Denies fevers, chills or abnormal night sweats Eyes: Denies blurriness of vision, double vision or watery eyes Ears, nose, mouth, throat, and face: Denies mucositis or sore throat Respiratory: Denies cough, dyspnea or wheezes Cardiovascular: Denies palpitation, chest discomfort or lower extremity swelling Gastrointestinal:  Denies nausea, heartburn or change in bowel habits (+) mild diarrhea  Skin: Denies abnormal skin rashes  Lymphatics: Denies new lymphadenopathy or easy  bruising Neurological:Denies numbness, new weaknesses (+) tingling on right hands fingers, decrease in gripping, improving Behavioral/Psych: Mood is stable, no new changes  All other systems were reviewed with the patient and are negative.  PHYSICAL EXAMINATION:  ECOG PERFORMANCE STATUS: 1 - Symptomatic but completely ambulatory   BP (!) 144/78 (BP Location: Left Arm, Patient Position: Sitting) Comment: Nurse aware  Pulse (!) 54   Temp 97.8 F (36.6 C) (Oral)   Resp 18   Ht 6' 1"  (1.854 m)   Wt 243 lb 6.4 oz (110.4 kg)   SpO2 99%   BMI 32.11 kg/m    GENERAL:alert, no distress and comfortable SKIN: skin color, texture, turgor are normal, no rashes or significant lesions, (+) mild diffuse erythema on his palms, no skin peeling or cracks, much improved (+) dry and peeling skin on feet EYES: normal, conjunctiva are pink and non-injected, sclera clear OROPHARYNX:no exudate, no erythema and lips, buccal mucosa, and tongue normal  NECK: supple, thyroid normal size, non-tender, without nodularity LYMPH:  no palpable lymphadenopathy in the cervical, axillary or inguinal LUNGS: clear to auscultation and percussion with normal breathing effort HEART: regular rate & rhythm and no murmurs and no lower extremity edema ABDOMEN: abdomen soft, non-tender and normal bowel sounds, (+) colostomy bag, his midline incision has healed well  Musculoskeletal:no cyanosis of digits and no clubbing  PSYCH: alert & oriented x 3 with fluent speech NEURO: no focal motor/sensory deficits  LABORATORY DATA:  I have reviewed the data as listed CBC Latest Ref Rng & Units 07/13/2018 04/14/2018 02/12/2018  WBC 4.0 - 10.3 K/uL 7.9 7.6 13.1(H)  Hemoglobin 13.0 - 17.1 g/dL 14.7 14.3 13.8  Hematocrit 38.4 - 49.9 % 44.4 43.2 43.5  Platelets 140 - 400 K/uL 155 136(L) 190    CMP Latest Ref Rng & Units 07/13/2018 04/14/2018 02/12/2018  Glucose 70 - 99 mg/dL 88 103 108(H)  BUN 8 - 23 mg/dL 14 17 11   Creatinine 0.61 - 1.24  mg/dL 1.12 1.05 1.10  Sodium 135 - 145 mmol/L 142 140 142  Potassium 3.5 - 5.1 mmol/L 4.0 3.7 3.5  Chloride 98 - 111 mmol/L 105 105 103  CO2 22 - 32 mmol/L 26 28 29   Calcium 8.9 - 10.3 mg/dL 9.3 9.6 9.4  Total Protein 6.5 - 8.1 g/dL 7.4 7.1 -  Total Bilirubin 0.3 - 1.2 mg/dL 0.8 0.5 -  Alkaline Phos 38 - 126 U/L 90 87 -  AST 15 - 41 U/L 25 21 -  ALT 0 - 44 U/L 27 19 -   Tumor Marker CEA (<5.6ng/ml) 06/09/2017: 1.8  07/22/2017: 1.34 10/06/17: 3.00 12/27/17: 1.86  Surgery 02/09/2018: LAPAROSCOPIC COLOSTOMY REVERSAL ERAS PATHWAY with Leighton Ruff, MD   PATHOLOGY  02/09/2018 Surgical Pathology Diagnosis Colon, colostomy stoma - COLOCUTANEOUS JUNCTION CONSISTENT WITH OSTOMY SITE - NO MALIGNANCY IDENTIFIED Specimen(s) Obtained: Colon, colostomy stoma Specimen Clinical Information colon cancer [rd] Gross The specimen is received in formalin and consists of a length of colon measuring 3.5 cm. Both ends are opened, and one end displays a thin rim of tan-pink skin. The mucosa at the exposed end is tan-red, hyperemic and slightly nodular. The unexposed mucosa is tan-pink with normal folding. Representative sections are submitted in two cassettes. (KL:ah 02/09/18)  Diagnosis 06/09/17 Colon, segmental resection for tumor, left - INVASIVE ADENOCARCINOMA, WELL DIFFERENTIATED, SPANNING 5 CM. - TUMOR INVADES THROUGH MUSCULARIS PROPRIA INTO SUBSEROSAL TISSUE. - RESECTION MARGINS ARE NEGATIVE. - METASTATIC CARCINOMA IN TWO OF TWENTY-SEVEN LYMPH NODES (2/27). - SEE ONCOLOGY TABLE. Microscopic Comment COLON AND RECTUM (INCLUDING TRANS-ANAL RESECTION): Specimen: Left colon. Procedure: Segmental resection. Tumor site: Transverse colon. Specimen integrity: Intact. Macroscopic tumor perforation: Not identified. Invasive tumor: Maximum size: 5 cm Histologic type(s): Invasive adenocarcinoma. Histologic  grade and differentiation: G1: well differentiated/low grade Type of polyp in which  invasive carcinoma arose: N/A. Microscopic extension of invasive tumor: Tumor invades through muscularis propria into subserosal tissue. Lymph-Vascular invasion: Definitive invasion not identified. Peri-neural invasion: Not identified. Tumor deposit(s) (discontinuous extramural extension): Not identified. Resection margins: Proximal margin: Negative. Distal margin: Negative. Circumferential (radial) (posterior ascending, posterior descending; lateral and posterior mid-rectum; and entire lower 1/3 rectum): Negative. Mesenteric margin (sigmoid and transverse): Negative. Distance closest margin (if all above margins negative): 2 cm (mesenteric). Treatment effect (neo-adjuvant therapy): N/A. Microscopic Comment(continued) Additional polyp(s): None. Non-neoplastic findings: Diverticulosis. Lymph nodes: number examined 27; number positive: 2 Pathologic Staging: pT3, pN1b, pMX Ancillary studies: MSI and MMR will be ordered.   RADIOGRAPHIC STUDIES: I have personally reviewed the radiological images as listed and agreed with the findings in the report.  07/13/2018 CT AP W Contrast IMPRESSION: 1. Interval reversal of the patient's colostomy with reanastomosis. No complicating features. 2. No findings for residual or recurrent tumor, locoregional lymphadenopathy or metastatic disease elsewhere. 3. Stable 5 mm left hepatic lobe lesion, likely benign cysts. 4. Stable 22 mm lesion projecting off the anterior aspect of the left kidney. This is likely a complex cyst but recommend a precontrast sequence of the abdomen when the patient has is next follow-up CT scan. 5. Stable enlarged prostate gland.  CT AP W Contrast 12/27/17 IMPRESSION: 1. Interval partial colectomy with transverse colostomy and long Hartmann's pouch. 2. No evidence of recurrent or metastatic disease. 3. Too small to characterize left hepatic lobe lesion, similar to on the prior exam. Ongoing stability favors a benign lesion  such as a tiny cyst. This could be re-evaluated at follow-up. 4. Left renal lesion which measures greater than fluid density but is decreased in size compared to the prior. This favors a complex cyst but is technically indeterminate. Recommend attention on follow-up. 5.  Aortic Atherosclerosis (ICD10-I70.0). 6. Right nephrolithiasis.  CT Chest WO Contrast 07/20/17 IMPRESSION: No evidence of metastatic disease or other acute findings within the Thorax. Aortic Atherosclerosis (ICD10-I70.0).  CT AP W Contrast 06/08/17 IMPRESSION: 1. Mass of the transverse colon near splenic flexure measuring up to 4 cm compatible with colonic adenocarcinoma. No lymphadenopathy by imaging criteria. 2. Proximal dilatation of ascending and transverse colon with mild inflammatory changes likely due to obstruction. 3. 4 mm right middle lobe pulmonary nodule, attention on follow-up recommended. 4. 2 mm right kidney nonobstructing nephrolithiasis.  ASSESSMENT & PLAN:  PAL SHELL is a 72 y.o. male who has a history of arthritis.   1. Left colon cancer, invasive adenocarcinoma, G1, pT3N1bM0, stage IIIB, MSI-stable  -We reviewed his colon cancer and pathology results. it's stage IIIB with 2 out of 27 positive lymph nodes.  -staging CT abd/pel  Was negative for metastatic disease. -His CT chest was negative for metastasis, I reviewed the results with patient -His tumor was resected completely and he is cancer free now. However, he does  have a high risk of recurrence due to his locally advanced disease.  -we discussed adjuvant chemo is recommend and standard care for stage III colon cancer. So I strongly suggest he consider adjuvant chemotherapy to reduce his risk of recurrence.  -We reviewed his chemo options, including single agent Xeloda or 5-FU for 6 months, or more standard option of iv chemo Oxaliplatin and 5-fu Pump (FOLFOX) every 2 weeks for 3 months.  -Patient has decided to take Xeloda alone, he  declined intravenous chemotherapy. -He has started adjuvant Xeloda 08/05/17, tolerated moderately  well.   -Patient was very reluctant to complete a 60-monthtreatment, we stopped adjuvant chemo after 4 months therapy. -CT AP from 12/27/17 reveals o evidence of recurrent or metastatic disease. I discussed the results with the pt and family. They are very pleased. We will continue surveillance now.  -I discussed that we will follow up closely in the next 2-3 years because this is the highest tim e for risk of recurrence. And then we will follow up less often for the next 5 years -He had colostomy reversal surgery on 02/09/2018 -He is clinically doing well, labs and exam were unremarkable, severely CT abdomen and pelvis from July 13 2018 showed no evidence of recurrence.  I reviewed with patient and his family members. -He is due colonoscopy 07/25/2018 -I encouraged him to continue healthy diet, and try to exercise more to improve his energy level. -F/u and labs in 4 months   2. Diarrhea and fatigue  -Still has mild diarrhea, on Lomotil once daily which controls his diarrhea  -He still has low energy, although able to function well.  I encouraged him to gradually increase his exercise.  3. osteoarthritis  -f/u and monitoring     PLAN:  -Lab and scan reviewed, no evidence of recurrence  -Labs and f/u in 4 months -I will repeat scan in one year -He is due colonoscopy 07/25/2018    All questions were answered. The patient knows to call the clinic with any problems, questions or concerns. I spent 20 minutes counseling the patient face to face. The total time spent in the appointment was 25 minutes and more than 50% was on counseling.  IDierdre SearlesDweik am acting as scribe for Dr. YTruitt Merle  I have reviewed the above documentation for accuracy and completeness, and I agree with the above.      YTruitt Merle MD 07/14/2018

## 2018-07-13 ENCOUNTER — Encounter (HOSPITAL_COMMUNITY): Payer: Self-pay

## 2018-07-13 ENCOUNTER — Inpatient Hospital Stay: Payer: Medicare Other | Attending: Hematology

## 2018-07-13 ENCOUNTER — Ambulatory Visit (HOSPITAL_COMMUNITY)
Admission: RE | Admit: 2018-07-13 | Discharge: 2018-07-13 | Disposition: A | Discharge status: Home / Self Care | Payer: Medicare Other | Source: Ambulatory Visit | Attending: Nurse Practitioner | Admitting: Nurse Practitioner

## 2018-07-13 ENCOUNTER — Telehealth: Payer: Self-pay

## 2018-07-13 DIAGNOSIS — R5383 Other fatigue: Secondary | ICD-10-CM | POA: Insufficient documentation

## 2018-07-13 DIAGNOSIS — M1990 Unspecified osteoarthritis, unspecified site: Secondary | ICD-10-CM | POA: Insufficient documentation

## 2018-07-13 DIAGNOSIS — R197 Diarrhea, unspecified: Secondary | ICD-10-CM | POA: Insufficient documentation

## 2018-07-13 DIAGNOSIS — C186 Malignant neoplasm of descending colon: Secondary | ICD-10-CM

## 2018-07-13 DIAGNOSIS — Z933 Colostomy status: Secondary | ICD-10-CM | POA: Diagnosis not present

## 2018-07-13 DIAGNOSIS — N400 Enlarged prostate without lower urinary tract symptoms: Secondary | ICD-10-CM | POA: Insufficient documentation

## 2018-07-13 DIAGNOSIS — G629 Polyneuropathy, unspecified: Secondary | ICD-10-CM | POA: Diagnosis not present

## 2018-07-13 LAB — CBC WITH DIFFERENTIAL/PLATELET
BASOS ABS: 0 10*3/uL (ref 0.0–0.1)
Basophils Relative: 0 %
Eosinophils Absolute: 0.2 10*3/uL (ref 0.0–0.5)
Eosinophils Relative: 3 %
HEMATOCRIT: 44.4 % (ref 38.4–49.9)
Hemoglobin: 14.7 g/dL (ref 13.0–17.1)
LYMPHS ABS: 2.5 10*3/uL (ref 0.9–3.3)
LYMPHS PCT: 31 %
MCH: 28.6 pg (ref 27.2–33.4)
MCHC: 33.2 g/dL (ref 32.0–36.0)
MCV: 86.1 fL (ref 79.3–98.0)
MONO ABS: 0.7 10*3/uL (ref 0.1–0.9)
Monocytes Relative: 9 %
Neutro Abs: 4.5 10*3/uL (ref 1.5–6.5)
Neutrophils Relative %: 57 %
Platelets: 155 10*3/uL (ref 140–400)
RBC: 5.16 MIL/uL (ref 4.20–5.82)
RDW: 13.7 % (ref 11.0–14.6)
WBC: 7.9 10*3/uL (ref 4.0–10.3)

## 2018-07-13 LAB — COMPREHENSIVE METABOLIC PANEL
ALBUMIN: 4.2 g/dL (ref 3.5–5.0)
ALT: 27 U/L (ref 0–44)
AST: 25 U/L (ref 15–41)
Alkaline Phosphatase: 90 U/L (ref 38–126)
Anion gap: 11 (ref 5–15)
BILIRUBIN TOTAL: 0.8 mg/dL (ref 0.3–1.2)
BUN: 14 mg/dL (ref 8–23)
CHLORIDE: 105 mmol/L (ref 98–111)
CO2: 26 mmol/L (ref 22–32)
Calcium: 9.3 mg/dL (ref 8.9–10.3)
Creatinine, Ser: 1.12 mg/dL (ref 0.61–1.24)
GFR calc Af Amer: >60 mL/min (ref >60)
GFR calc non Af Amer: >60 mL/min (ref >60)
GLUCOSE: 88 mg/dL (ref 70–99)
POTASSIUM: 4 mmol/L (ref 3.5–5.1)
Sodium: 142 mmol/L (ref 135–145)
TOTAL PROTEIN: 7.4 g/dL (ref 6.5–8.1)

## 2018-07-13 MED ORDER — IOHEXOL 300 MG/ML  SOLN
100.0000 mL | Freq: Once | INTRAMUSCULAR | Status: AC | PRN
  Administered 2018-07-13: 100 mL via INTRAVENOUS

## 2018-07-13 NOTE — Telephone Encounter (Signed)
Returned call and spoke with patient to confirm his appointment times per 8/13 vm

## 2018-07-14 ENCOUNTER — Telehealth: Payer: Self-pay

## 2018-07-14 ENCOUNTER — Other Ambulatory Visit: Payer: Medicare Other

## 2018-07-14 ENCOUNTER — Encounter: Payer: Self-pay | Admitting: Hematology

## 2018-07-14 ENCOUNTER — Inpatient Hospital Stay (HOSPITAL_BASED_OUTPATIENT_CLINIC_OR_DEPARTMENT_OTHER): Payer: Medicare Other | Admitting: Hematology

## 2018-07-14 VITALS — BP 144/78 | HR 54 | Temp 97.8°F | Resp 18 | Ht 73.0 in | Wt 243.4 lb

## 2018-07-14 DIAGNOSIS — R197 Diarrhea, unspecified: Secondary | ICD-10-CM

## 2018-07-14 DIAGNOSIS — M1990 Unspecified osteoarthritis, unspecified site: Secondary | ICD-10-CM

## 2018-07-14 DIAGNOSIS — G629 Polyneuropathy, unspecified: Secondary | ICD-10-CM

## 2018-07-14 DIAGNOSIS — C186 Malignant neoplasm of descending colon: Secondary | ICD-10-CM | POA: Diagnosis not present

## 2018-07-14 DIAGNOSIS — R5383 Other fatigue: Secondary | ICD-10-CM

## 2018-07-14 MED ORDER — DIPHENOXYLATE-ATROPINE 2.5-0.025 MG PO TABS: ORAL_TABLET | ORAL | 1 refills | Status: DC

## 2018-07-14 NOTE — Telephone Encounter (Signed)
Printed avs and calender of upcoming appointment. Per 8/15 los 

## 2018-11-09 NOTE — Progress Notes (Signed)
[facility]   Telephone:(336) [phone] Fax:(336) [phone]   Clinic Follow up Note   Patient Care Team: Alroy Dust, L.Marlou Sa, MD as PCP - General (Family Medicine) Leighton Ruff, MD as Consulting Physician (General Surgery) Truitt Merle, MD as Consulting Physician (Hematology) Wonda Horner, MD as Consulting Physician (Gastroenterology) 11/10/2018  CHIEF COMPLAINT: F/u on colon cancer   SUMMARY OF ONCOLOGIC HISTORY: Oncology History   Cancer Staging Cancer of left colon [facility]) Staging form: Colon and Rectum, AJCC 8th Edition - Pathologic stage from 06/09/2017: Stage IIIB (pT3, pN1b, cM0) - Signed by Truitt Merle, MD on 07/06/2017       Cancer of left colon ([place])   06/08/2017 Initial Diagnosis    Cancer of left colon ([place])    06/08/2017 Imaging    CT AP W Contrast 06/08/17 IMPRESSION: 1. Mass of the transverse colon near splenic flexure measuring up to 4 cm compatible with colonic adenocarcinoma. No lymphadenopathy by imaging criteria. 2. Proximal dilatation of ascending and transverse colon with mild inflammatory changes likely due to obstruction. 3. 4 mm right middle lobe pulmonary nodule, attention on follow-up recommended. 4. 2 mm right kidney nonobstructing nephrolithiasis.    06/09/2017 Surgery    LAPAROSCOPY DIAGNOSTIC, OPEN LEFT COLECTOMY by Dr. Marcello Moores    06/09/2017 Pathology Results    Diagnosis 06/09/17 Colon, segmental resection for tumor, left - INVASIVE ADENOCARCINOMA, WELL DIFFERENTIATED, SPANNING 5 CM. - TUMOR INVADES THROUGH MUSCULARIS PROPRIA INTO SUBSEROSAL TISSUE. - RESECTION MARGINS ARE NEGATIVE. - METASTATIC CARCINOMA IN TWO OF TWENTY-SEVEN LYMPH NODES (2/27). - SEE ONCOLOGY TABLE.     07/20/2017 Imaging    CT Chest W Contrast IMPRESSION: No evidence of metastatic disease or other acute findings within the thorax.    08/05/2017 - 12/09/2017 Chemotherapy    Xeloda 2566m in am and 20081mpm, 2 weeks on and 1 week off, starting on 08/05/17 and  plan for 6 months   Reduced to 2000 mg (4 tablets) in AM, 1500 mg (3 tablets) in PM with cycle 3, cycle 3 has been held since 08/1017 due to GI toxicity and active infection.     12/27/2017 Imaging    CT AP W Contrast 12/27/17 IMPRESSION: 1. Interval partial colectomy with transverse colostomy and long Hartmann's pouch. 2. No evidence of recurrent or metastatic disease. 3. Too small to characterize left hepatic lobe lesion, similar to on the prior exam. Ongoing stability favors a benign lesion such as a tiny cyst. This could be re-evaluated at follow-up. 4. Left renal lesion which measures greater than fluid density but is decreased in size compared to the prior. This favors a complex cyst but is technically indeterminate. Recommend attention on follow-up. 5.  Aortic Atherosclerosis (ICD10-I70.0). 6. Right nephrolithiasis.    02/09/2018 Surgery    Surgery 02/09/2018: LAPAROSCOPIC COLOSTOMY REVERSAL ERAS PATHWAY with ThLeighton RuffMD    06/30/94/0932maging    IMPRESSION: 1. Interval reversal of the patient's colostomy with reanastomosis. No complicating features. 2. No findings for residual or recurrent tumor, locoregional lymphadenopathy or metastatic disease elsewhere. 3. Stable 5 mm left hepatic lobe lesion, likely benign cysts. 4. Stable 22 mm lesion projecting off the anterior aspect of the left kidney. This is likely a complex cyst but recommend a precontrast sequence of the abdomen when the patient has is next follow-up CT scan. 5. Stable enlarged prostate gland.     CURRENT THERAPY Surveillance   INTERVAL HISTORY: RoSHIVANK PINEDOs a [age].o. male who is here for follow-up. Today, he  is here with his wife. He is doing well He noticed small hyperpigmented sports on his face. He still plays that guitar and doesn't drop objects. His finger neuropathy is stable. He also complains of arthritis. He says that he injured his left thumb at work in 2010. He said that [facility]  was supposed to compensate him. He has not had his injury evaluated and he has not seen a Copy. He uses lomotil and imodium as needed. He uses them once a day. He had colonoscopy this year.   Pertinent positives and negatives of review of systems are listed and detailed within the above HPI.  REVIEW OF SYSTEMS:   Constitutional: Denies fevers, chills or abnormal weight loss Eyes: Denies blurriness of vision Ears, nose, mouth, throat, and face: Denies mucositis or sore throat Respiratory: Denies cough, dyspnea or wheezes Cardiovascular: Denies palpitation, chest discomfort or lower extremity swelling Gastrointestinal:  Denies nausea, heartburn or change in bowel habits Skin: Denies abnormal skin rashes (+) hyperpigmented skin lesions on face Lymphatics: Denies new lymphadenopathy or easy bruising Neurological: (+) finger neuropathy stable  MSK: (+) arthritis (+) left thumb injury Behavioral/Psych: Mood is stable, no new changes  All other systems were reviewed with the patient and are negative.  MEDICAL HISTORY:  Past Medical History:  Diagnosis Date  . Aortic atherosclerosis ([place]) 12/27/2017   Noted on CT abd/pelvis  . Arthritis   . Cancer of left colon ([place]) 06/08/2017  . Cellulitis of left hand   . Chronic back pain   . Chronic pain of right knee   . Chronic right hip pain   . 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 DDD (degenerative disc disease), lumbar   . H/O vocal cord paralysis    left   . History of hypokalemia   . History of thumb fracture    Left  . Left nephrolithiasis 12/27/2017   Noted on CT abd/pelvis    SURGICAL HISTORY: Past Surgical History:  Procedure Laterality Date  . BACK SURGERY     times 2  . COLON RESECTION    . COLONOSCOPY    . COLOSTOMY    . COLOSTOMY TAKEDOWN N/A 02/09/2018   Procedure: LAPAROSCOPIC COLOSTOMY REVERSAL ERAS PATHWAY;  Surgeon: Leighton Ruff, MD;  Location: [facility];  Service: General;   Laterality: N/A;  . INNER EAR SURGERY     bilat  . LAPAROSCOPY N/A 06/09/2017   Procedure: LAPAROSCOPY DIAGNOSTIC, OPEN  LEFT COLECTOMY;  Surgeon: Leighton Ruff, MD;  Location: [facility];  Service: General;  Laterality: N/A;  . PROSTATE BIOPSY    . TONSILLECTOMY    . TOTAL KNEE ARTHROPLASTY Right 07/27/2016   Procedure: RIGHT TOTAL KNEE ARTHROPLASTY;  Surgeon: Gaynelle Arabian, MD;  Location: [facility];  Service: Orthopedics;  Laterality: Right;    I have reviewed the social history and family history with the patient and they are unchanged from previous note.  ALLERGIES:  has No Known Allergies.  MEDICATIONS:  Current Outpatient Medications  Medication Sig Dispense Refill  . diphenoxylate-atropine (LOMOTIL) 2.5-0.025 MG tablet TAKE 1 TABLET BY MOUTH 4 TIMES DAILY as needed for diarrhea 90 tablet 1   No current facility-administered medications for this visit.     PHYSICAL EXAMINATION: ECOG PERFORMANCE STATUS: 0 - Asymptomatic  Vitals:   11/10/18 1122  BP: (!) 146/78  Pulse: (!) 59  Resp: 16  Temp: 98.6 F (37 C)  SpO2: 98%   Filed Weights   11/10/18 1122  Weight: 244 lb 8  oz (110.9 kg)    GENERAL:alert, no distress and comfortable SKIN: skin color, texture, turgor are normal, no rashes or significant lesions (+) hyperpigmented lesions on face, likely related to sun exposure  EYES: normal, Conjunctiva are pink and non-injected, sclera clear OROPHARYNX:no exudate, no erythema and lips, buccal mucosa, and tongue normal  NECK: supple, thyroid normal size, non-tender, without nodularity LYMPH:  no palpable lymphadenopathy in the cervical, axillary or inguinal LUNGS: clear to auscultation and percussion with normal breathing effort HEART: regular rate & rhythm and no murmurs and no lower extremity edema ABDOMEN:abdomen soft, non-tender and normal bowel sounds Musculoskeletal:no cyanosis of digits and no clubbing  NEURO: alert & oriented x 3 with fluent speech, no focal  motor/sensory deficits  LABORATORY DATA:  I have reviewed the data as listed CBC Latest Ref Rng & Units 11/10/2018 07/13/2018 04/14/2018  WBC 4.0 - 10.5 K/uL 7.6 7.9 7.6  Hemoglobin 13.0 - 17.0 g/dL 15.5 14.7 14.3  Hematocrit 39.0 - 52.0 % 47.2 44.4 43.2  Platelets 150 - 400 K/uL 172 155 136(L)     CMP Latest Ref Rng & Units 11/10/2018 07/13/2018 04/14/2018  Glucose 70 - 99 mg/dL 115(H) 88 103  BUN 8 - 23 mg/dL _0 Creatinine 0.61 - 1.24 mg/dL 1.26(H) 1.12 1.05  Sodium 135 - 145 mmol/L 143 142 140  Potassium 3.5 - 5.1 mmol/L 4.0 4.0 3.7  Chloride 98 - 111 mmol/L 104 105 105  CO2 22 - 32 mmol/L _1 Calcium 8.9 - 10.3 mg/dL 9.5 9.3 9.6  Total Protein 6.5 - 8.1 g/dL 7.6 7.4 7.1  Total Bilirubin 0.3 - 1.2 mg/dL 0.8 0.8 0.5  Alkaline Phos 38 - 126 U/L 87 90 87  AST 15 - 41 U/L _2 ALT 0 - 44 U/L _3 Tumor Marker CEA (<5.6ng/ml) 06/09/2017: 1.8  07/22/2017: 1.34 10/06/17: 3.00 12/27/17: 1.86   RADIOGRAPHIC STUDIES: I have personally reviewed the radiological images as listed and agreed with the findings in the report. No results found.   ASSESSMENT & PLAN:  James Doyle is a 72 y.o. male with history of  1. Left colon cancer, invasive adenocarcinoma, G1, pT3N1bM0, stage IIIB, MSI-stable  -Diagnosed in 05/2017. Treated with hemicolectomy and adjuvant chemo. Currently on surveillance. -He said that he had colonoscopy this year, by Dr. Penelope Coop. -I plan to repeat scan in 6 months. -Labs reviewed, CBC is WNLs CMP pending.  He is clinically doing well, exam was unremarkable, no clinical concern for recurrence. -f/u in 4 months -Repeat scan in 07/2019  2. Diarrhea and fatigue -Currently on Lomotil. Continue as needed. I recommend him to use over-the-counter Imodium, to replace Lomotil -I advised him to watch his diet and try to avoid any foods that worsen his diarrhea.  -I previously encouraged him to exercise.   3. Osteoarthritis -f/u with PCP  Plan  -f/u  in 4 months -repeat scan in 07/2019, will order on next visit   No problem-specific Assessment & Plan notes found for this encounter.   No orders of the defined types were placed in this encounter.  All questions were answered. The patient knows to call the clinic with any problems, questions or concerns. No barriers to learning was detected. I spent 15 minutes counseling the patient face to face. The total time spent in the appointment was 20 minutes and more than 50% was on counseling and review of test results  I, Noor Dweik am acting  as scribe for Dr. Truitt Merle.  I have reviewed the above documentation for accuracy and completeness, and I agree with the above.     Truitt Merle, MD 11/10/2018

## 2018-11-10 ENCOUNTER — Inpatient Hospital Stay (HOSPITAL_BASED_OUTPATIENT_CLINIC_OR_DEPARTMENT_OTHER): Payer: Medicare Other | Admitting: Hematology

## 2018-11-10 ENCOUNTER — Telehealth: Payer: Self-pay | Admitting: Hematology

## 2018-11-10 ENCOUNTER — Inpatient Hospital Stay: Payer: Medicare Other | Attending: Hematology

## 2018-11-10 ENCOUNTER — Encounter: Payer: Self-pay | Admitting: Hematology

## 2018-11-10 VITALS — BP 146/78 | HR 59 | Temp 98.6°F | Resp 16 | Ht 73.0 in | Wt 244.5 lb

## 2018-11-10 DIAGNOSIS — C186 Malignant neoplasm of descending colon: Secondary | ICD-10-CM

## 2018-11-10 DIAGNOSIS — R197 Diarrhea, unspecified: Secondary | ICD-10-CM | POA: Insufficient documentation

## 2018-11-10 DIAGNOSIS — R5383 Other fatigue: Secondary | ICD-10-CM | POA: Insufficient documentation

## 2018-11-10 DIAGNOSIS — M1990 Unspecified osteoarthritis, unspecified site: Secondary | ICD-10-CM | POA: Insufficient documentation

## 2018-11-10 DIAGNOSIS — G629 Polyneuropathy, unspecified: Secondary | ICD-10-CM | POA: Insufficient documentation

## 2018-11-10 LAB — COMPREHENSIVE METABOLIC PANEL
ALK PHOS: 87 U/L (ref 38–126)
ALT: 22 U/L (ref 0–44)
AST: 23 U/L (ref 15–41)
Albumin: 4.2 g/dL (ref 3.5–5.0)
Anion gap: 8 (ref 5–15)
BUN: 15 mg/dL (ref 8–23)
CALCIUM: 9.5 mg/dL (ref 8.9–10.3)
CHLORIDE: 104 mmol/L (ref 98–111)
CO2: 31 mmol/L (ref 22–32)
CREATININE: 1.26 mg/dL — AB (ref 0.61–1.24)
GFR calc Af Amer: >60 mL/min (ref >60)
GFR calc non Af Amer: 56 mL/min — ABNORMAL LOW (ref >60)
GLUCOSE: 115 mg/dL — AB (ref 70–99)
POTASSIUM: 4 mmol/L (ref 3.5–5.1)
Sodium: 143 mmol/L (ref 135–145)
TOTAL PROTEIN: 7.6 g/dL (ref 6.5–8.1)
Total Bilirubin: 0.8 mg/dL (ref 0.3–1.2)

## 2018-11-10 LAB — CBC WITH DIFFERENTIAL/PLATELET
ABS IMMATURE GRANULOCYTES: 0.04 10*3/uL (ref 0.00–0.07)
BASOS ABS: 0 10*3/uL (ref 0.0–0.1)
Basophils Relative: 0 %
Eosinophils Absolute: 0.2 10*3/uL (ref 0.0–0.5)
Eosinophils Relative: 2 %
HEMATOCRIT: 47.2 % (ref 39.0–52.0)
Hemoglobin: 15.5 g/dL (ref 13.0–17.0)
Immature Granulocytes: 1 %
LYMPHS ABS: 2.1 10*3/uL (ref 0.7–4.0)
LYMPHS PCT: 28 %
MCH: 29 pg (ref 26.0–34.0)
MCHC: 32.8 g/dL (ref 30.0–36.0)
MCV: 88.2 fL (ref 80.0–100.0)
MONO ABS: 0.5 10*3/uL (ref 0.1–1.0)
MONOS PCT: 7 %
NEUTROS ABS: 4.8 10*3/uL (ref 1.7–7.7)
NRBC: 0 % (ref 0.0–0.2)
Neutrophils Relative %: 62 %
PLATELETS: 172 10*3/uL (ref 150–400)
RBC: 5.35 MIL/uL (ref 4.22–5.81)
RDW: 12.7 % (ref 11.5–15.5)
WBC: 7.6 10*3/uL (ref 4.0–10.5)

## 2018-11-10 NOTE — Telephone Encounter (Signed)
Printed calendar and avs. °

## 2019-02-23 ENCOUNTER — Telehealth: Payer: Self-pay

## 2019-02-23 ENCOUNTER — Other Ambulatory Visit: Payer: Self-pay | Admitting: Hematology

## 2019-02-23 DIAGNOSIS — R197 Diarrhea, unspecified: Secondary | ICD-10-CM

## 2019-02-23 NOTE — Telephone Encounter (Signed)
Faxed script for Lomotil to [facility] [phone]

## 2019-03-13 ENCOUNTER — Ambulatory Visit: Payer: Medicare Other | Admitting: Hematology

## 2019-03-13 ENCOUNTER — Other Ambulatory Visit: Payer: Medicare Other

## 2019-04-19 ENCOUNTER — Telehealth: Payer: Self-pay | Admitting: Hematology

## 2019-04-19 NOTE — Progress Notes (Signed)
[place]   Telephone:(336) [phone] Fax:(336) [phone]   Clinic Follow up Note   Patient Care Team: Alroy Dust, L.Marlou Sa, MD as PCP - General (Family Medicine) Leighton Ruff, MD as Consulting Physician (General Surgery) Truitt Merle, MD as Consulting Physician (Hematology) Wonda Horner, MD as Consulting Physician (Gastroenterology)   I connected with James Doyle on 04/20/2019 at  1:00 PM EDT by video enabled telemedicine visit and verified that I am speaking with the correct person using two identifiers.  I discussed the limitations, risks, security and privacy concerns of performing an evaluation and management service by telephone and the availability of in person appointments. I also discussed with the patient that there may be a patient responsible charge related to this service. The patient expressed understanding and agreed to proceed.   Other persons participating in the visit and their role in the encounter:  His wife  Patient's location:  His home  Provider's location:  My office  CHIEF COMPLAINT: F/u of colon cancer  SUMMARY OF ONCOLOGIC HISTORY: Oncology History   Cancer Staging Cancer of left colon [facility]) Staging form: Colon and Rectum, AJCC 8th Edition - Pathologic stage from 06/09/2017: Stage IIIB (pT3, pN1b, cM0) - Signed by Truitt Merle, MD on 07/06/2017       Cancer of left colon ([place])   06/08/2017 Initial Diagnosis    Cancer of left colon ([place])    06/08/2017 Imaging    CT AP W Contrast 06/08/17 IMPRESSION: 1. Mass of the transverse colon near splenic flexure measuring up to 4 cm compatible with colonic adenocarcinoma. No lymphadenopathy by imaging criteria. 2. Proximal dilatation of ascending and transverse colon with mild inflammatory changes likely due to obstruction. 3. 4 mm right middle lobe pulmonary nodule, attention on follow-up recommended. 4. 2 mm right kidney nonobstructing nephrolithiasis.    06/09/2017 Surgery    LAPAROSCOPY  DIAGNOSTIC, OPEN LEFT COLECTOMY by Dr. Marcello Moores    06/09/2017 Pathology Results    Diagnosis 06/09/17 Colon, segmental resection for tumor, left - INVASIVE ADENOCARCINOMA, WELL DIFFERENTIATED, SPANNING 5 CM. - TUMOR INVADES THROUGH MUSCULARIS PROPRIA INTO SUBSEROSAL TISSUE. - RESECTION MARGINS ARE NEGATIVE. - METASTATIC CARCINOMA IN TWO OF TWENTY-SEVEN LYMPH NODES (2/27). - SEE ONCOLOGY TABLE.     07/20/2017 Imaging    CT Chest W Contrast IMPRESSION: No evidence of metastatic disease or other acute findings within the thorax.    08/05/2017 - 12/09/2017 Chemotherapy    Xeloda 2528m in am and 20033mpm, 2 weeks on and 1 week off, starting on 08/05/17 and plan for 6 months   Reduced to 2000 mg (4 tablets) in AM, 1500 mg (3 tablets) in PM with cycle 3, cycle 3 has been held since 08/1017 due to GI toxicity and active infection.     12/27/2017 Imaging    CT AP W Contrast 12/27/17 IMPRESSION: 1. Interval partial colectomy with transverse colostomy and long Hartmann's pouch. 2. No evidence of recurrent or metastatic disease. 3. Too small to characterize left hepatic lobe lesion, similar to on the prior exam. Ongoing stability favors a benign lesion such as a tiny cyst. This could be re-evaluated at follow-up. 4. Left renal lesion which measures greater than fluid density but is decreased in size compared to the prior. This favors a complex cyst but is technically indeterminate. Recommend attention on follow-up. 5.  Aortic Atherosclerosis (ICD10-I70.0). 6. Right nephrolithiasis.    02/09/2018 Surgery    Surgery 02/09/2018: LAPAROSCOPIC COLOSTOMY REVERSAL ERAS PATHWAY with ThLeighton RuffMD  07/13/2018 Imaging    IMPRESSION: 1. Interval reversal of the patient's colostomy with reanastomosis. No complicating features. 2. No findings for residual or recurrent tumor, locoregional lymphadenopathy or metastatic disease elsewhere. 3. Stable 5 mm left hepatic lobe lesion, likely benign  cysts. 4. Stable 22 mm lesion projecting off the anterior aspect of the left kidney. This is likely a complex cyst but recommend a precontrast sequence of the abdomen when the patient has is next follow-up CT scan. 5. Stable enlarged prostate gland.      CURRENT THERAPY:  Surveillance  INTERVAL HISTORY:  James Doyle is here for a follow up of colon cancer. He was able to identify himself by face to face video. He notes he is doing well at home. He notes being constipated and other times he will have diarrhea. He notes he has diarrhea more. He denies any new pain. He notes neuropathy at night when his hands goes to sleep. He notes he is able to play instruments at home. He notes his weight is stable overall. He notes he has not seen his other physicians this year.     REVIEW OF SYSTEMS:   Constitutional: Denies fevers, chills or abnormal weight loss Eyes: Denies blurriness of vision Ears, nose, mouth, throat, and face: Denies mucositis or sore throat Respiratory: Denies cough, dyspnea or wheezes Cardiovascular: Denies palpitation, chest discomfort or lower extremity swelling Gastrointestinal:  Denies nausea, heartburn (+) diarrhea versus constipation Skin: Denies abnormal skin rashes Lymphatics: Denies new lymphadenopathy or easy bruising Neurological:Denies new weaknesses (+) mild neuropathy  Behavioral/Psych: Mood is stable, no new changes  All other systems were reviewed with the patient and are negative.  MEDICAL HISTORY:  Past Medical History:  Diagnosis Date  . Aortic atherosclerosis ([place]) 12/27/2017   Noted on CT abd/pelvis  . Arthritis   . Cancer of left colon ([place] Doyle) 06/08/2017  . Cellulitis of left hand   . Chronic back pain   . Chronic pain of right knee   . Chronic right hip pain   . 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 DDD (degenerative disc disease), lumbar   . H/O vocal cord paralysis    left   . History of hypokalemia   .  History of thumb fracture    Left  . Left nephrolithiasis 12/27/2017   Noted on CT abd/pelvis    SURGICAL HISTORY: Past Surgical History:  Procedure Laterality Date  . BACK SURGERY     times 2  . COLON RESECTION    . COLONOSCOPY    . COLOSTOMY    . COLOSTOMY TAKEDOWN N/A 02/09/2018   Procedure: LAPAROSCOPIC COLOSTOMY REVERSAL ERAS PATHWAY;  Surgeon: Leighton Ruff, MD;  Location: [facility];  Service: General;  Laterality: N/A;  . INNER EAR SURGERY     bilat  . LAPAROSCOPY N/A 06/09/2017   Procedure: LAPAROSCOPY DIAGNOSTIC, OPEN  LEFT COLECTOMY;  Surgeon: Leighton Ruff, MD;  Location: [facility];  Service: General;  Laterality: N/A;  . PROSTATE BIOPSY    . TONSILLECTOMY    . TOTAL KNEE ARTHROPLASTY Right 07/27/2016   Procedure: RIGHT TOTAL KNEE ARTHROPLASTY;  Surgeon: Gaynelle Arabian, MD;  Location: [facility];  Service: Orthopedics;  Laterality: Right;    I have reviewed the social history and family history with the patient and they are unchanged from previous note.  ALLERGIES:  has No Known Allergies.  MEDICATIONS:  Current Outpatient Medications  Medication Sig Dispense Refill  . diphenoxylate-atropine (LOMOTIL) 2.5-0.025 MG tablet TAKE 1 TABLET BY  MOUTH 4 TIMES DAILY AS NEEDED FOR DIARRHEA 90 tablet 0   No current facility-administered medications for this visit.     PHYSICAL EXAMINATION: ECOG PERFORMANCE STATUS: 1 - Symptomatic but completely ambulatory  No vitals taken today, Exam not performed today  LABORATORY DATA:  I have reviewed the data as listed CBC Latest Ref Rng & Units 11/10/2018 07/13/2018 04/14/2018  WBC 4.0 - 10.5 K/uL 7.6 7.9 7.6  Hemoglobin 13.0 - 17.0 g/dL 15.5 14.7 14.3  Hematocrit 39.0 - 52.0 % 47.2 44.4 43.2  Platelets 150 - 400 K/uL 172 155 136(L)     CMP Latest Ref Rng & Units 11/10/2018 07/13/2018 04/14/2018  Glucose 70 - 99 mg/dL 115(H) 88 103  BUN 8 - 23 mg/dL 15 14 17   Creatinine 0.61 - 1.24 mg/dL 1.26(H) 1.12 1.05  Sodium 135 - 145 mmol/L 143 142  140  Potassium 3.5 - 5.1 mmol/L 4.0 4.0 3.7  Chloride 98 - 111 mmol/L 104 105 105  CO2 22 - 32 mmol/L 31 26 28   Calcium 8.9 - 10.3 mg/dL 9.5 9.3 9.6  Total Protein 6.5 - 8.1 g/dL 7.6 7.4 7.1  Total Bilirubin 0.3 - 1.2 mg/dL 0.8 0.8 0.5  Alkaline Phos 38 - 126 U/L 87 90 87  AST 15 - 41 U/L 23 25 21   ALT 0 - 44 U/L 22 27 19       RADIOGRAPHIC STUDIES: I have personally reviewed the radiological images as listed and agreed with the findings in the report. No results found.   ASSESSMENT & PLAN:  JADIAN KARMAN is a 73 y.o. male with   1. Left colon cancer, invasive adenocarcinoma, G1, pT3N1bM0, stage IIIB, MSI-stable  -Diagnosed in 05/2017. Treated with hemicolectomy and adjuvant chemo. Currently on surveillance. -per pt he had colonoscopy in 2019, by Dr. Penelope Coop. -He is clinically doing well. No clinical concern for recurrence. -f/u in 3 months with repeat scan in 07/2019 -I encouraged him to continue follow up with his other physicians.   2.Diarrhea and fatigue  -He currently goes between diarrhea and constipation, mainly diarrhea. Likely secondary to surgery  -he use lomotil if needed. I suggest him to try imodium   3. Osteoarthritis -f/u with PCP  Plan  -f/u in 3 months -Labs and CT CAP 3 days before he sees me.    No problem-specific Assessment & Plan notes found for this encounter.   Orders Placed This Encounter  Procedures  . CT Abdomen Pelvis W Contrast    Hold IV contrast if EGFR<50    Standing Status:   Future    Standing Expiration Date:   04/19/2020    Order Specific Question:   If indicated for the ordered procedure, I authorize the administration of contrast media per Radiology protocol    Answer:   Yes    Order Specific Question:   Preferred imaging location?    Answer:   [facility]    Order Specific Question:   Is Oral Contrast requested for this exam?    Answer:   Yes, Per Radiology protocol    Order Specific Question:   Radiology Contrast  Protocol - do NOT remove file path    Answer:   \\[email or web address]  . CT Chest W Contrast    Standing Status:   Future    Standing Expiration Date:   04/19/2020    Order Specific Question:   If indicated for the ordered procedure, I authorize the administration of contrast media per Radiology protocol  Answer:   Yes    Order Specific Question:   Preferred imaging location?    Answer:   [facility]    Order Specific Question:   Radiology Contrast Protocol - do NOT remove file path    Answer:   \\[email or web address]   I discussed the assessment and treatment plan with the patient. The patient was provided an opportunity to ask questions and all were answered. The patient agreed with the plan and demonstrated an understanding of the instructions.  The patient was advised to call back or seek an in-person evaluation if the symptoms worsen or if the condition fails to improve as anticipated.  I provided 15 minutes of face-to-face video visit time during this encounter, and > 50% was spent counseling as documented under my assessment & plan.    Truitt Merle, MD 04/20/2019   I, Joslyn Devon, am acting as scribe for Truitt Merle, MD.   I have reviewed the above documentation for accuracy and completeness, and I agree with the above.

## 2019-04-19 NOTE — Telephone Encounter (Signed)
I attempted to contact patient to verify appt. for pre reg but was not able to reach patient.

## 2019-04-19 NOTE — Telephone Encounter (Signed)
Called patient regarding upcoming Webex appointment, patient would prefer this to be a doximity call.

## 2019-04-20 ENCOUNTER — Other Ambulatory Visit: Payer: Medicare Other

## 2019-04-20 ENCOUNTER — Inpatient Hospital Stay: Payer: Medicare Other | Attending: Hematology | Admitting: Hematology

## 2019-04-20 ENCOUNTER — Encounter: Payer: Self-pay | Admitting: Hematology

## 2019-04-20 DIAGNOSIS — C186 Malignant neoplasm of descending colon: Secondary | ICD-10-CM

## 2019-04-25 ENCOUNTER — Telehealth: Payer: Self-pay | Admitting: Hematology

## 2019-04-25 NOTE — Telephone Encounter (Signed)
No los per 5/21. °

## 2019-06-27 ENCOUNTER — Other Ambulatory Visit: Payer: Self-pay | Admitting: Hematology

## 2019-06-27 DIAGNOSIS — R197 Diarrhea, unspecified: Secondary | ICD-10-CM

## 2019-06-27 MED ORDER — DIPHENOXYLATE-ATROPINE 2.5-0.025 MG PO TABS: ORAL_TABLET | ORAL | 0 refills | Status: DC

## 2019-06-29 ENCOUNTER — Other Ambulatory Visit: Payer: Self-pay | Admitting: Nurse Practitioner

## 2019-07-03 NOTE — Telephone Encounter (Signed)
Please check if this is refilled, thanks   Truitt Merle MD

## 2019-07-10 ENCOUNTER — Telehealth: Payer: Self-pay | Admitting: Hematology

## 2019-07-10 NOTE — Telephone Encounter (Signed)
Scheduled appt per 8/07 sch message- pt is aware of appt date and time

## 2019-07-17 ENCOUNTER — Inpatient Hospital Stay: Payer: Medicare Other | Attending: Hematology

## 2019-07-17 ENCOUNTER — Other Ambulatory Visit: Payer: Self-pay

## 2019-07-17 ENCOUNTER — Ambulatory Visit (HOSPITAL_COMMUNITY)
Admission: RE | Admit: 2019-07-17 | Discharge: 2019-07-17 | Disposition: A | Discharge status: Home / Self Care | Payer: Medicare Other | Source: Ambulatory Visit | Attending: Hematology | Admitting: Hematology

## 2019-07-17 DIAGNOSIS — R030 Elevated blood-pressure reading, without diagnosis of hypertension: Secondary | ICD-10-CM | POA: Diagnosis not present

## 2019-07-17 DIAGNOSIS — C186 Malignant neoplasm of descending colon: Secondary | ICD-10-CM | POA: Diagnosis present

## 2019-07-17 DIAGNOSIS — R7989 Other specified abnormal findings of blood chemistry: Secondary | ICD-10-CM | POA: Diagnosis not present

## 2019-07-17 DIAGNOSIS — R197 Diarrhea, unspecified: Secondary | ICD-10-CM | POA: Insufficient documentation

## 2019-07-17 DIAGNOSIS — K5900 Constipation, unspecified: Secondary | ICD-10-CM | POA: Insufficient documentation

## 2019-07-17 DIAGNOSIS — K769 Liver disease, unspecified: Secondary | ICD-10-CM | POA: Insufficient documentation

## 2019-07-17 LAB — COMPREHENSIVE METABOLIC PANEL
ALT: 19 U/L (ref 0–44)
AST: 20 U/L (ref 15–41)
Albumin: 4.1 g/dL (ref 3.5–5.0)
Alkaline Phosphatase: 89 U/L (ref 38–126)
Anion gap: 10 (ref 5–15)
BUN: 18 mg/dL (ref 8–23)
CO2: 26 mmol/L (ref 22–32)
Calcium: 9.3 mg/dL (ref 8.9–10.3)
Chloride: 104 mmol/L (ref 98–111)
Creatinine, Ser: 1.31 mg/dL — ABNORMAL HIGH (ref 0.61–1.24)
GFR calc Af Amer: >60 mL/min (ref >60)
GFR calc non Af Amer: 53 mL/min — ABNORMAL LOW (ref >60)
Glucose, Bld: 89 mg/dL (ref 70–99)
Potassium: 4.1 mmol/L (ref 3.5–5.1)
Sodium: 140 mmol/L (ref 135–145)
Total Bilirubin: 0.5 mg/dL (ref 0.3–1.2)
Total Protein: 7.2 g/dL (ref 6.5–8.1)

## 2019-07-17 LAB — CBC WITH DIFFERENTIAL/PLATELET
Abs Immature Granulocytes: 0.04 10*3/uL (ref 0.00–0.07)
Basophils Absolute: 0 10*3/uL (ref 0.0–0.1)
Basophils Relative: 0 %
Eosinophils Absolute: 0.1 10*3/uL (ref 0.0–0.5)
Eosinophils Relative: 1 %
HCT: 45.4 % (ref 39.0–52.0)
Hemoglobin: 15.1 g/dL (ref 13.0–17.0)
Immature Granulocytes: 0 %
Lymphocytes Relative: 20 %
Lymphs Abs: 1.9 10*3/uL (ref 0.7–4.0)
MCH: 29.2 pg (ref 26.0–34.0)
MCHC: 33.3 g/dL (ref 30.0–36.0)
MCV: 87.6 fL (ref 80.0–100.0)
Monocytes Absolute: 0.7 10*3/uL (ref 0.1–1.0)
Monocytes Relative: 7 %
Neutro Abs: 6.8 10*3/uL (ref 1.7–7.7)
Neutrophils Relative %: 72 %
Platelets: 166 10*3/uL (ref 150–400)
RBC: 5.18 MIL/uL (ref 4.22–5.81)
RDW: 12.4 % (ref 11.5–15.5)
WBC: 9.6 10*3/uL (ref 4.0–10.5)
nRBC: 0 % (ref 0.0–0.2)

## 2019-07-17 MED ORDER — IOHEXOL 300 MG/ML  SOLN
100.0000 mL | Freq: Once | INTRAMUSCULAR | Status: AC | PRN
  Administered 2019-07-17: 100 mL via INTRAVENOUS

## 2019-07-17 MED ORDER — SODIUM CHLORIDE (PF) 0.9 % IJ SOLN
INTRAMUSCULAR | Status: AC
  Filled 2019-07-17: qty 50

## 2019-07-17 NOTE — Progress Notes (Signed)
[place]   [phone] [phone]   Clinic Follow up Note   Patient Care Team: James Doyle, L.James Sa, MD as PCP - General (Family Medicine) James Ruff, MD as Consulting Physician (General Surgery) James Merle, MD as Consulting Physician (Hematology) James Horner, MD as Consulting Physician (Gastroenterology)  Date of Service:  07/19/2019  CHIEF COMPLAINT: F/u of colon cancer   SUMMARY OF ONCOLOGIC HISTORY: Oncology History Overview Note  Cancer Staging Cancer of left colon [facility]) Staging form: Colon and Rectum, AJCC 8th Edition - Pathologic stage from 06/09/2017: Stage IIIB (pT3, pN1b, cM0) - Signed by James Merle, MD on 07/06/2017     Cancer of left colon ([place])  06/08/2017 Initial Diagnosis   Cancer of left colon ([place])   06/08/2017 Imaging   CT AP W Contrast 06/08/17 IMPRESSION: 1. Mass of the transverse colon near splenic flexure measuring up to 4 cm compatible with colonic adenocarcinoma. No lymphadenopathy by imaging criteria. 2. Proximal dilatation of ascending and transverse colon with mild inflammatory changes likely due to obstruction. 3. 4 mm right middle lobe pulmonary nodule, attention on follow-up recommended. 4. 2 mm right kidney nonobstructing nephrolithiasis.   06/09/2017 Surgery   LAPAROSCOPY DIAGNOSTIC, OPEN LEFT COLECTOMY by Dr. Marcello Doyle   06/09/2017 Pathology Results   Diagnosis 06/09/17 Colon, segmental resection for tumor, left - INVASIVE ADENOCARCINOMA, WELL DIFFERENTIATED, SPANNING 5 CM. - TUMOR INVADES THROUGH MUSCULARIS PROPRIA INTO SUBSEROSAL TISSUE. - RESECTION MARGINS ARE NEGATIVE. - METASTATIC CARCINOMA IN TWO OF TWENTY-SEVEN LYMPH NODES (2/27). - SEE ONCOLOGY TABLE.    07/20/2017 Imaging   CT Chest W Contrast IMPRESSION: No evidence of metastatic disease or other acute findings within the thorax.   08/05/2017 - 12/09/2017 Chemotherapy   Xeloda 2564m in am and 20040mpm, 2 weeks on and 1 week off, starting  on 08/05/17 and plan for 6 months   Reduced to 2000 mg (4 tablets) in AM, 1500 mg (3 tablets) in PM with cycle 3, cycle 3 has been held since 08/1017 due to GI toxicity and active infection.    12/27/2017 Imaging   CT AP W Contrast 12/27/17 IMPRESSION: 1. Interval partial colectomy with transverse colostomy and long Hartmann's pouch. 2. No evidence of recurrent or metastatic disease. 3. Too small to characterize left hepatic lobe lesion, similar to on the prior exam. Ongoing stability favors a benign lesion such as a tiny cyst. This could be re-evaluated at follow-up. 4. Left renal lesion which measures greater than fluid density but is decreased in size compared to the prior. This favors a complex cyst but is technically indeterminate. Recommend attention on follow-up. 5.  Aortic Atherosclerosis (ICD10-I70.0). 6. Right nephrolithiasis.   02/09/2018 Surgery   Surgery 02/09/2018: LAPAROSCOPIC COLOSTOMY REVERSAL ERAS PATHWAY with ThLeighton RuffMD   07/07/88/1694maging   IMPRESSION: 1. Interval reversal of the patient's colostomy with reanastomosis. No complicating features. 2. No findings for residual or recurrent tumor, locoregional lymphadenopathy or metastatic disease elsewhere. 3. Stable 5 mm left hepatic lobe lesion, likely benign cysts. 4. Stable 22 mm lesion projecting off the anterior aspect of the left kidney. This is likely a complex cyst but recommend a precontrast sequence of the abdomen when the patient has is next follow-up CT scan. 5. Stable enlarged prostate gland.      CURRENT THERAPY:  Surveillance  INTERVAL HISTORY:  James Doyle here for a follow up colon cancer. She presents to the clinic alone. He notes he is doing well. He notes he had  a big salad 2 weeks ago and had a bout a diarrhea. This has resolved. He notes his bowels are not completley regular as he will have diarrhea periodically. He notes 1 episode of constipation where he thought he should go  to ED. He notes having a moderate amount of gas. He note she does not monitor her BP at home but has BP meter.  He notes waking up with numbness of hands and this resolves when he moves his muscles. He is able to play guitar well still. He notes years ago he saw urologist and had prostate biopsy, results benign.     REVIEW OF SYSTEMS:   Constitutional: Denies fevers, chills or abnormal weight loss Eyes: Denies blurriness of vision Ears, nose, mouth, throat, and face: Denies mucositis or sore throat Respiratory: Denies cough, dyspnea or wheezes Cardiovascular: Denies palpitation, chest discomfort or lower extremity swelling Gastrointestinal:  Denies nausea, heartburn (+) diarrhea (+) gas Skin: Denies abnormal skin rashes Lymphatics: Denies new lymphadenopathy or easy bruising Neurological: (+) Morning numbness of hands  Behavioral/Psych: Mood is stable, no new changes  All other systems were reviewed with the patient and are negative.  MEDICAL HISTORY:  Past Medical History:  Diagnosis Date  . Aortic atherosclerosis ([place]) 12/27/2017   Noted on CT abd/pelvis  . Arthritis   . Cancer of left colon ([place]) 06/08/2017  . Cellulitis of left hand   . Chronic back pain   . Chronic pain of right knee   . Chronic right hip pain   . 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 DDD (degenerative disc disease), lumbar   . H/O vocal cord paralysis    left   . History of hypokalemia   . History of thumb fracture    Left  . Left nephrolithiasis 12/27/2017   Noted on CT abd/pelvis    SURGICAL HISTORY: Past Surgical History:  Procedure Laterality Date  . BACK SURGERY     times 2  . COLON RESECTION    . COLONOSCOPY    . COLOSTOMY    . COLOSTOMY TAKEDOWN N/A 02/09/2018   Procedure: LAPAROSCOPIC COLOSTOMY REVERSAL ERAS PATHWAY;  Surgeon: James Ruff, MD;  Location: [facility];  Service: General;  Laterality: N/A;  . INNER EAR SURGERY     bilat  . LAPAROSCOPY N/A 06/09/2017    Procedure: LAPAROSCOPY DIAGNOSTIC, OPEN  LEFT COLECTOMY;  Surgeon: James Ruff, MD;  Location: [facility];  Service: General;  Laterality: N/A;  . PROSTATE BIOPSY    . TONSILLECTOMY    . TOTAL KNEE ARTHROPLASTY Right 07/27/2016   Procedure: RIGHT TOTAL KNEE ARTHROPLASTY;  Surgeon: Gaynelle Arabian, MD;  Location: [facility];  Service: Orthopedics;  Laterality: Right;    I have reviewed the social history and family history with the patient and they are unchanged from previous note.  ALLERGIES:  has No Known Allergies.  MEDICATIONS:  Current Outpatient Medications  Medication Sig Dispense Refill  . diphenoxylate-atropine (LOMOTIL) 2.5-0.025 MG tablet TAKE 1 TABLET BY MOUTH FOUR TIMES DAILY AS NEEDED FOR DIARRHEA 90 tablet 0  . diphenoxylate-atropine (LOMOTIL) 2.5-0.025 MG tablet TAKE 1 TABLET BY MOUTH 4 TIMES DAILY AS NEEDED FOR DIARRHEA 60 tablet 0   No current facility-administered medications for this visit.     PHYSICAL EXAMINATION: ECOG PERFORMANCE STATUS: 0 - Asymptomatic  Vitals:   07/19/19 0920  BP: (!) 167/89  Pulse: (!) 52  Resp: 17  Temp: 98.9 F (37.2 C)  SpO2: 100%   Filed Weights   07/19/19  0920  Weight: 246 lb (111.6 kg)    GENERAL:alert, no distress and comfortable SKIN: skin color, texture, turgor are normal, no rashes or significant lesions EYES: normal, Conjunctiva are pink and non-injected, sclera clear  NECK: supple, thyroid normal size, non-tender, without nodularity LYMPH:  no palpable lymphadenopathy in the cervical, axillary  LUNGS: clear to auscultation and percussion with normal breathing effort HEART: regular rate & rhythm and no murmurs and no lower extremity edema ABDOMEN:abdomen soft, non-tender and normal bowel sounds Musculoskeletal:no cyanosis of digits and no clubbing  NEURO: alert & oriented x 3 with fluent speech, no focal motor/sensory deficits  LABORATORY DATA:  I have reviewed the data as listed CBC Latest Ref Rng & Units 07/17/2019  11/10/2018 07/13/2018  WBC 4.0 - 10.5 K/uL 9.6 7.6 7.9  Hemoglobin 13.0 - 17.0 g/dL 15.1 15.5 14.7  Hematocrit 39.0 - 52.0 % 45.4 47.2 44.4  Platelets 150 - 400 K/uL 166 172 155     CMP Latest Ref Rng & Units 07/17/2019 11/10/2018 07/13/2018  Glucose 70 - 99 mg/dL 89 115(H) 88  BUN 8 - 23 mg/dL 18 15 14   Creatinine 0.61 - 1.24 mg/dL 1.31(H) 1.26(H) 1.12  Sodium 135 - 145 mmol/L 140 143 142  Potassium 3.5 - 5.1 mmol/L 4.1 4.0 4.0  Chloride 98 - 111 mmol/L 104 104 105  CO2 22 - 32 mmol/L 26 31 26   Calcium 8.9 - 10.3 mg/dL 9.3 9.5 9.3  Total Protein 6.5 - 8.1 g/dL 7.2 7.6 7.4  Total Bilirubin 0.3 - 1.2 mg/dL 0.5 0.8 0.8  Alkaline Phos 38 - 126 U/L 89 87 90  AST 15 - 41 U/L 20 23 25   ALT 0 - 44 U/L 19 22 27       RADIOGRAPHIC STUDIES: I have personally reviewed the radiological images as listed and agreed with the findings in the report. Ct Chest W Contrast  Result Date: 07/17/2019 CLINICAL DATA:  Colon cancer surveillance. EXAM: CT CHEST, ABDOMEN, AND PELVIS WITH CONTRAST TECHNIQUE: Multidetector CT imaging of the chest, abdomen and pelvis was performed following the standard protocol during bolus administration of intravenous contrast. CONTRAST:  180m OMNIPAQUE IOHEXOL 300 MG/ML  SOLN COMPARISON:  07/13/2018 FINDINGS: CT CHEST FINDINGS Cardiovascular: The heart size is normal. No substantial pericardial effusion. Atherosclerotic calcification is noted in the wall of the thoracic aorta. Mediastinum/Nodes: No mediastinal lymphadenopathy. There is no hilar lymphadenopathy. The esophagus has normal imaging features. There is no axillary lymphadenopathy. Lungs/Pleura: 4 mm subpleural right middle lobe nodule (87/4) is stable. No new suspicious pulmonary nodule or mass. No focal airspace consolidation. No pulmonary edema or pleural effusion. Musculoskeletal: No worrisome lytic or sclerotic osseous abnormality. CT ABDOMEN PELVIS FINDINGS Hepatobiliary: Tiny 5 mm hypodensity in the dome of the left  liver is stable. Small area of low attenuation in the anterior liver, adjacent to the falciform ligament, is in a characteristic location for focal fatty deposition and unchanged in the interval. There is no evidence for gallstones, gallbladder wall thickening, or pericholecystic fluid. No intrahepatic or extrahepatic biliary dilation. Pancreas: No focal mass lesion. No dilatation of the main duct. No intraparenchymal cyst. No peripancreatic edema. Spleen: No splenomegaly. No focal mass lesion. Adrenals/Urinary Tract: No adrenal nodule or mass. Bilateral renal cysts are again noted, measuring up to approximately 7 cm in the right kidney and 8.7 cm in the left kidney. 2.0 cm exophytic lesion anterior left kidney has attenuation higher than would be expected for a simple cyst but is stable to minimally smaller  in the interval. 2 mm nonobstructing stone identified interpolar right kidney. No stones are seen in the left kidney. No evidence for hydroureter. The urinary bladder appears normal for the degree of distention. Stomach/Bowel: Stomach is unremarkable. No gastric wall thickening. No evidence of outlet obstruction. Duodenum is normally positioned as is the ligament of Treitz. No small bowel wall thickening. No small bowel dilatation. The terminal ileum is normal. The appendix is not visualized, but there is no edema or inflammation in the region of the cecum. Left colonic anastomosis evident. Vascular/Lymphatic: There is abdominal aortic atherosclerosis without aneurysm. There is no gastrohepatic or hepatoduodenal ligament lymphadenopathy. No intraperitoneal or retroperitoneal lymphadenopathy. No pelvic sidewall lymphadenopathy. Reproductive: Prostate gland is enlarged. Other: No intraperitoneal free fluid. Musculoskeletal: Small bilateral groin hernias contain only fat. There is a tiny fat containing umbilical hernia evident. No worrisome lytic or sclerotic osseous abnormality. No worrisome lytic or sclerotic  osseous abnormality. Status post L5-S1 fusion. IMPRESSION: 1. Stable exam. No new or progressive interval findings to suggest recurrent or metastatic disease in the chest, abdomen, or pelvis. 2. No change in the tiny hypodensity in the dome of the left liver compatible with benign etiology. 3. 2.0 cm exophytic lesion in the left kidney measures stable to minimally smaller in the interval. As noted previously, this is probably a cyst complicated by proteinaceous debris or hemorrhage. Continued attention on follow-up recommended. 4. Prostatomegaly. 5.  Aortic Atherosclerois (ICD10-170.0) Electronically Signed   By: Misty Stanley M.D.   On: 07/17/2019 16:13   Ct Abdomen Pelvis W Contrast  Result Date: 07/17/2019 CLINICAL DATA:  Colon cancer surveillance. EXAM: CT CHEST, ABDOMEN, AND PELVIS WITH CONTRAST TECHNIQUE: Multidetector CT imaging of the chest, abdomen and pelvis was performed following the standard protocol during bolus administration of intravenous contrast. CONTRAST:  142m OMNIPAQUE IOHEXOL 300 MG/ML  SOLN COMPARISON:  07/13/2018 FINDINGS: CT CHEST FINDINGS Cardiovascular: The heart size is normal. No substantial pericardial effusion. Atherosclerotic calcification is noted in the wall of the thoracic aorta. Mediastinum/Nodes: No mediastinal lymphadenopathy. There is no hilar lymphadenopathy. The esophagus has normal imaging features. There is no axillary lymphadenopathy. Lungs/Pleura: 4 mm subpleural right middle lobe nodule (87/4) is stable. No new suspicious pulmonary nodule or mass. No focal airspace consolidation. No pulmonary edema or pleural effusion. Musculoskeletal: No worrisome lytic or sclerotic osseous abnormality. CT ABDOMEN PELVIS FINDINGS Hepatobiliary: Tiny 5 mm hypodensity in the dome of the left liver is stable. Small area of low attenuation in the anterior liver, adjacent to the falciform ligament, is in a characteristic location for focal fatty deposition and unchanged in the  interval. There is no evidence for gallstones, gallbladder wall thickening, or pericholecystic fluid. No intrahepatic or extrahepatic biliary dilation. Pancreas: No focal mass lesion. No dilatation of the main duct. No intraparenchymal cyst. No peripancreatic edema. Spleen: No splenomegaly. No focal mass lesion. Adrenals/Urinary Tract: No adrenal nodule or mass. Bilateral renal cysts are again noted, measuring up to approximately 7 cm in the right kidney and 8.7 cm in the left kidney. 2.0 cm exophytic lesion anterior left kidney has attenuation higher than would be expected for a simple cyst but is stable to minimally smaller in the interval. 2 mm nonobstructing stone identified interpolar right kidney. No stones are seen in the left kidney. No evidence for hydroureter. The urinary bladder appears normal for the degree of distention. Stomach/Bowel: Stomach is unremarkable. No gastric wall thickening. No evidence of outlet obstruction. Duodenum is normally positioned as is the ligament of Treitz. No  small bowel wall thickening. No small bowel dilatation. The terminal ileum is normal. The appendix is not visualized, but there is no edema or inflammation in the region of the cecum. Left colonic anastomosis evident. Vascular/Lymphatic: There is abdominal aortic atherosclerosis without aneurysm. There is no gastrohepatic or hepatoduodenal ligament lymphadenopathy. No intraperitoneal or retroperitoneal lymphadenopathy. No pelvic sidewall lymphadenopathy. Reproductive: Prostate gland is enlarged. Other: No intraperitoneal free fluid. Musculoskeletal: Small bilateral groin hernias contain only fat. There is a tiny fat containing umbilical hernia evident. No worrisome lytic or sclerotic osseous abnormality. No worrisome lytic or sclerotic osseous abnormality. Status post L5-S1 fusion. IMPRESSION: 1. Stable exam. No new or progressive interval findings to suggest recurrent or metastatic disease in the chest, abdomen, or  pelvis. 2. No change in the tiny hypodensity in the dome of the left liver compatible with benign etiology. 3. 2.0 cm exophytic lesion in the left kidney measures stable to minimally smaller in the interval. As noted previously, this is probably a cyst complicated by proteinaceous debris or hemorrhage. Continued attention on follow-up recommended. 4. Prostatomegaly. 5.  Aortic Atherosclerois (ICD10-170.0) Electronically Signed   By: Misty Stanley M.D.   On: 07/17/2019 16:13     ASSESSMENT & PLAN:  James Doyle is a 73 y.o. male with   1. Left colon cancer, invasive adenocarcinoma, G1, pT3N1bM0, stage IIIB, MSI-stable -Diagnosed in 05/2017. Treated withhemicolectomy and adjuvantchemo. Currently on surveillance. -per pt he had colonoscopy in 2019, by Dr. Penelope Coop. -We reviewed and discussed his CT CAP from 07/17/19 which shows no findings for residual or recurrent tumor or metastatic disease. Stable 5 mm left hepatic lobe lesion, likely benign cysts. Stable enlarged prostate and multiple renal cysts  -He is clinically doing well. He has residual hand numbness in the morning, likely form prior chemo. Labs reviewed from last week, CBC and CMP WNL except Cr 1.31.  -F/u in 4 months     2.Occasional diarrhea -He currently goes between diarrhea and constipation, mainly diarrhea. Likely secondary to surgery  -He has persistent gas and diarrhea periodically especially based on what he eats, manageable.    3. Elevated Cr, multiple kidney cysts -Cr at 1.31 today (07/18/19), slightly elevated, normal before. I strongly encouraged him to increase water intake  -On CT CAP he has multiple kidney cysts, likely benign.    4. Elevated BP  -He has had elevated BP in clinic the last 2 visits in 7 months. BP at 167/89 today (07/19/19) -I recommend he monitor his BP at home. If persistently elevated he should see his PCP about medication to control this.    5. Osteoarthritis -f/u with PCP   Plan -CT  CAP reviewed, NED. He is clinically doing well -Lab and f/u in 4 months      No problem-specific Assessment & Plan notes found for this encounter.   No orders of the defined types were placed in this encounter.  All questions were answered. The patient knows to call the clinic with any problems, questions or concerns. No barriers to learning was detected. I spent 20 minutes counseling the patient face to face. The total time spent in the appointment was 25 minutes and more than 50% was on counseling and review of test results     James Merle, MD 07/19/2019   I, Joslyn Devon, am acting as scribe for James Merle, MD.   I have reviewed the above documentation for accuracy and completeness, and I agree with the above.

## 2019-07-19 ENCOUNTER — Inpatient Hospital Stay (HOSPITAL_BASED_OUTPATIENT_CLINIC_OR_DEPARTMENT_OTHER): Payer: Medicare Other | Admitting: Hematology

## 2019-07-19 ENCOUNTER — Telehealth: Payer: Self-pay | Admitting: Hematology

## 2019-07-19 ENCOUNTER — Other Ambulatory Visit: Payer: Medicare Other

## 2019-07-19 ENCOUNTER — Encounter: Payer: Self-pay | Admitting: Hematology

## 2019-07-19 ENCOUNTER — Other Ambulatory Visit: Payer: Self-pay

## 2019-07-19 VITALS — BP 167/89 | HR 52 | Temp 98.9°F | Resp 17 | Ht 73.0 in | Wt 246.0 lb

## 2019-07-19 DIAGNOSIS — C186 Malignant neoplasm of descending colon: Secondary | ICD-10-CM

## 2019-07-19 NOTE — Telephone Encounter (Signed)
Scheduled appt per 8/19 los.  Spoke with patient and they are aware of the appt date and time.

## 2019-08-19 IMAGING — CT CT CHEST W/O CM
2 of 4 series · 15 of 36 positions shown, 18 images · non-contrast
Comparison: None.

CLINICAL DATA: Recently diagnosed left colon carcinoma. Staging
workup.

EXAM:
CT CHEST WITHOUT CONTRAST
TECHNIQUE: Multidetector CT imaging of the chest was performed following the
standard protocol without IV contrast.

[Series 2: thorax · axial · 0.92mm/px · z∈[-205,+33]mm · 12 of 141 slices shown, 15 images]
[im 11/141  mediastinal]
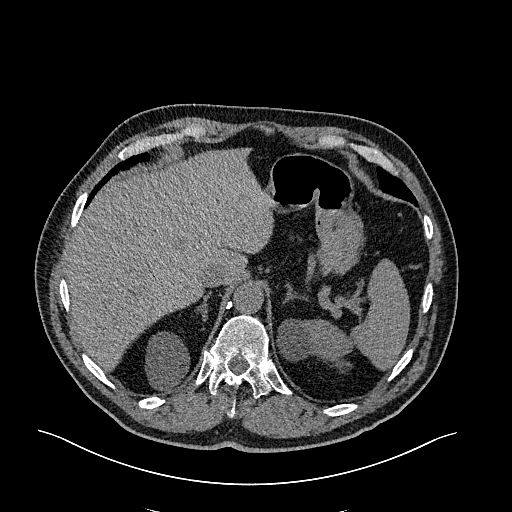
[im 11/141  lung]
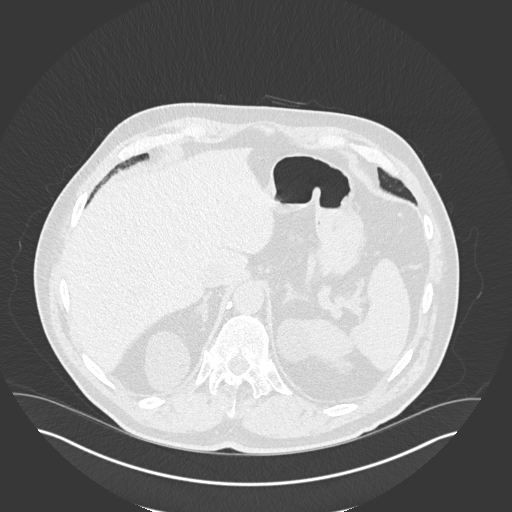
[im 22/141  lung]
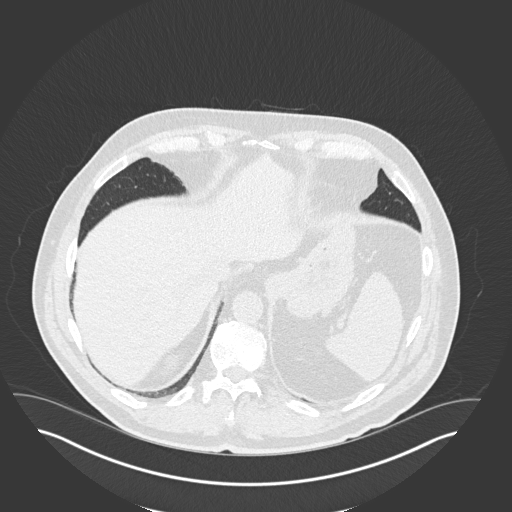
[im 33/141  lung]
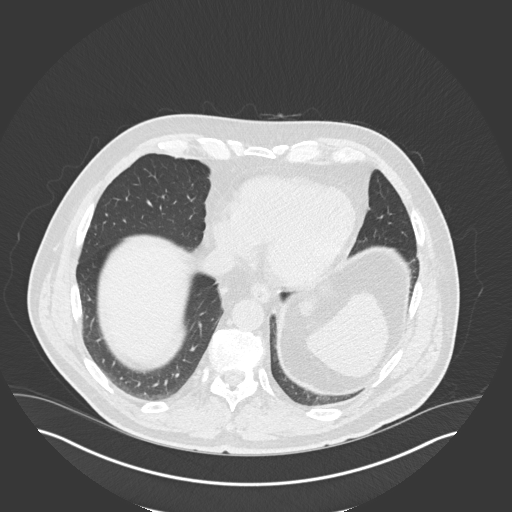
[im 44/141  lung]
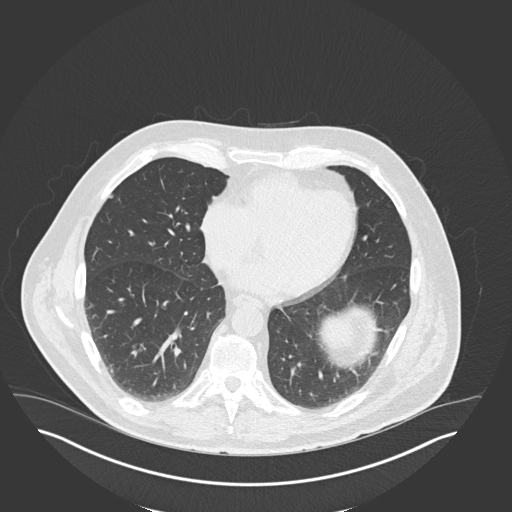
[im 54/141  mediastinal]
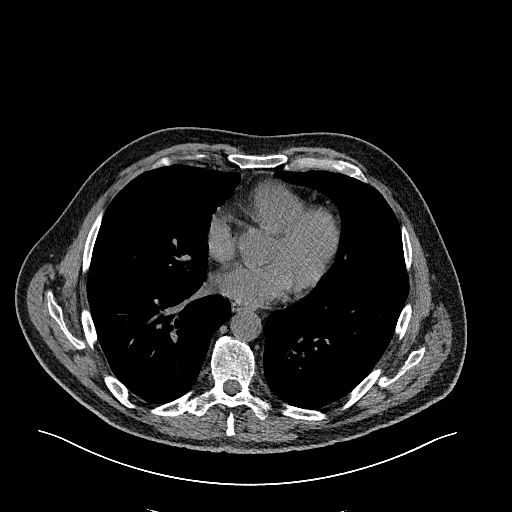
[im 54/141  lung]
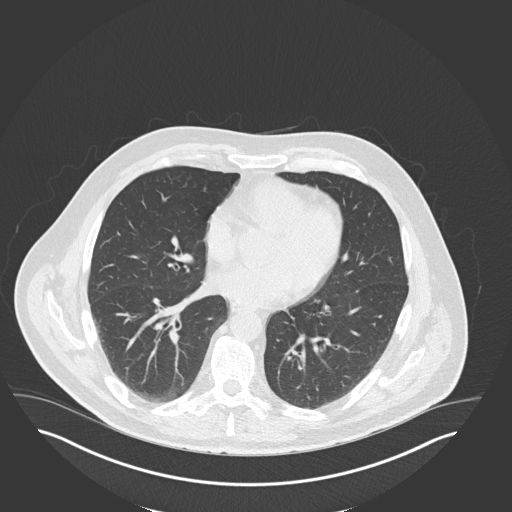
[im 65/141  lung]
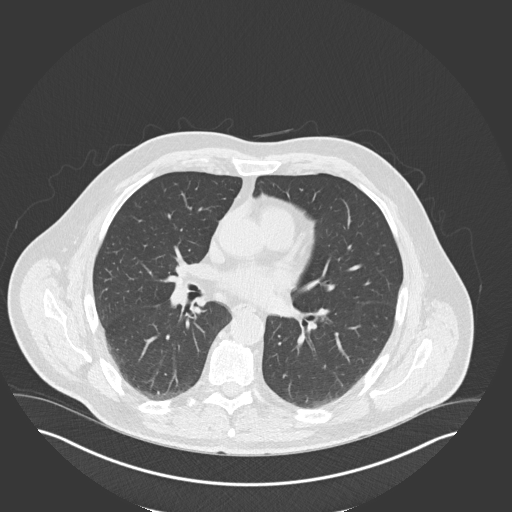
[im 76/141  lung]
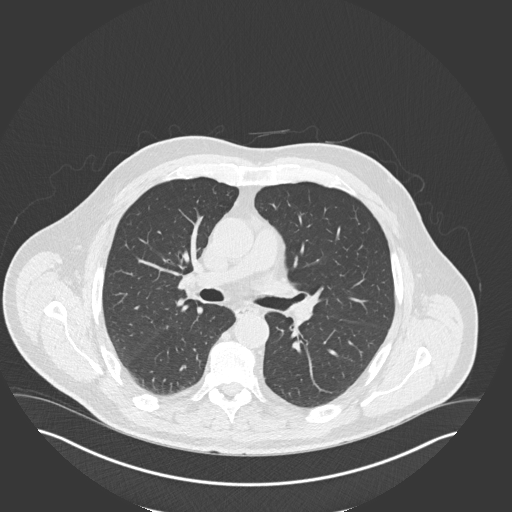
[im 87/141  lung]
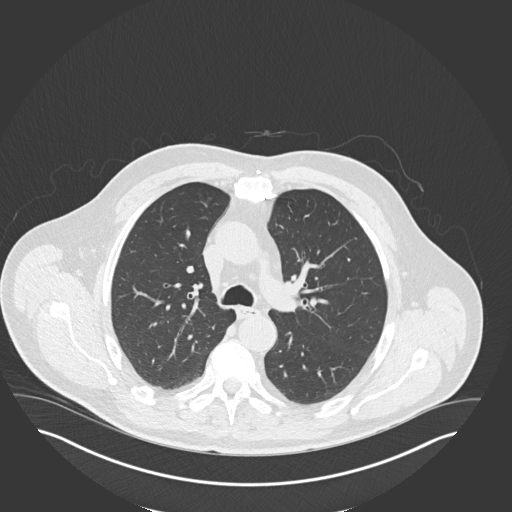
[im 97/141  mediastinal]
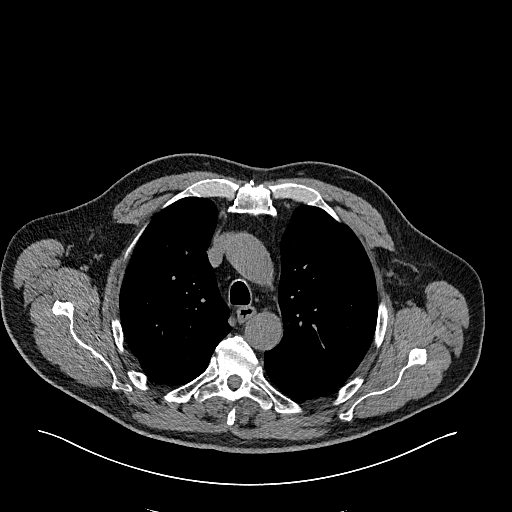
[im 97/141  lung]
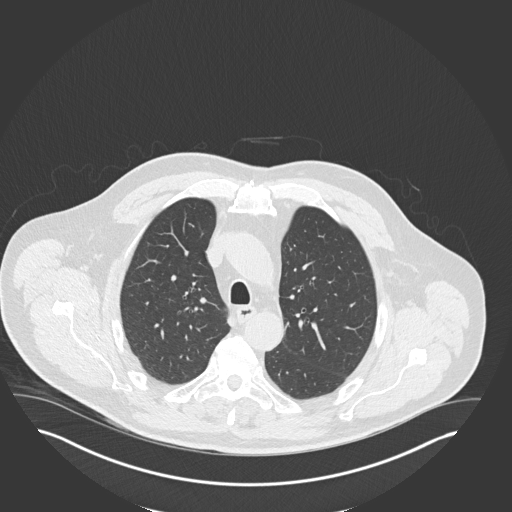
[im 108/141  lung]
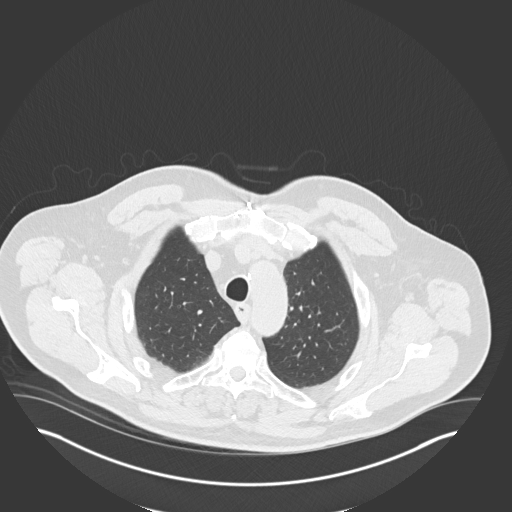
[im 119/141  lung]
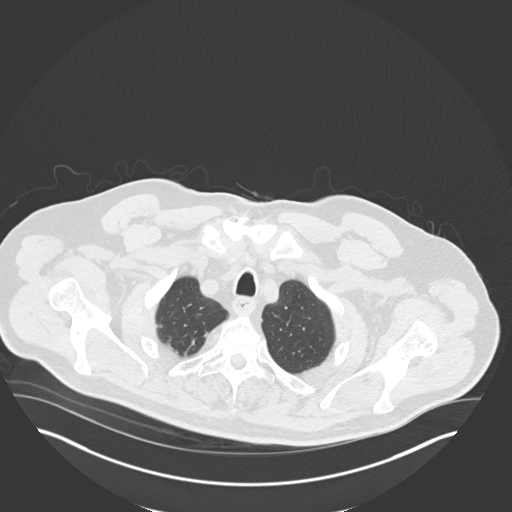
[im 130/141  lung]
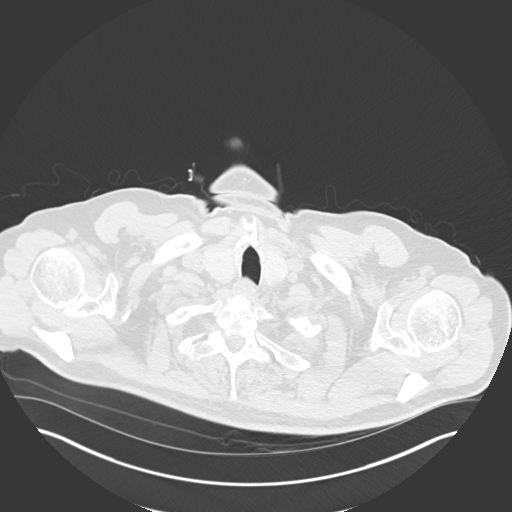

[Series 5: coronal · coronal · 0.59mm/px · 3 of 145 slices shown]
[im 29/145  lung]
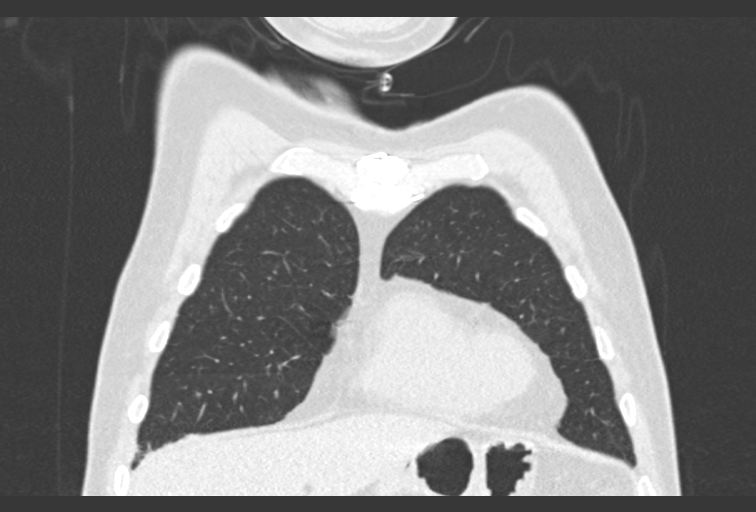
[im 58/145  lung]
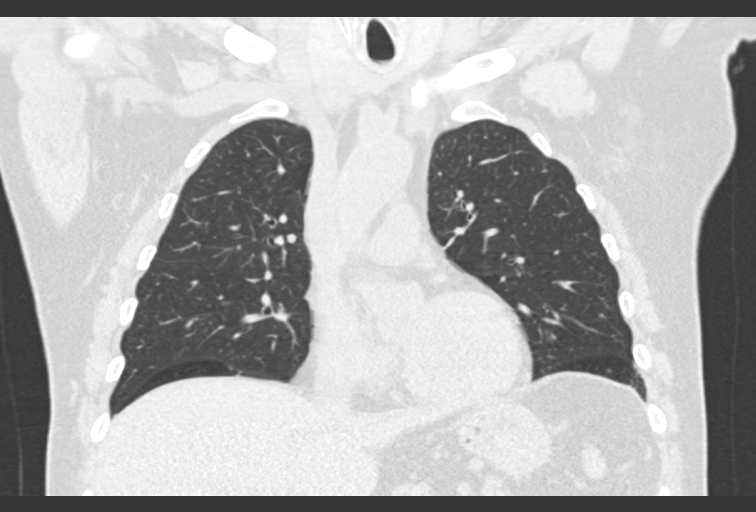
[im 87/145  lung]
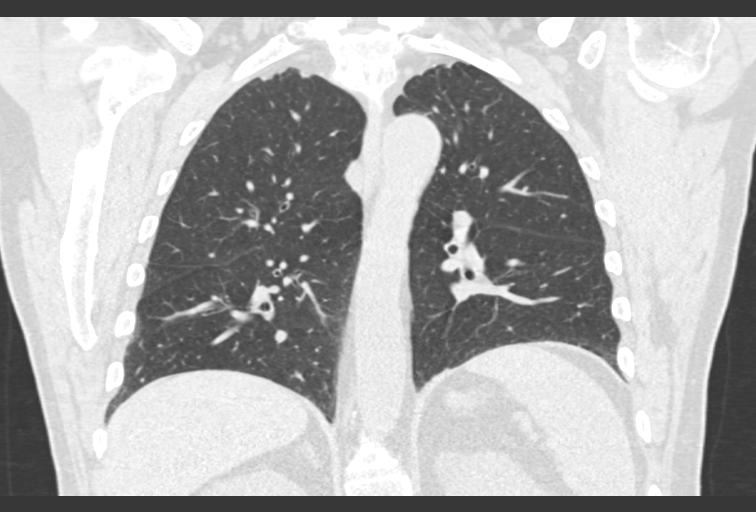

[15 of 36 positions shown; findings below may reference images not displayed]

FINDINGS: Cardiovascular: No acute findings. Aortic and coronary artery
atherosclerosis. Prior median sternotomy.

Mediastinum/Nodes: No masses or pathologically enlarged lymph nodes
identified on this unenhanced exam.

Lungs/Pleura: No pulmonary infiltrate or mass identified. No
suspicious pulmonary nodules identified. No effusion present.

Upper Abdomen: Fluid attenuation cysts noted in visualized portions
of kidneys.

Musculoskeletal:  No suspicious bone lesions.
IMPRESSION: No evidence of metastatic disease or other acute findings within the
thorax.

Aortic Atherosclerosis ([id number]).

## 2019-11-09 ENCOUNTER — Telehealth: Payer: Self-pay | Admitting: Hematology

## 2019-11-09 NOTE — Telephone Encounter (Signed)
Called patient per providers request, patient agreed to do virtual visit. Labs cancelled.

## 2019-11-16 NOTE — Progress Notes (Signed)
James Doyle   [phone] Fax:([phone]) [phone]   Clinic Follow up Note   Patient Care Team: Alroy Dust, L.Marlou Sa, MD as PCP - General (Family Medicine) Leighton Ruff, MD as Consulting Physician (General Surgery) Truitt Merle, MD as Consulting Physician (Hematology) Wonda Horner, MD as Consulting Physician (Gastroenterology)   I connected with James Doyle on 11/17/2019 at  9:40 AM EST by telephone visit and verified that I am speaking with the correct person using two identifiers.  I discussed the limitations, risks, security and privacy concerns of performing an evaluation and management service by telephone and the availability of in person appointments. I also discussed with the patient that there may be a patient responsible charge related to this service. The patient expressed understanding and agreed to proceed.   Other persons participating in the visit and their role in the encounter:  His wife  Patient's location:  His home  Provider's location:  My Office  CHIEF COMPLAINT:  F/u of colon cancer  SUMMARY OF ONCOLOGIC HISTORY: Oncology History Overview Note  Cancer Staging Cancer of left colon [facility]) Staging form: Colon and Rectum, AJCC 8th Edition - Pathologic stage from 06/09/2017: Stage IIIB (pT3, pN1b, cM0) - Signed by Truitt Merle, MD on 07/06/2017     Cancer of left colon ([place])  06/08/2017 Initial Diagnosis   Cancer of left colon ([place])   06/08/2017 Imaging   CT AP W Contrast 06/08/17 IMPRESSION: 1. Mass of the transverse colon near splenic flexure measuring up to 4 cm compatible with colonic adenocarcinoma. No lymphadenopathy by imaging criteria. 2. Proximal dilatation of ascending and transverse colon with mild inflammatory changes likely due to obstruction. 3. 4 mm right middle lobe pulmonary nodule, attention on follow-up recommended. 4. 2 mm right kidney nonobstructing nephrolithiasis.   06/09/2017 Surgery   LAPAROSCOPY DIAGNOSTIC, OPEN  LEFT COLECTOMY by Dr. Marcello Moores   06/09/2017 Pathology Results   Diagnosis 06/09/17 Colon, segmental resection for tumor, left - INVASIVE ADENOCARCINOMA, WELL DIFFERENTIATED, SPANNING 5 CM. - TUMOR INVADES THROUGH MUSCULARIS PROPRIA INTO SUBSEROSAL TISSUE. - RESECTION MARGINS ARE NEGATIVE. - METASTATIC CARCINOMA IN TWO OF TWENTY-SEVEN LYMPH NODES (2/27). - SEE ONCOLOGY TABLE.    07/20/2017 Imaging   CT Chest W Contrast IMPRESSION: No evidence of metastatic disease or other acute findings within the thorax.   08/05/2017 - 12/09/2017 Chemotherapy   Xeloda 2574m in am and 20046mpm, 2 weeks on and 1 week off, starting on 08/05/17 and plan for 6 months   Reduced to 2000 mg (4 tablets) in AM, 1500 mg (3 tablets) in PM with cycle 3, cycle 3 has been held since 08/1017 due to GI toxicity and active infection.    12/27/2017 Imaging   CT AP W Contrast 12/27/17 IMPRESSION: 1. Interval partial colectomy with transverse colostomy and long Hartmann's pouch. 2. No evidence of recurrent or metastatic disease. 3. Too small to characterize left hepatic lobe lesion, similar to on the prior exam. Ongoing stability favors a benign lesion such as a tiny cyst. This could be re-evaluated at follow-up. 4. Left renal lesion which measures greater than fluid density but is decreased in size compared to the prior. This favors a complex cyst but is technically indeterminate. Recommend attention on follow-up. 5.  Aortic Atherosclerosis (ICD10-I70.0). 6. Right nephrolithiasis.   02/09/2018 Surgery   Surgery 02/09/2018: LAPAROSCOPIC COLOSTOMY REVERSAL ERAS PATHWAY with ThLeighton RuffMD   07/05/82/4196maging   IMPRESSION: 1. Interval reversal of the patient's colostomy with reanastomosis. No complicating features. 2.  No findings for residual or recurrent tumor, locoregional lymphadenopathy or metastatic disease elsewhere. 3. Stable 5 mm left hepatic lobe lesion, likely benign cysts. 4. Stable 22 mm lesion  projecting off the anterior aspect of the left kidney. This is likely a complex cyst but recommend a precontrast sequence of the abdomen when the patient has is next follow-up CT scan. 5. Stable enlarged prostate gland.      CURRENT THERAPY:  Surveillance  INTERVAL HISTORY:  James Doyle is here for a follow up of colon cancer.  He notes his PCP started him on lisinopril and he feels that afterward when he noticed his stomach pain with bloating. He notes he stopped lisinopril and the stomach pain stopped. When he restarted the pain returned. Having a BM helped improve his stomach pain and bloating. He notes this pain is around has navel and that he could be constipated. He denies blood or black stool or fever.     REVIEW OF SYSTEMS:   Constitutional: Denies fevers, chills or abnormal weight loss Eyes: Denies blurriness of vision Ears, nose, mouth, throat, and face: Denies mucositis or sore throat Respiratory: Denies cough, dyspnea or wheezes Cardiovascular: Denies palpitation, chest discomfort or lower extremity swelling Gastrointestinal:  Denies nausea, heartburn (+) Mild constipation (+) abdominal pain and bloating  Skin: Denies abnormal skin rashes Lymphatics: Denies new lymphadenopathy or easy bruising Neurological:Denies numbness, tingling or new weaknesses Behavioral/Psych: Mood is stable, no new changes  All other systems were reviewed with the patient and are negative.  MEDICAL HISTORY:  Past Medical History:  Diagnosis Date  . Aortic atherosclerosis ([place]) 12/27/2017   Noted on CT abd/pelvis  . Arthritis   . Cancer of left colon ([place]) 06/08/2017  . Cellulitis of left hand   . Chronic back pain   . Chronic pain of right knee   . Chronic right hip pain   . 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 DDD (degenerative disc disease), lumbar   . H/O vocal cord paralysis    left   . History of hypokalemia   . History of thumb fracture    Left   . Left nephrolithiasis 12/27/2017   Noted on CT abd/pelvis    SURGICAL HISTORY: Past Surgical History:  Procedure Laterality Date  . BACK SURGERY     times 2  . COLON RESECTION    . COLONOSCOPY    . COLOSTOMY    . COLOSTOMY TAKEDOWN N/A 02/09/2018   Procedure: LAPAROSCOPIC COLOSTOMY REVERSAL ERAS PATHWAY;  Surgeon: Leighton Ruff, MD;  Location: [facility];  Service: General;  Laterality: N/A;  . INNER EAR SURGERY     bilat  . LAPAROSCOPY N/A 06/09/2017   Procedure: LAPAROSCOPY DIAGNOSTIC, OPEN  LEFT COLECTOMY;  Surgeon: Leighton Ruff, MD;  Location: [facility];  Service: General;  Laterality: N/A;  . PROSTATE BIOPSY    . TONSILLECTOMY    . TOTAL KNEE ARTHROPLASTY Right 07/27/2016   Procedure: RIGHT TOTAL KNEE ARTHROPLASTY;  Surgeon: Gaynelle Arabian, MD;  Location: [facility];  Service: Orthopedics;  Laterality: Right;    I have reviewed the social history and family history with the patient and they are unchanged from previous note.  ALLERGIES:  has No Known Allergies.  MEDICATIONS:  Current Outpatient Medications  Medication Sig Dispense Refill  . diphenoxylate-atropine (LOMOTIL) 2.5-0.025 MG tablet TAKE 1 TABLET BY MOUTH FOUR TIMES DAILY AS NEEDED FOR DIARRHEA 90 tablet 0  . diphenoxylate-atropine (LOMOTIL) 2.5-0.025 MG tablet TAKE 1 TABLET BY MOUTH 4 TIMES  DAILY AS NEEDED FOR DIARRHEA 60 tablet 0   No current facility-administered medications for this visit.    PHYSICAL EXAMINATION: ECOG PERFORMANCE STATUS: 3 - Symptomatic, >50% confined to bed  No vitals taken today, Exam not performed today   LABORATORY DATA:  I have reviewed the data as listed CBC Latest Ref Rng & Units 07/17/2019 11/10/2018 07/13/2018  WBC 4.0 - 10.5 K/uL 9.6 7.6 7.9  Hemoglobin 13.0 - 17.0 g/dL 15.1 15.5 14.7  Hematocrit 39.0 - 52.0 % 45.4 47.2 44.4  Platelets 150 - 400 K/uL 166 172 155     CMP Latest Ref Rng & Units 07/17/2019 11/10/2018 07/13/2018  Glucose 70 - 99 mg/dL 89 115(H) 88  BUN 8 - 23 mg/dL  18 15 14   Creatinine 0.61 - 1.24 mg/dL 1.31(H) 1.26(H) 1.12  Sodium 135 - 145 mmol/L 140 143 142  Potassium 3.5 - 5.1 mmol/L 4.1 4.0 4.0  Chloride 98 - 111 mmol/L 104 104 105  CO2 22 - 32 mmol/L 26 31 26   Calcium 8.9 - 10.3 mg/dL 9.3 9.5 9.3  Total Protein 6.5 - 8.1 g/dL 7.2 7.6 7.4  Total Bilirubin 0.3 - 1.2 mg/dL 0.5 0.8 0.8  Alkaline Phos 38 - 126 U/L 89 87 90  AST 15 - 41 U/L 20 23 25   ALT 0 - 44 U/L 19 22 27       RADIOGRAPHIC STUDIES: I have personally reviewed the radiological images as listed and agreed with the findings in the report. No results found.   ASSESSMENT & PLAN:  James Doyle is a 73 y.o. male with   1. Left colon cancer, invasive adenocarcinoma, G1, pT3N1bM0, stage IIIB, MSI-stable -Diagnosed in 05/2017. Treated withhemicolectomy and adjuvantchemo. Currently on surveillance. -per pthe had colonoscopy in 2019, by Dr. Penelope Coop. Last CT in 07/2019 was negative for recurrence  -He notes he is has not been doing as well due to constipation and abdominal pain which started one day ago. He had similar episode which resolved the next day.  I reviewed his overall symptoms and recommended him to start with laxative to improve BM. If he has persistent pain after BM, he will call us back to be seen in clinic or go to ED.   -Phone call next week.   2.Occasional diarrhea, Mid abdominal pain, constipation -He currently goes between diarrhea and constipation, mainly diarrhea.Likely secondary to surgery  -He has persistent gas and diarrhea periodically especially based on what he eats, manageable.  -He has been more constipated lately with mid abdominal pain and bloating. He is not sure if pain is related to constipation or recently given lisinopril. His pain does improve with BM or when holding lisinopril. I recommend he start laxative such as Miralax to help his constipation. If this does not stop or improve pain, I recommend he contact us.   3. Elevated Cr, multiple  kidney cysts -Cr at 1.31 today (07/18/19), slightly elevated, normal before. I strongly encouraged him to increase water intake  -On CT CAP he has multiple kidney cysts, likely benign.    4. Elevated BP  -He has had elevated BP in clinic the last 2 visits in 7 months.  -His PCP recently started him on Lisinopril.    5. Osteoarthritis -f/u with PCP   Plan -he will take Miralax today, if he has persistent pain after BM, he will call us back and see [place] tomorrow (Saturday clinic) -will call him next week for f/u -lab and f/u in 3 months, or sooner  if needed    No problem-specific Assessment & Plan notes found for this encounter.   No orders of the defined types were placed in this encounter.  I discussed the assessment and treatment plan with the patient. The patient was provided an opportunity to ask questions and all were answered. The patient agreed with the plan and demonstrated an understanding of the instructions.  The patient was advised to call back or seek an in-person evaluation if the symptoms worsen or if the condition fails to improve as anticipated.  I provided 15 minutes of non face-to-face telephone visit time during this encounter, and > 50% was spent counseling as documented under my assessment & plan.    Truitt Merle, MD 11/17/2019   I, Joslyn Devon, am acting as scribe for Truitt Merle, MD.   I have reviewed the above documentation for accuracy and completeness, and I agree with the above.

## 2019-11-17 ENCOUNTER — Telehealth: Payer: Self-pay

## 2019-11-17 ENCOUNTER — Encounter: Payer: Self-pay | Admitting: Hematology

## 2019-11-17 ENCOUNTER — Inpatient Hospital Stay: Payer: Medicare Other | Attending: Hematology | Admitting: Hematology

## 2019-11-17 ENCOUNTER — Other Ambulatory Visit: Payer: Medicare Other

## 2019-11-17 DIAGNOSIS — C186 Malignant neoplasm of descending colon: Secondary | ICD-10-CM

## 2019-11-17 NOTE — Telephone Encounter (Signed)
Patient called stating still no bowel movement, has only used one dose of Miralax, per Dr. Burr Medico instructed him to take at least two more doses of Miralax, if no relief call [place] in early am and speak to after hours nurse and asked to be seen in the morning.  The patient verbalized an understanding.

## 2019-11-18 ENCOUNTER — Encounter (HOSPITAL_COMMUNITY): Payer: Self-pay | Admitting: Emergency Medicine

## 2019-11-18 ENCOUNTER — Inpatient Hospital Stay (HOSPITAL_COMMUNITY)
Admission: EM | Admit: 2019-11-18 | Discharge: 2019-11-20 | DRG: 389 | Disposition: A | Discharge status: Home / Self Care | Payer: Medicare Other | Attending: Internal Medicine | Admitting: Internal Medicine

## 2019-11-18 ENCOUNTER — Observation Stay (HOSPITAL_COMMUNITY): Payer: Medicare Other

## 2019-11-18 ENCOUNTER — Telehealth: Payer: Self-pay | Admitting: Hematology

## 2019-11-18 ENCOUNTER — Other Ambulatory Visit: Payer: Self-pay

## 2019-11-18 ENCOUNTER — Emergency Department (HOSPITAL_COMMUNITY): Payer: Medicare Other

## 2019-11-18 DIAGNOSIS — Z85038 Personal history of other malignant neoplasm of large intestine: Secondary | ICD-10-CM

## 2019-11-18 DIAGNOSIS — I10 Essential (primary) hypertension: Secondary | ICD-10-CM

## 2019-11-18 DIAGNOSIS — Z87442 Personal history of urinary calculi: Secondary | ICD-10-CM

## 2019-11-18 DIAGNOSIS — R140 Abdominal distension (gaseous): Secondary | ICD-10-CM | POA: Diagnosis present

## 2019-11-18 DIAGNOSIS — E669 Obesity, unspecified: Secondary | ICD-10-CM | POA: Diagnosis present

## 2019-11-18 DIAGNOSIS — M1710 Unilateral primary osteoarthritis, unspecified knee: Secondary | ICD-10-CM | POA: Diagnosis present

## 2019-11-18 DIAGNOSIS — Z933 Colostomy status: Secondary | ICD-10-CM

## 2019-11-18 DIAGNOSIS — Z8249 Family history of ischemic heart disease and other diseases of the circulatory system: Secondary | ICD-10-CM

## 2019-11-18 DIAGNOSIS — K56600 Partial intestinal obstruction, unspecified as to cause: Secondary | ICD-10-CM | POA: Diagnosis not present

## 2019-11-18 DIAGNOSIS — Z20828 Contact with and (suspected) exposure to other viral communicable diseases: Secondary | ICD-10-CM | POA: Diagnosis present

## 2019-11-18 DIAGNOSIS — C186 Malignant neoplasm of descending colon: Secondary | ICD-10-CM | POA: Diagnosis not present

## 2019-11-18 DIAGNOSIS — N281 Cyst of kidney, acquired: Secondary | ICD-10-CM | POA: Diagnosis present

## 2019-11-18 DIAGNOSIS — M1990 Unspecified osteoarthritis, unspecified site: Secondary | ICD-10-CM | POA: Diagnosis present

## 2019-11-18 DIAGNOSIS — R739 Hyperglycemia, unspecified: Secondary | ICD-10-CM | POA: Diagnosis present

## 2019-11-18 DIAGNOSIS — Z6830 Body mass index (BMI) 30.0-30.9, adult: Secondary | ICD-10-CM

## 2019-11-18 DIAGNOSIS — E876 Hypokalemia: Secondary | ICD-10-CM | POA: Diagnosis present

## 2019-11-18 DIAGNOSIS — M179 Osteoarthritis of knee, unspecified: Secondary | ICD-10-CM | POA: Diagnosis present

## 2019-11-18 DIAGNOSIS — Z823 Family history of stroke: Secondary | ICD-10-CM

## 2019-11-18 DIAGNOSIS — M170 Bilateral primary osteoarthritis of knee: Secondary | ICD-10-CM

## 2019-11-18 DIAGNOSIS — K56609 Unspecified intestinal obstruction, unspecified as to partial versus complete obstruction: Secondary | ICD-10-CM

## 2019-11-18 DIAGNOSIS — R17 Unspecified jaundice: Secondary | ICD-10-CM | POA: Diagnosis present

## 2019-11-18 DIAGNOSIS — Z96651 Presence of right artificial knee joint: Secondary | ICD-10-CM | POA: Diagnosis present

## 2019-11-18 DIAGNOSIS — Z9049 Acquired absence of other specified parts of digestive tract: Secondary | ICD-10-CM

## 2019-11-18 DIAGNOSIS — D72829 Elevated white blood cell count, unspecified: Secondary | ICD-10-CM

## 2019-11-18 LAB — BASIC METABOLIC PANEL
Anion gap: 11 (ref 5–15)
BUN: 17 mg/dL (ref 8–23)
CO2: 26 mmol/L (ref 22–32)
Calcium: 9.4 mg/dL (ref 8.9–10.3)
Chloride: 104 mmol/L (ref 98–111)
Creatinine, Ser: 1.21 mg/dL (ref 0.61–1.24)
GFR calc Af Amer: >60 mL/min (ref >60)
GFR calc non Af Amer: 58 mL/min — ABNORMAL LOW (ref >60)
Glucose, Bld: 132 mg/dL — ABNORMAL HIGH (ref 70–99)
Potassium: 3.6 mmol/L (ref 3.5–5.1)
Sodium: 141 mmol/L (ref 135–145)

## 2019-11-18 LAB — URINALYSIS, ROUTINE W REFLEX MICROSCOPIC
Bilirubin Urine: NEGATIVE
Glucose, UA: NEGATIVE mg/dL
Ketones, ur: 5 mg/dL — AB
Leukocytes,Ua: NEGATIVE
Nitrite: NEGATIVE
Protein, ur: 100 mg/dL — AB
Specific Gravity, Urine: 1.026 (ref 1.005–1.030)
pH: 5 (ref 5.0–8.0)

## 2019-11-18 LAB — CBC WITH DIFFERENTIAL/PLATELET
Abs Immature Granulocytes: 0.1 10*3/uL — ABNORMAL HIGH (ref 0.00–0.07)
Basophils Absolute: 0 10*3/uL (ref 0.0–0.1)
Basophils Relative: 0 %
Eosinophils Absolute: 0 10*3/uL (ref 0.0–0.5)
Eosinophils Relative: 0 %
HCT: 50.1 % (ref 39.0–52.0)
Hemoglobin: 16.2 g/dL (ref 13.0–17.0)
Immature Granulocytes: 1 %
Lymphocytes Relative: 8 %
Lymphs Abs: 1.6 10*3/uL (ref 0.7–4.0)
MCH: 28.9 pg (ref 26.0–34.0)
MCHC: 32.3 g/dL (ref 30.0–36.0)
MCV: 89.5 fL (ref 80.0–100.0)
Monocytes Absolute: 1 10*3/uL (ref 0.1–1.0)
Monocytes Relative: 5 %
Neutro Abs: 16.6 10*3/uL — ABNORMAL HIGH (ref 1.7–7.7)
Neutrophils Relative %: 86 %
Platelets: 177 10*3/uL (ref 150–400)
RBC: 5.6 MIL/uL (ref 4.22–5.81)
RDW: 12.6 % (ref 11.5–15.5)
WBC: 19.3 10*3/uL — ABNORMAL HIGH (ref 4.0–10.5)
nRBC: 0 % (ref 0.0–0.2)

## 2019-11-18 LAB — HEPATIC FUNCTION PANEL
ALT: 19 U/L (ref 0–44)
AST: 17 U/L (ref 15–41)
Albumin: 4.1 g/dL (ref 3.5–5.0)
Alkaline Phosphatase: 75 U/L (ref 38–126)
Bilirubin, Direct: 0.3 mg/dL — ABNORMAL HIGH (ref 0.0–0.2)
Indirect Bilirubin: 1.1 mg/dL — ABNORMAL HIGH (ref 0.3–0.9)
Total Bilirubin: 1.4 mg/dL — ABNORMAL HIGH (ref 0.3–1.2)
Total Protein: 7.1 g/dL (ref 6.5–8.1)

## 2019-11-18 LAB — TSH: TSH: 0.384 u[IU]/mL (ref 0.350–4.500)

## 2019-11-18 LAB — LIPASE, BLOOD: Lipase: 25 U/L (ref 11–51)

## 2019-11-18 LAB — SARS CORONAVIRUS 2 (TAT 6-24 HRS): SARS Coronavirus 2: NEGATIVE

## 2019-11-18 MED ORDER — BISACODYL 10 MG RE SUPP: 10.0000 mg | Freq: Every day | RECTAL | Status: DC | PRN

## 2019-11-18 MED ORDER — SODIUM CHLORIDE 0.9 % IV SOLN: INTRAVENOUS | Status: AC

## 2019-11-18 MED ORDER — POLYETHYLENE GLYCOL 3350 17 G PO PACK
17.0000 g | PACK | Freq: Two times a day (BID) | ORAL | Status: DC
  Administered 2019-11-18 – 2019-11-20 (×4): 17 g via ORAL
  Filled 2019-11-18 (×4): qty 1

## 2019-11-18 MED ORDER — SODIUM CHLORIDE (PF) 0.9 % IJ SOLN
INTRAMUSCULAR | Status: AC
  Filled 2019-11-18: qty 50

## 2019-11-18 MED ORDER — ONDANSETRON HCL 4 MG PO TABS: 4.0000 mg | ORAL_TABLET | Freq: Four times a day (QID) | ORAL | Status: DC | PRN

## 2019-11-18 MED ORDER — ENOXAPARIN SODIUM 40 MG/0.4ML ~~LOC~~ SOLN
40.0000 mg | SUBCUTANEOUS | Status: DC
  Administered 2019-11-18 – 2019-11-19 (×2): 40 mg via SUBCUTANEOUS
  Filled 2019-11-18 (×2): qty 0.4

## 2019-11-18 MED ORDER — SENNOSIDES-DOCUSATE SODIUM 8.6-50 MG PO TABS
1.0000 | ORAL_TABLET | Freq: Two times a day (BID) | ORAL | Status: DC
  Administered 2019-11-18 – 2019-11-20 (×4): 1 via ORAL
  Filled 2019-11-18 (×4): qty 1

## 2019-11-18 MED ORDER — IOHEXOL 300 MG/ML  SOLN
100.0000 mL | Freq: Once | INTRAMUSCULAR | Status: AC | PRN
  Administered 2019-11-18: 100 mL via INTRAVENOUS

## 2019-11-18 MED ORDER — BRIMONIDINE TARTRATE 0.2 % OP SOLN
1.0000 [drp] | Freq: Two times a day (BID) | OPHTHALMIC | Status: DC
  Administered 2019-11-18 – 2019-11-20 (×4): 1 [drp] via OPHTHALMIC
  Filled 2019-11-18: qty 5

## 2019-11-18 MED ORDER — ONDANSETRON HCL 4 MG/2ML IJ SOLN: 4.0000 mg | Freq: Four times a day (QID) | INTRAMUSCULAR | Status: DC | PRN

## 2019-11-18 MED ORDER — SODIUM CHLORIDE 0.9 % IV BOLUS
1000.0000 mL | Freq: Once | INTRAVENOUS | Status: AC
  Administered 2019-11-18: 1000 mL via INTRAVENOUS

## 2019-11-18 MED ORDER — ACETAMINOPHEN 650 MG RE SUPP: 650.0000 mg | Freq: Four times a day (QID) | RECTAL | Status: DC | PRN

## 2019-11-18 MED ORDER — LISINOPRIL 10 MG PO TABS
10.0000 mg | ORAL_TABLET | Freq: Every day | ORAL | Status: DC
  Administered 2019-11-18 – 2019-11-20 (×3): 10 mg via ORAL
  Filled 2019-11-18 (×3): qty 1

## 2019-11-18 MED ORDER — ACETAMINOPHEN 325 MG PO TABS: 650.0000 mg | ORAL_TABLET | Freq: Four times a day (QID) | ORAL | Status: DC | PRN

## 2019-11-18 NOTE — Consult Note (Signed)
General Surgery [facility]  Reason for Consult: partial small bowel obstruction, abdominal pain  Referring Physician: Irena Cords, PA-C, [facility]  James Doyle is an 73 y.o. male.  HPI: Patient is a 73 year old male who presents to the emergency department on referral from his medical oncologist for evaluation of abdominal distention, abdominal discomfort, and constipation.  Patient has a history of partial colectomy in 2018 performed by Dr. 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  Patient subsequently underwent robotic takedown of his colostomy.  Patient notes intermittent problems with diarrhea and constipation since his surgery.  Over the past 4 days the patient has not had any significant bowel movements or flatus.  He had contacted his medical oncologist and was instructed to take MiraLAX.  The patient did not have any results after 3 doses and presented to the emergency department for evaluation.  CT scan of the abdomen and pelvis demonstrates dilated fluid-filled loops of small bowel mainly in the left upper quadrant of the abdomen.  There also appears to be some mesenteric edema.  There is not a clear transition point.  This may represent partial small bowel obstruction although it could represent some form of infectious enteritis.  Patient is being admitted to the medical service and surgery is asked to see the patient in consultation.  Patient has had no other abdominal surgery.  He is not had any prior admissions for small bowel obstruction.  In the emergency department this morning, the patient has had a large bowel movement and now feels symptomatically improved.  Past Medical History:  Diagnosis Date  . Aortic atherosclerosis ([place]) 12/27/2017   Noted on CT abd/pelvis  . Arthritis   . Cancer of left colon ([place]) 06/08/2017  . Cellulitis of left hand   . Chronic back pain   . Chronic pain of right knee   . Chronic right hip pain   .  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 DDD (degenerative disc disease), lumbar   . H/O vocal cord paralysis    left   . History of hypokalemia   . History of thumb fracture    Left  . Left nephrolithiasis 12/27/2017   Noted on CT abd/pelvis    Past Surgical History:  Procedure Laterality Date  . BACK SURGERY     times 2  . COLON RESECTION    . COLONOSCOPY    . COLOSTOMY    . COLOSTOMY TAKEDOWN N/A 02/09/2018   Procedure: LAPAROSCOPIC COLOSTOMY REVERSAL ERAS PATHWAY;  Surgeon: Leighton Ruff, MD;  Location: [facility];  Service: General;  Laterality: N/A;  . INNER EAR SURGERY     bilat  . LAPAROSCOPY N/A 06/09/2017   Procedure: LAPAROSCOPY DIAGNOSTIC, OPEN  LEFT COLECTOMY;  Surgeon: Leighton Ruff, MD;  Location: [facility];  Service: General;  Laterality: N/A;  . PROSTATE BIOPSY    . TONSILLECTOMY    . TOTAL KNEE ARTHROPLASTY Right 07/27/2016   Procedure: RIGHT TOTAL KNEE ARTHROPLASTY;  Surgeon: Gaynelle Arabian, MD;  Location: [facility];  Service: Orthopedics;  Laterality: Right;    Family History  Problem Relation Age of Onset  . Heart failure Mother   . Stroke Father   . Cancer Maternal Uncle        colon cancer    Social History:  reports that he has never smoked. He has never used smokeless tobacco. He reports that he does not drink alcohol or use drugs.  Allergies: No Known Allergies  Medications: I have reviewed the patient's current medications.  Results for orders placed or performed during the hospital encounter of 11/18/19 (from the past 48 hour(s))  CBC with Differential     Status: Abnormal   Collection Time: 11/18/19  6:49 AM  Result Value Ref Range   WBC 19.3 (H) 4.0 - 10.5 K/uL   RBC 5.60 4.22 - 5.81 MIL/uL   Hemoglobin 16.2 13.0 - 17.0 g/dL   HCT 50.1 39.0 - 52.0 %   MCV 89.5 80.0 - 100.0 fL   MCH 28.9 26.0 - 34.0 pg   MCHC 32.3 30.0 - 36.0 g/dL   RDW 12.6 11.5 - 15.5 %   Platelets 177 150 - 400 K/uL   nRBC 0.0 0.0 - 0.2 %    Neutrophils Relative % 86 %   Neutro Abs 16.6 (H) 1.7 - 7.7 K/uL   Lymphocytes Relative 8 %   Lymphs Abs 1.6 0.7 - 4.0 K/uL   Monocytes Relative 5 %   Monocytes Absolute 1.0 0.1 - 1.0 K/uL   Eosinophils Relative 0 %   Eosinophils Absolute 0.0 0.0 - 0.5 K/uL   Basophils Relative 0 %   Basophils Absolute 0.0 0.0 - 0.1 K/uL   Immature Granulocytes 1 %   Abs Immature Granulocytes 0.10 (H) 0.00 - 0.07 K/uL   Reactive, Benign Lymphocytes PRESENT     Comment: Performed at [facility], [place] [address] [place], [place] [id number]  Basic metabolic panel     Status: Abnormal   Collection Time: 11/18/19  6:49 AM  Result Value Ref Range   Sodium 141 135 - 145 mmol/L   Potassium 3.6 3.5 - 5.1 mmol/L   Chloride 104 98 - 111 mmol/L   CO2 26 22 - 32 mmol/L   Glucose, Bld 132 (H) 70 - 99 mg/dL   BUN 17 8 - 23 mg/dL   Creatinine, Ser 1.21 0.61 - 1.24 mg/dL   Calcium 9.4 8.9 - 10.3 mg/dL   GFR calc non Af Amer 58 (L) >60 mL/min   GFR calc Af Amer >60 >60 mL/min   Anion gap 11 5 - 15    Comment: Performed at [place] [address] [place], [place] [address]  Lipase, blood     Status: None   Collection Time: 11/18/19  6:49 AM  Result Value Ref Range   Lipase 25 11 - 51 U/L    Comment: Performed at [facility], [place] [address], [place], [place] [address]  Hepatic function panel     Status: Abnormal   Collection Time: 11/18/19  6:49 AM  Result Value Ref Range   Total Protein 7.1 6.5 - 8.1 g/dL   Albumin 4.1 3.5 - 5.0 g/dL   AST 17 15 - 41 U/L   ALT 19 0 - 44 U/L   Alkaline Phosphatase 75 38 - 126 U/L   Total Bilirubin 1.4 (H) 0.3 - 1.2 mg/dL   Bilirubin, Direct 0.3 (H) 0.0 - 0.2 mg/dL   Indirect Bilirubin 1.1 (H) 0.3 - 0.9 mg/dL    Comment: Performed at [facility], [place] [address] [place], [place] [address]  Urinalysis, Routine w reflex microscopic     Status: Abnormal   Collection Time: 11/18/19  8:23 AM   Result Value Ref Range   Color, Urine AMBER (A) YELLOW    Comment: BIOCHEMICALS MAY BE AFFECTED BY COLOR   APPearance HAZY (A) CLEAR   Specific Gravity, Urine 1.026 1.005 -  1.030   pH 5.0 5.0 - 8.0   Glucose, UA NEGATIVE NEGATIVE mg/dL   Hgb urine dipstick MODERATE (A) NEGATIVE   Bilirubin Urine NEGATIVE NEGATIVE   Ketones, ur 5 (A) NEGATIVE mg/dL   Protein, ur 100 (A) NEGATIVE mg/dL   Nitrite NEGATIVE NEGATIVE   Leukocytes,Ua NEGATIVE NEGATIVE   RBC / HPF 6-10 0 - 5 RBC/hpf   WBC, UA 0-5 0 - 5 WBC/hpf   Bacteria, UA RARE (A) NONE SEEN   Mucus PRESENT    Hyaline Casts, UA PRESENT    Ca Oxalate Crys, UA PRESENT     Comment: Performed at  [facility], [place] [address] [place], [place] [address]    CT Abdomen Pelvis W Contrast  Result Date: 11/18/2019 CLINICAL DATA:  73 year old male with diffuse abdominal pain, constipation EXAM: CT ABDOMEN AND PELVIS WITH CONTRAST TECHNIQUE: Multidetector CT imaging of the abdomen and pelvis was performed using the standard protocol following bolus administration of intravenous contrast. CONTRAST:  172mL OMNIPAQUE IOHEXOL 300 MG/ML  SOLN COMPARISON:  Most recent prior CT scan of the abdomen 07/17/2019; CT scan of the chest 07/20/2017 FINDINGS: Lower chest: Tiny subpleural nodule in the periphery of the right middle lobe remains unchanged dating back to August of 2018. Greater than 2 year stability is consistent with benignity. No further follow-up required. The lung bases are otherwise clear. Visualized cardiac structures are normal in size. No pericardial effusion. Unremarkable distal thoracic esophagus. Hepatobiliary: Normal hepatic contour and morphology. No discrete hepatic lesions. Normal appearance of the gallbladder. No intra or extrahepatic biliary ductal dilatation. Pancreas: Unremarkable. No pancreatic ductal dilatation or surrounding inflammatory changes. Spleen: The spleen itself is within normal limits. However, there is a  trace amount of perisplenic fluid extending to the subdiaphragmatic recess. Adrenals/Urinary Tract: The adrenal glands are normal. There are numerous circumscribed water attenuation simple cysts arising from the kidneys bilaterally. The largest right-sided renal cyst measures up to 7.2 cm. The largest left-sided renal cyst measures up to 8.8 cm. An intermediate density cyst exophytic from the lower pole of the left kidney measures 2.1 cm, slightly decreased compared to 2.4 cm in 2019. This likely represents a mildly complex or previously hemorrhagic cyst. No enhancing renal lesion identified. Punctate nonobstructing nephrolithiasis in the right interpolar collecting system. The ureters are unremarkable. The bladder is collapsed. Stomach/Bowel: Surgical changes of prior sigmoid colectomy with primary colo colonic anastomosis. No evidence of anastomotic stricture. No evidence of colonic wall thickening or inflammation. There are several loops of mildly dilated and fluid-filled small bowel in the left hemiabdomen with associated mesenteric edema and adjacent interloop fluid. No focal transition point evident. Vascular/Lymphatic: No suspicious lymphadenopathy. No significant atherosclerotic plaque, aneurysm or dissection. Reproductive: Prostatomegaly. Other: No abdominal wall hernia. Musculoskeletal: No acute fracture or aggressive appearing lytic or blastic osseous lesion. Surgical changes of prior L4-L5 posterior decompression and interbody graft placement. IMPRESSION: 1. Multiple loops of mildly dilated and fluid-filled small bowel in the left hemidiaphragm with associated mild mesenteric edema, interloop fluid and a small amount of ascites extending upward into the perisplenic/subdiaphragmatic space. No transition point is evident. Differential considerations include infectious enteritis, partial small bowel obstruction, or intermittent small bowel obstruction. 2. Surgical changes of prior sigmoid colectomy. 3.  Punctate nonobstructing nephrolithiasis in the interpolar right kidney. 4. Large bilateral renal cysts. Electronically Signed   By: Jacqulynn Cadet M.D.   On: 11/18/2019 08:51   US Abdomen Limited RUQ  Result Date: 11/18/2019 CLINICAL DATA:  Elevated bilirubin EXAM:  ULTRASOUND ABDOMEN LIMITED RIGHT UPPER QUADRANT COMPARISON:  CT scan November 18, 2019 FINDINGS: Gallbladder: The gallbladder is distended. There is no wall thickening, pericholecystic fluid, or Murphy's sign. No stones or sludge. Common bile duct: Diameter: 2.9 mm Liver: No focal lesion identified. Within normal limits in parenchymal echogenicity. Portal vein is patent on color Doppler imaging with normal direction of blood flow towards the liver. Other: Right renal cyst IMPRESSION: 1. The gallbladder is distended but otherwise normal in appearance. No stones or sludge. 2. No biliary duct dilatation. 3. Right renal cysts. Electronically Signed   By: Dorise Bullion III M.D   On: 11/18/2019 12:22    Review of Systems  Constitutional: Negative.   HENT: Negative.   Eyes: Negative.   Respiratory: Negative.   Cardiovascular: Negative.   Gastrointestinal: Positive for abdominal distention, abdominal pain and constipation.  Endocrine: Negative.   Genitourinary: Negative.   Musculoskeletal: Negative.   Skin: Negative.   Allergic/Immunologic: Negative.   Neurological: Negative.   Hematological: Negative.   Psychiatric/Behavioral: Negative.    Blood pressure (!) 132/92, pulse (!) 52, temperature 98 F (36.7 C), temperature source Oral, resp. rate 16, height 6\' 2"  (1.88 m), weight 106.6 kg, SpO2 97 %. Physical Exam  Constitutional: He is oriented to person, place, and time. He appears well-developed and well-nourished. No distress.  HENT:  Head: Normocephalic and atraumatic.  Right Ear: External ear normal.  Left Ear: External ear normal.  Eyes: Pupils are equal, round, and reactive to light. Conjunctivae are normal. No scleral  icterus.  Neck: No tracheal deviation present. No thyromegaly present.  Cardiovascular: Normal rate, regular rhythm and normal heart sounds.  No murmur heard. Respiratory: Effort normal and breath sounds normal. No respiratory distress. He has no wheezes.  GI: Soft. Bowel sounds are normal. He exhibits distension (mild). He exhibits no mass. There is no abdominal tenderness. There is no rebound and no guarding.  Well healed surgical incisions consistent with previous colorectal surgery and colostomy.  Musculoskeletal:        General: Edema (trace bilateral lower extremities) present. No deformity. Normal range of motion.     Cervical back: Normal range of motion and neck supple.  Neurological: He is alert and oriented to person, place, and time.  Skin: Skin is warm and dry. He is not diaphoretic.  Psychiatric: He has a normal mood and affect. His behavior is normal.    Assessment/Plan: Partial small bowel obstruction, likely chronic History of partial colectomy with colostomy History of colostomy takedown, robotic  Patient is being admitted to the medical service.  It sounds as though he has had chronic intermittent symptoms of partial small bowel obstruction with a history of intermittent constipation and diarrhea.  Patient did have a large bowel movement in the emergency department this morning with improvement in his symptoms.  At this time he does not have a nasogastric tube in place.  I would consider starting a trial of clear liquids and monitor him for 24 hours to see if he has continued improvement.  If so, then I think he likely will be able to go home once he is tolerating a diet.  He should follow-up with Dr. Leighton Ruff, his colorectal surgeon, so that she can evaluate his symptoms and his recent CT scan findings.  General surgery will follow the patient in consultation during his hospital stay.  No role for acute operative intervention.  Armandina Gemma, MD [facility] Office: [place]  11/18/2019, 12:50 PM

## 2019-11-18 NOTE — ED Provider Notes (Signed)
[facility] DEPT Provider Note   CSN: [id number] Arrival date & time: 11/18/19  [id number]     History Chief Complaint  Patient presents with  . Abdominal Pain    James Doyle is a 73 y.o. male.  HPI Patient presents to the emergency department with abdominal pain that started 5 days ago.  The patient states that he felt like he was getting constipated 5 days ago.  He states that he has had this type of episode in the past but usually quickly resolves with home treatments.  Patient states that since he had colon cancer that he has noticed that he has mainly looser stools rather than constipation but occasionally will have constipation.  Patient states that he tried over-the-counter remedies for his constipation without success.  Patient states that he has had some nausea but no vomiting.  Also states that he did not try any other medications prior to arrival other than medicines for constipation.  The patient denies chest pain, shortness of breath, headache,blurred vision, neck pain, fever, cough, weakness, numbness, dizziness, anorexia, edema,  vomiting, diarrhea, rash, back pain, dysuria, hematemesis, bloody stool, near syncope, or syncope.  Patient states that he had a virtual visit with his doctor who advised him to continue using the MiraLAX for continued treatment of what was thought to be constipation.    Past Medical History:  Diagnosis Date  . Aortic atherosclerosis ([place]) 12/27/2017   Noted on CT abd/pelvis  . Arthritis   . Cancer of left colon ([place]) 06/08/2017  . Cellulitis of left hand   . Chronic back pain   . Chronic pain of right knee   . Chronic right hip pain   . 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 DDD (degenerative disc disease), lumbar   . H/O vocal cord paralysis    left   . History of hypokalemia   . History of thumb fracture    Left  . Left nephrolithiasis 12/27/2017   Noted on CT abd/pelvis     Patient Active Problem List   Diagnosis Date Noted  . 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 Status post end colostomy takedown laparoscopically 02/09/2018 06/12/2017  . Cancer of left colon ([place]) 06/08/2017  . OA (osteoarthritis) of knee 07/27/2016    Past Surgical History:  Procedure Laterality Date  . BACK SURGERY     times 2  . COLON RESECTION    . COLONOSCOPY    . COLOSTOMY    . COLOSTOMY TAKEDOWN N/A 02/09/2018   Procedure: LAPAROSCOPIC COLOSTOMY REVERSAL ERAS PATHWAY;  Surgeon: Leighton Ruff, MD;  Location: [facility];  Service: General;  Laterality: N/A;  . INNER EAR SURGERY     bilat  . LAPAROSCOPY N/A 06/09/2017   Procedure: LAPAROSCOPY DIAGNOSTIC, OPEN  LEFT COLECTOMY;  Surgeon: Leighton Ruff, MD;  Location: [facility];  Service: General;  Laterality: N/A;  . PROSTATE BIOPSY    . TONSILLECTOMY    . TOTAL KNEE ARTHROPLASTY Right 07/27/2016   Procedure: RIGHT TOTAL KNEE ARTHROPLASTY;  Surgeon: Gaynelle Arabian, MD;  Location: [facility];  Service: Orthopedics;  Laterality: Right;       Family History  Problem Relation Age of Onset  . Heart failure Mother   . Stroke Father   . Cancer Maternal Uncle        colon cancer    Social History   Tobacco Use  . Smoking status: Never Smoker  . Smokeless tobacco: Never Used  Substance Use Topics  .  Alcohol use: No  . Drug use: No    Home Medications Prior to Admission medications   Medication Sig Start Date End Date Taking? Authorizing Provider  brimonidine (ALPHAGAN) 0.2 % ophthalmic solution Place 2 drops into both eyes daily. 11/15/19  Yes [provider]  diphenoxylate-atropine (LOMOTIL) 2.5-0.025 MG tablet TAKE 1 TABLET BY MOUTH FOUR TIMES DAILY AS NEEDED FOR DIARRHEA 06/29/19  Yes Truitt Merle, MD  lisinopril (ZESTRIL) 10 MG tablet Take 10 mg by mouth daily. 11/02/19  Yes [provider]  diphenoxylate-atropine (LOMOTIL) 2.5-0.025 MG tablet TAKE 1 TABLET BY MOUTH 4 TIMES DAILY AS  NEEDED FOR DIARRHEA Patient not taking: Reported on 11/18/2019 06/27/19   Truitt Merle, MD    Allergies    Patient has no known allergies.  Review of Systems   Review of Systems All other systems negative except as documented in the HPI. All pertinent positives and negatives as reviewed in the HPI. Physical Exam Updated Vital Signs BP (!) 150/89 (BP Location: Right Arm)   Pulse 69   Temp 98 F (36.7 C) (Oral)   Resp 16   Ht 6\' 2"  (1.88 m)   Wt 106.6 kg   SpO2 95%   BMI 30.17 kg/m   Physical Exam Vitals and nursing note reviewed.  Constitutional:      General: He is not in acute distress.    Appearance: He is well-developed.  HENT:     Head: Normocephalic and atraumatic.  Eyes:     Pupils: Pupils are equal, round, and reactive to light.  Cardiovascular:     Rate and Rhythm: Normal rate and regular rhythm.     Heart sounds: Normal heart sounds. No murmur. No friction rub. No gallop.   Pulmonary:     Effort: Pulmonary effort is normal. No respiratory distress.     Breath sounds: Normal breath sounds. No wheezing.  Abdominal:     General: Bowel sounds are normal. There is no distension.     Palpations: Abdomen is soft.     Tenderness: There is generalized abdominal tenderness. There is guarding. There is no rebound.  Musculoskeletal:     Cervical back: Normal range of motion and neck supple.  Skin:    General: Skin is warm and dry.     Capillary Refill: Capillary refill takes less than 2 seconds.     Findings: No erythema or rash.  Neurological:     Mental Status: He is alert and oriented to person, place, and time.     Motor: No abnormal muscle tone.     Coordination: Coordination normal.  Psychiatric:        Behavior: Behavior normal.     ED Results / Procedures / Treatments   Labs (all labs ordered are listed, but only abnormal results are displayed) Labs Reviewed  CBC WITH DIFFERENTIAL/PLATELET - Abnormal; Notable for the following components:      Result  Value   WBC 19.3 (*)    Neutro Abs 16.6 (*)    Abs Immature Granulocytes 0.10 (*)    All other components within normal limits  BASIC METABOLIC PANEL - Abnormal; Notable for the following components:   Glucose, Bld 132 (*)    GFR calc non Af Amer 58 (*)    All other components within normal limits  URINALYSIS, ROUTINE W REFLEX MICROSCOPIC - Abnormal; Notable for the following components:   Color, Urine AMBER (*)    APPearance HAZY (*)    Hgb urine dipstick MODERATE (*)  Ketones, ur 5 (*)    Protein, ur 100 (*)    Bacteria, UA RARE (*)    All other components within normal limits  HEPATIC FUNCTION PANEL - Abnormal; Notable for the following components:   Total Bilirubin 1.4 (*)    Bilirubin, Direct 0.3 (*)    Indirect Bilirubin 1.1 (*)    All other components within normal limits  SARS CORONAVIRUS 2 (TAT 6-24 HRS)  LIPASE, BLOOD    EKG None  Radiology CT Abdomen Pelvis W Contrast  Result Date: 11/18/2019 CLINICAL DATA:  73 year old male with diffuse abdominal pain, constipation EXAM: CT ABDOMEN AND PELVIS WITH CONTRAST TECHNIQUE: Multidetector CT imaging of the abdomen and pelvis was performed using the standard protocol following bolus administration of intravenous contrast. CONTRAST:  125mL OMNIPAQUE IOHEXOL 300 MG/ML  [facility] COMPARISON:  Most recent prior CT scan of the abdomen 07/17/2019; CT scan of the chest 07/20/2017 FINDINGS: Lower chest: Tiny subpleural nodule in the periphery of the right middle lobe remains unchanged dating back to August of 2018. Greater than 2 year stability is consistent with benignity. No further follow-up required. The lung bases are otherwise clear. Visualized cardiac structures are normal in size. No pericardial effusion. Unremarkable distal thoracic esophagus. Hepatobiliary: Normal hepatic contour and morphology. No discrete hepatic lesions. Normal appearance of the gallbladder. No intra or extrahepatic biliary ductal dilatation. Pancreas:  Unremarkable. No pancreatic ductal dilatation or surrounding inflammatory changes. Spleen: The spleen itself is within normal limits. However, there is a trace amount of perisplenic fluid extending to the subdiaphragmatic recess. Adrenals/Urinary Tract: The adrenal glands are normal. There are numerous circumscribed water attenuation simple cysts arising from the kidneys bilaterally. The largest right-sided renal cyst measures up to 7.2 cm. The largest left-sided renal cyst measures up to 8.8 cm. An intermediate density cyst exophytic from the lower pole of the left kidney measures 2.1 cm, slightly decreased compared to 2.4 cm in 2019. This likely represents a mildly complex or previously hemorrhagic cyst. No enhancing renal lesion identified. Punctate nonobstructing nephrolithiasis in the right interpolar collecting system. The ureters are unremarkable. The bladder is collapsed. Stomach/Bowel: Surgical changes of prior sigmoid colectomy with primary colo colonic anastomosis. No evidence of anastomotic stricture. No evidence of colonic wall thickening or inflammation. There are several loops of mildly dilated and fluid-filled small bowel in the left hemiabdomen with associated mesenteric edema and adjacent interloop fluid. No focal transition point evident. Vascular/Lymphatic: No suspicious lymphadenopathy. No significant atherosclerotic plaque, aneurysm or dissection. Reproductive: Prostatomegaly. Other: No abdominal wall hernia. Musculoskeletal: No acute fracture or aggressive appearing lytic or blastic osseous lesion. Surgical changes of prior L4-L5 posterior decompression and interbody graft placement. IMPRESSION: 1. Multiple loops of mildly dilated and fluid-filled small bowel in the left hemidiaphragm with associated mild mesenteric edema, interloop fluid and a small amount of ascites extending upward into the perisplenic/subdiaphragmatic space. No transition point is evident. Differential considerations  include infectious enteritis, partial small bowel obstruction, or intermittent small bowel obstruction. 2. Surgical changes of prior sigmoid colectomy. 3. Punctate nonobstructing nephrolithiasis in the interpolar right kidney. 4. Large bilateral renal cysts. Electronically Signed   By: Jacqulynn Cadet M.D.   On: 11/18/2019 08:51    Procedures Procedures (including critical care time)  Medications Ordered in ED Medications  sodium chloride 0.9 % bolus 1,000 mL (0 mLs Intravenous Stopped 11/18/19 0929)  iohexol (OMNIPAQUE) 300 MG/ML solution 100 mL (100 mLs Intravenous Contrast Given 11/18/19 0756)  sodium chloride (PF) 0.9 % injection (  Given  by Other 11/18/19 [id number])    ED Course  I have reviewed the triage vital signs and the nursing notes.  Pertinent labs & imaging results that were available during my care of the patient were reviewed by me and considered in my medical decision making (see chart for details).    MDM Rules/Calculators/A&P                     I spoke with the [facility] hospitalist about the patient and his findings.  The patient could have an infectious versus obstructive versus ischemic process.  Patient will be admitted to the hospital for further evaluation and care.  They advised they would like for me to speak with general surgery about further evaluation and consultation of this patient. nical Impression(s) / ED Diagnoses Final diagnoses:  Partial intestinal obstruction, unspecified cause [facility])    Rx / DC Orders ED Discharge Orders    None       Dalia Heading, PA-C 11/18/19 [place], April, MD 12/08/19 2319

## 2019-11-18 NOTE — Telephone Encounter (Signed)
Scheduled appt per 12/18 los.  Spoke with pt and they are aware of the appt date and time.

## 2019-11-18 NOTE — H&P (Signed)
History and Physical    James Doyle [id number] DOB: Feb 15, 1946 DOA: 11/18/2019  PCP: Alroy Dust, L.Marlou Sa, MD   Patient coming from: Home  Chief Complaint: Abdominal Pain, Distention, and Constipation   HPI: James Doyle is a 73 y.o. male with PMH significant for but not limited too osteoarthritis, history of left-sided colon cancer status post colectomy and colostomy with colostomy reversal and takedown, history of hyperkalemia, history of nephrolithiasis, hypertension, as well as other comorbidities who presents with a chief complaint of abdominal pain and distention with inability to have a bowel movement for at least 4 days and pass gas.  Patient states that he started having symptoms about 4 days ago when he still has medical oncologist via telemedicine visit where she recommended taking MiraLAX.  Patient doubled his MiraLAX and was unable to have a bowel movement and still unable to pass gas he presented to the emergency room with above chief complaints.  He denies any chest pain, lightheadedness or dizziness but states that he did have some abdominal distention and states that he did not have any flatus or bowel movements in almost 4 to 5 days.  Patient also states that he had some nausea but no actual vomiting with these associated symptoms.  Had not tried any other medications for constipation besides MiraLAX.  He denies any lightheadedness or dizziness and denied any other concerns or complaints.  TRH was asked to admit this patient for a possible small bowel obstruction and General Surgery was consulted for further evaluation as well  ED Course: In the ED patient had basic blood work done and also had a CT of the abdomen pelvis.  He was given a 1 L normal saline bolus and a SARS-CoV-2 testing was obtained but still pending results.  Patient also a urinalysis in the ED which showed some moderate hemoglobin but negative leukocytes and negative nitrites.  Subsequently in the ED patient had a  bowel movement just prior to my arrival.  Review of Systems: As per HPI otherwise all other systems reviewed and negative.   Past Medical History:  Diagnosis Date  . Aortic atherosclerosis ([place]) 12/27/2017   Noted on CT abd/pelvis  . Arthritis   . Cancer of left colon ([place]) 06/08/2017  . Cellulitis of left hand   . Chronic back pain   . Chronic pain of right knee   . Chronic right hip pain   . 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 DDD (degenerative disc disease), lumbar   . H/O vocal cord paralysis    left   . History of hypokalemia   . History of thumb fracture    Left  . Left nephrolithiasis 12/27/2017   Noted on CT abd/pelvis   Past Surgical History:  Procedure Laterality Date  . BACK SURGERY     times 2  . COLON RESECTION    . COLONOSCOPY    . COLOSTOMY    . COLOSTOMY TAKEDOWN N/A 02/09/2018   Procedure: LAPAROSCOPIC COLOSTOMY REVERSAL ERAS PATHWAY;  Surgeon: Leighton Ruff, MD;  Location: [facility];  Service: General;  Laterality: N/A;  . INNER EAR SURGERY     bilat  . LAPAROSCOPY N/A 06/09/2017   Procedure: LAPAROSCOPY DIAGNOSTIC, OPEN  LEFT COLECTOMY;  Surgeon: Leighton Ruff, MD;  Location: [facility];  Service: General;  Laterality: N/A;  . PROSTATE BIOPSY    . TONSILLECTOMY    . TOTAL KNEE ARTHROPLASTY Right 07/27/2016   Procedure: RIGHT TOTAL KNEE ARTHROPLASTY;  Surgeon: Gaynelle Arabian, MD;  Location: [facility];  Service: Orthopedics;  Laterality: Right;   SOCIAL HISTORY  reports that he has never smoked. He has never used smokeless tobacco. He reports that he does not drink alcohol or use drugs.  ALLERGIES No Known Allergies  Family History  Problem Relation Age of Onset  . Heart failure Mother   . Stroke Father   . Cancer Maternal Uncle        colon cancer   Prior to Admission medications   Medication Sig Start Date End Date Taking? Authorizing Provider  brimonidine (ALPHAGAN) 0.2 % ophthalmic solution Place 2 drops into both eyes  daily. 11/15/19  Yes [provider]  diphenoxylate-atropine (LOMOTIL) 2.5-0.025 MG tablet TAKE 1 TABLET BY MOUTH FOUR TIMES DAILY AS NEEDED FOR DIARRHEA 06/29/19  Yes Truitt Merle, MD  lisinopril (ZESTRIL) 10 MG tablet Take 10 mg by mouth daily. 11/02/19  Yes [provider]  diphenoxylate-atropine (LOMOTIL) 2.5-0.025 MG tablet TAKE 1 TABLET BY MOUTH 4 TIMES DAILY AS NEEDED FOR DIARRHEA Patient not taking: Reported on 11/18/2019 06/27/19   Truitt Merle, MD   Physical Exam: Vitals:   11/18/19 1300 11/18/19 1400 11/18/19 1500 11/18/19 1559  BP: (!) 166/99 (!) 153/80 (!) 163/85 (!) 154/88  Pulse: 69 65 (!) 55 61  Resp: 17 15 17 18   Temp:    98.2 F (36.8 C)  TempSrc:    Oral  SpO2: 97% 96% 97% 99%  Weight:      Height:       Constitutional: WN/WD, NAD and appears calm and comfortable Eyes: PERRL, lids and conjunctivae normal, sclerae anicteric  ENMT: External Ears, Nose appear normal. Grossly normal hearing. Mucous membranes are moist. Posterior pharynx clear of any exudate or lesions. Normal dentition.  Neck: Appears normal, supple, no cervical masses, normal ROM, no appreciable thyromegaly Respiratory: Clear to auscultation bilaterally, no wheezing, rales, rhonchi or crackles. Normal respiratory effort and patient is not tachypenic. No accessory muscle use.  Cardiovascular: RRR, no murmurs / rubs / gallops. S1 and S2 auscultated. No extremity edema. 2+ pedal pulses. No carotid bruits.  Abdomen: Soft, non-tender, non-distended. No masses palpated. No appreciable hepatosplenomegaly. Bowel sounds positive.  GU: Deferred. Musculoskeletal: No clubbing / cyanosis of digits/nails. No joint deformity upper and lower extremities. Good ROM, no contractures. Normal strength and muscle tone.  Skin: No rashes, lesions, ulcers. No induration; Warm and dry.  Neurologic: CN 2-12 grossly intact with no focal deficits. Sensation intact in all 4 Extremities, DTR normal. Strength 5/5 in all 4.  Romberg sign cerebellar reflexes not assessed.  Psychiatric: Normal judgment and insight. Alert and oriented x 3. Normal mood and appropriate affect.   Labs on Admission: I have personally reviewed following labs and imaging studies  CBC: Recent Labs  Lab 11/18/19 0649  WBC 19.3*  NEUTROABS 16.6*  HGB 16.2  HCT 50.1  MCV 89.5  PLT 123XX123   Basic Metabolic Panel: Recent Labs  Lab 11/18/19 0649  NA 141  K 3.6  CL 104  CO2 26  GLUCOSE 132*  BUN 17  CREATININE 1.21  CALCIUM 9.4   GFR: Estimated Creatinine Clearance: 68.6 mL/min (by C-G formula based on SCr of 1.21 mg/dL). Liver Function Tests: Recent Labs  Lab 11/18/19 0649  AST 17  ALT 19  ALKPHOS 75  BILITOT 1.4*  PROT 7.1  ALBUMIN 4.1   Recent Labs  Lab 11/18/19 0649  LIPASE 25   No results for input(s): AMMONIA in the last 168 hours. Coagulation Profile: No results  for input(s): INR, PROTIME in the last 168 hours. Cardiac Enzymes: No results for input(s): CKTOTAL, CKMB, CKMBINDEX, TROPONINI in the last 168 hours. BNP (last 3 results) No results for input(s): PROBNP in the last 8760 hours. HbA1C: No results for input(s): HGBA1C in the last 72 hours. CBG: No results for input(s): GLUCAP in the last 168 hours. Lipid Profile: No results for input(s): CHOL, HDL, LDLCALC, TRIG, CHOLHDL, LDLDIRECT in the last 72 hours. Thyroid Function Tests: No results for input(s): TSH, T4TOTAL, FREET4, T3FREE, THYROIDAB in the last 72 hours. Anemia Panel: No results for input(s): VITAMINB12, FOLATE, FERRITIN, TIBC, IRON, RETICCTPCT in the last 72 hours. Urine analysis:    Component Value Date/Time   COLORURINE AMBER (A) 11/18/2019 0823   APPEARANCEUR HAZY (A) 11/18/2019 0823   LABSPEC 1.026 11/18/2019 0823   PHURINE 5.0 11/18/2019 0823   GLUCOSEU NEGATIVE 11/18/2019 0823   HGBUR MODERATE (A) 11/18/2019 0823   BILIRUBINUR NEGATIVE 11/18/2019 0823   KETONESUR 5 (A) 11/18/2019 0823   PROTEINUR 100 (A) 11/18/2019 0823    NITRITE NEGATIVE 11/18/2019 0823   LEUKOCYTESUR NEGATIVE 11/18/2019 0823   Sepsis Labs: !!!!!!!!!!!!!!!!!!!!!!!!!!!!!!!!!!!!!!!!!!!! @LABRCNTIP (procalcitonin:4,lacticidven:4) )No results found for this or any previous visit (from the past 240 hour(s)).   Radiological Exams on Admission: CT Abdomen Pelvis W Contrast  Result Date: 11/18/2019 CLINICAL DATA:  73 year old male with diffuse abdominal pain, constipation EXAM: CT ABDOMEN AND PELVIS WITH CONTRAST TECHNIQUE: Multidetector CT imaging of the abdomen and pelvis was performed using the standard protocol following bolus administration of intravenous contrast. CONTRAST:  167mL OMNIPAQUE IOHEXOL 300 MG/ML  SOLN COMPARISON:  Most recent prior CT scan of the abdomen 07/17/2019; CT scan of the chest 07/20/2017 FINDINGS: Lower chest: Tiny subpleural nodule in the periphery of the right middle lobe remains unchanged dating back to August of 2018. Greater than 2 year stability is consistent with benignity. No further follow-up required. The lung bases are otherwise clear. Visualized cardiac structures are normal in size. No pericardial effusion. Unremarkable distal thoracic esophagus. Hepatobiliary: Normal hepatic contour and morphology. No discrete hepatic lesions. Normal appearance of the gallbladder. No intra or extrahepatic biliary ductal dilatation. Pancreas: Unremarkable. No pancreatic ductal dilatation or surrounding inflammatory changes. Spleen: The spleen itself is within normal limits. However, there is a trace amount of perisplenic fluid extending to the subdiaphragmatic recess. Adrenals/Urinary Tract: The adrenal glands are normal. There are numerous circumscribed water attenuation simple cysts arising from the kidneys bilaterally. The largest right-sided renal cyst measures up to 7.2 cm. The largest left-sided renal cyst measures up to 8.8 cm. An intermediate density cyst exophytic from the lower pole of the left kidney measures 2.1 cm, slightly  decreased compared to 2.4 cm in 2019. This likely represents a mildly complex or previously hemorrhagic cyst. No enhancing renal lesion identified. Punctate nonobstructing nephrolithiasis in the right interpolar collecting system. The ureters are unremarkable. The bladder is collapsed. Stomach/Bowel: Surgical changes of prior sigmoid colectomy with primary colo colonic anastomosis. No evidence of anastomotic stricture. No evidence of colonic wall thickening or inflammation. There are several loops of mildly dilated and fluid-filled small bowel in the left hemiabdomen with associated mesenteric edema and adjacent interloop fluid. No focal transition point evident. Vascular/Lymphatic: No suspicious lymphadenopathy. No significant atherosclerotic plaque, aneurysm or dissection. Reproductive: Prostatomegaly. Other: No abdominal wall hernia. Musculoskeletal: No acute fracture or aggressive appearing lytic or blastic osseous lesion. Surgical changes of prior L4-L5 posterior decompression and interbody graft placement. IMPRESSION: 1. Multiple loops of mildly dilated and fluid-filled small  bowel in the left hemidiaphragm with associated mild mesenteric edema, interloop fluid and a small amount of ascites extending upward into the perisplenic/subdiaphragmatic space. No transition point is evident. Differential considerations include infectious enteritis, partial small bowel obstruction, or intermittent small bowel obstruction. 2. Surgical changes of prior sigmoid colectomy. 3. Punctate nonobstructing nephrolithiasis in the interpolar right kidney. 4. Large bilateral renal cysts. Electronically Signed   By: Jacqulynn Cadet M.D.   On: 11/18/2019 08:51   US Abdomen Limited RUQ  Result Date: 11/18/2019 CLINICAL DATA:  Elevated bilirubin EXAM: ULTRASOUND ABDOMEN LIMITED RIGHT UPPER QUADRANT COMPARISON:  CT scan November 18, 2019 FINDINGS: Gallbladder: The gallbladder is distended. There is no wall thickening,  pericholecystic fluid, or Murphy's sign. No stones or sludge. Common bile duct: Diameter: 2.9 mm Liver: No focal lesion identified. Within normal limits in parenchymal echogenicity. Portal vein is patent on color Doppler imaging with normal direction of blood flow towards the liver. Other: Right renal cyst IMPRESSION: 1. The gallbladder is distended but otherwise normal in appearance. No stones or sludge. 2. No biliary duct dilatation. 3. Right renal cysts. Electronically Signed   By: Dorise Bullion III M.D   On: 11/18/2019 12:22   EKG: No EKG was done on arrival to the emergency department so we will order one now.  Assessment/Plan Principal Problem:   Small bowel obstruction, partial (HCC) Active Problems:   OA (osteoarthritis) of knee   Cancer of left colon ([facility]CC)   Colon obstruction s/p colectomy/colostomy 06/09/2017   Status post end colostomy takedown laparoscopically 02/09/2018   Abdominal distention   Essential hypertension   Hyperbilirubinemia   Leukocytosis  Partial SBO -Placed in Observation Telemetry -Patient had not had a bowel movement in 4 days and was not passing very much status and had some abdominal distention associated nausea -Was advised to take MiraLAX by his primary oncologist but because of no relief he presented to the emergency room for further evaluation today -CT scan of the abdomen pelvis showed "Multiple loops of mildly dilated and fluid-filled small bowel in the left hemidiaphragm with associated mild mesenteric edema,  interloop fluid and a small amount of ascites extending upward into the perisplenic/subdiaphragmatic space. No transition point is evident. Differential considerations include infectious enteritis, partial small bowel obstruction, or intermittent small bowel obstruction. Surgical changes of prior sigmoid colectomy. Punctate nonobstructing nephrolithiasis in the interpolar right kidney. Large bilateral renal cysts" -Started gentle IV fluid hydration  with normal saline.  75 mL's per hour for 1 day -General surgery was consulted for further evaluation recommendations -Patient subsequently had a large bowel movement in the ED and felt better symptomatically -Continue with supportive care and encourage ambulation and start bowel regimen with MiraLAX 17 twice daily along with senna docusate 1 tab p.o. twice daily and bisacodyl 10 mg rectal suppository daily as needed -Placed on a clear liquid diet advance diet as tolerated next-appreciate further surgical evaluation recommendations -Judicious use of Narcotics  -We will obtain a KUB now and repeat one in a.m. -Of Note SARS-CoV-2 pending    Hypertension -Continue lisinopril 10 mg p.o. daily -Continue to Monitor BP per Protocols;  -BP was 154/88 this AM   Leukocytosis -In the setting of Partial SBO -Continue to Monitor for S/Sx of Infection -Repeat CBC in AM   Hyperglycemia -Patient's Blood Sugar was 132 -Check HbA1c in AM -Continue to Monitor Blood Sugars Carefully -If Consistently elevated will place on Sensitive SSI AC  Hyperbilirubinemia -Patient's T Bili was 1.4 with Indirect being 1.1  and Direct being 0.3 -Check RUQ U/S and showed "The gallbladder is distended but otherwise normal in appearance. No stones or sludge. No biliary duct dilatation. . Right renal cysts." -Continue to Monitor and Trend -Repeat CMP in AM   Hx of Left sided Colon Cancer with Colon Obstruction s/p Colon Resection/Colectomy and Colostomy with Subsequent Colostomy Takedown -Follow up as an outpatient with Medical Oncology Dr. Burr Medico  -Gen Surgery consulted for Above Complaints -They feel like no Surgical Intervention is required today   Osteoarthritis -C/w Acetaminophen 650 mg po/RC q6hprn Mild pain   Obesity -Estimated body mass index is 30.17 kg/m as calculated from the following:   Height as of this encounter: 6\' 2"  (1.88 m).   Weight as of this encounter: 106.6 kg. -Weight Loss and Dietary  Counseling given   DVT prophylaxis: Enoxaparin 40 mg sq q24h Code Status: FULL CODE  Family Communication: No family present at bedside  Disposition Plan: Pending Clinical Improvement and Clearance by General Surgery  Consults called: General Surgery  Admission status: Observation Med-Surge  Severity of Illness: The appropriate patient status for this patient is OBSERVATION. Observation status is judged to be reasonable and necessary in order to provide the required intensity of service to ensure the patient's safety. The patient's presenting symptoms, physical exam findings, and initial radiographic and laboratory data in the context of their medical condition is felt to place them at decreased risk for further clinical deterioration. Furthermore, it is anticipated that the patient will be medically stable for discharge from the hospital within 2 midnights of admission. The following factors support the patient status of observation.   " The patient's presenting symptoms include Abdominal Pain, Distention, Nausea and inability to have a BM or Pass Flatus. " The physical exam findings include Abdominal Distention. " The initial radiographic and laboratory data are concerning for a Partial SBO  Kerney Elbe, D.O. [facility] PAGER is on [place]  If 7PM-7AM, please contact night-coverage [email or web address]  11/18/2019, 4:19 PM

## 2019-11-18 NOTE — ED Triage Notes (Addendum)
Patient complaining of abdominal pain. Patient states he has not had a bowel movement for four days. Patient is complaining of pain. No other symptoms. Patient states he has taken miralax per dr office and states it is not working.

## 2019-11-18 NOTE — ED Notes (Signed)
Hospitalist at bedside with patient.

## 2019-11-18 NOTE — ED Notes (Signed)
Patient transported to CT 

## 2019-11-18 NOTE — ED Notes (Signed)
ED PA at bedside

## 2019-11-19 ENCOUNTER — Observation Stay (HOSPITAL_COMMUNITY): Payer: Medicare Other

## 2019-11-19 DIAGNOSIS — K56600 Partial intestinal obstruction, unspecified as to cause: Secondary | ICD-10-CM | POA: Diagnosis present

## 2019-11-19 DIAGNOSIS — R739 Hyperglycemia, unspecified: Secondary | ICD-10-CM | POA: Diagnosis present

## 2019-11-19 DIAGNOSIS — D72829 Elevated white blood cell count, unspecified: Secondary | ICD-10-CM | POA: Diagnosis present

## 2019-11-19 DIAGNOSIS — M1710 Unilateral primary osteoarthritis, unspecified knee: Secondary | ICD-10-CM | POA: Diagnosis present

## 2019-11-19 DIAGNOSIS — E876 Hypokalemia: Secondary | ICD-10-CM | POA: Diagnosis present

## 2019-11-19 DIAGNOSIS — M1990 Unspecified osteoarthritis, unspecified site: Secondary | ICD-10-CM | POA: Diagnosis present

## 2019-11-19 DIAGNOSIS — C186 Malignant neoplasm of descending colon: Secondary | ICD-10-CM | POA: Diagnosis not present

## 2019-11-19 DIAGNOSIS — R140 Abdominal distension (gaseous): Secondary | ICD-10-CM | POA: Diagnosis not present

## 2019-11-19 DIAGNOSIS — Z8249 Family history of ischemic heart disease and other diseases of the circulatory system: Secondary | ICD-10-CM | POA: Diagnosis not present

## 2019-11-19 DIAGNOSIS — E669 Obesity, unspecified: Secondary | ICD-10-CM | POA: Diagnosis present

## 2019-11-19 DIAGNOSIS — Z9049 Acquired absence of other specified parts of digestive tract: Secondary | ICD-10-CM | POA: Diagnosis not present

## 2019-11-19 DIAGNOSIS — Z87442 Personal history of urinary calculi: Secondary | ICD-10-CM | POA: Diagnosis not present

## 2019-11-19 DIAGNOSIS — Z20828 Contact with and (suspected) exposure to other viral communicable diseases: Secondary | ICD-10-CM | POA: Diagnosis present

## 2019-11-19 DIAGNOSIS — K56609 Unspecified intestinal obstruction, unspecified as to partial versus complete obstruction: Secondary | ICD-10-CM

## 2019-11-19 DIAGNOSIS — R17 Unspecified jaundice: Secondary | ICD-10-CM | POA: Diagnosis present

## 2019-11-19 DIAGNOSIS — Z823 Family history of stroke: Secondary | ICD-10-CM | POA: Diagnosis not present

## 2019-11-19 DIAGNOSIS — Z85038 Personal history of other malignant neoplasm of large intestine: Secondary | ICD-10-CM | POA: Diagnosis not present

## 2019-11-19 DIAGNOSIS — N281 Cyst of kidney, acquired: Secondary | ICD-10-CM | POA: Diagnosis present

## 2019-11-19 DIAGNOSIS — I10 Essential (primary) hypertension: Secondary | ICD-10-CM | POA: Diagnosis present

## 2019-11-19 DIAGNOSIS — Z96651 Presence of right artificial knee joint: Secondary | ICD-10-CM | POA: Diagnosis present

## 2019-11-19 DIAGNOSIS — Z6830 Body mass index (BMI) 30.0-30.9, adult: Secondary | ICD-10-CM | POA: Diagnosis not present

## 2019-11-19 DIAGNOSIS — Z933 Colostomy status: Secondary | ICD-10-CM | POA: Diagnosis not present

## 2019-11-19 LAB — COMPREHENSIVE METABOLIC PANEL
ALT: 16 U/L (ref 0–44)
AST: 17 U/L (ref 15–41)
Albumin: 3.5 g/dL (ref 3.5–5.0)
Alkaline Phosphatase: 66 U/L (ref 38–126)
Anion gap: 10 (ref 5–15)
BUN: 13 mg/dL (ref 8–23)
CO2: 23 mmol/L (ref 22–32)
Calcium: 8.7 mg/dL — ABNORMAL LOW (ref 8.9–10.3)
Chloride: 108 mmol/L (ref 98–111)
Creatinine, Ser: 1.17 mg/dL (ref 0.61–1.24)
GFR calc Af Amer: >60 mL/min (ref >60)
GFR calc non Af Amer: >60 mL/min (ref >60)
Glucose, Bld: 105 mg/dL — ABNORMAL HIGH (ref 70–99)
Potassium: 3.4 mmol/L — ABNORMAL LOW (ref 3.5–5.1)
Sodium: 141 mmol/L (ref 135–145)
Total Bilirubin: 0.8 mg/dL (ref 0.3–1.2)
Total Protein: 6.4 g/dL — ABNORMAL LOW (ref 6.5–8.1)

## 2019-11-19 LAB — CBC
HCT: 43.8 % (ref 39.0–52.0)
Hemoglobin: 14 g/dL (ref 13.0–17.0)
MCH: 29 pg (ref 26.0–34.0)
MCHC: 32 g/dL (ref 30.0–36.0)
MCV: 90.7 fL (ref 80.0–100.0)
Platelets: 149 10*3/uL — ABNORMAL LOW (ref 150–400)
RBC: 4.83 MIL/uL (ref 4.22–5.81)
RDW: 12.7 % (ref 11.5–15.5)
WBC: 9.1 10*3/uL (ref 4.0–10.5)
nRBC: 0 % (ref 0.0–0.2)

## 2019-11-19 LAB — MAGNESIUM: Magnesium: 2.1 mg/dL (ref 1.7–2.4)

## 2019-11-19 LAB — PHOSPHORUS: Phosphorus: 3.7 mg/dL (ref 2.5–4.6)

## 2019-11-19 LAB — GLUCOSE, CAPILLARY: Glucose-Capillary: 101 mg/dL — ABNORMAL HIGH (ref 70–99)

## 2019-11-19 MED ORDER — POTASSIUM CHLORIDE CRYS ER 20 MEQ PO TBCR
40.0000 meq | EXTENDED_RELEASE_TABLET | Freq: Once | ORAL | Status: AC
  Administered 2019-11-19: 40 meq via ORAL
  Filled 2019-11-19: qty 2

## 2019-11-19 MED ORDER — VANCOMYCIN HCL 1500 MG/300ML IV SOLN: 1500.0000 mg | INTRAVENOUS | Status: DC

## 2019-11-19 MED ORDER — VANCOMYCIN HCL 2000 MG/400ML IV SOLN
2000.0000 mg | Freq: Once | INTRAVENOUS | Status: AC
  Administered 2019-11-19: 2000 mg via INTRAVENOUS
  Filled 2019-11-19: qty 400

## 2019-11-19 NOTE — Progress Notes (Signed)
PHARMACY - PHYSICIAN COMMUNICATION CRITICAL VALUE ALERT - BLOOD CULTURE IDENTIFICATION (BCID)  BLANCA CAPERTON is an 73 y.o. male who presented to [facility] on 11/18/2019  Assessment:  BCID + 1/2 GPC, no BCID panel ran at this time.  Name of physician (or Provider) Contacted: Dr. Tana Coast  Current antibiotics: None  Changes to prescribed antibiotics: Initiate vancomycin at this time.  No results found for this or any previous visit.  Lenis Noon, PharmD 11/19/2019  4:26 PM

## 2019-11-19 NOTE — Progress Notes (Addendum)
Patient ID: James Doyle, male   DOB: 09/28/1946, 73 y.o.   MRN: [id number]     Assessment & Plan: HD#2 - Partial small bowel obstruction, likely chronic  Patient has had 2-3 BM's overnight  Abdominal pain resolved  AXR with gaseous distension of colon, small bowel distension has resolved  Tolerating clear liquid diet - will advance to full liquid diet  Consider daily dose of Miralax to continue on discharge  History of partial colectomy with colostomy, history of colostomy takedown, robotic  Will follow. Follow up with Dr. Leighton Ruff at [place] office.        Armandina Gemma, MD       [facility]       Office: [phone]   Chief Complaint: Partial SBO  Subjective: Patient much improved this AM.  Having BM's.  Denies nausea or emesis.  Denies abdominal pain.  Objective: Vital signs in last 24 hours: Temp:  [97.6 F (36.4 C)-98.5 F (36.9 C)] 97.6 F (36.4 C) (12/20 0637) Pulse Rate:  [52-69] 54 (12/20 0637) Resp:  [15-19] 16 (12/20 0637) BP: (132-166)/(80-99) 134/87 (12/20 0637) SpO2:  [95 %-100 %] 99 % (12/20 0637) Weight:  [106.6 kg] 106.6 kg (12/20 0652) Last BM Date: 11/18/19  Intake/Output from previous day: 12/19 0701 - 12/20 0700 In: 2364.2 [P.O.:360; I.V.:1004.2; IV Piggyback:1000] Out: 0  Intake/Output this shift: No intake/output data recorded.  Physical Exam: HEENT - sclerae clear, mucous membranes moist Abdomen - soft, protuberant; non-tender; BS present Ext - no edema, non-tender Neuro - alert & oriented, no focal deficits  Lab Results:  Recent Labs    11/18/19 0649 11/19/19 0424  WBC 19.3* 9.1  HGB 16.2 14.0  HCT 50.1 43.8  PLT 177 149*   BMET Recent Labs    11/18/19 0649 11/19/19 0424  NA 141 141  K 3.6 3.4*  CL 104 108  CO2 26 23  GLUCOSE 132* 105*  BUN 17 13  CREATININE 1.21 1.17  CALCIUM 9.4 8.7*   PT/INR No results for input(s): LABPROT, INR in the last 72 hours. Comprehensive Metabolic Panel:     Component Value Date/Time   NA 141 11/19/2019 0424   NA 141 11/18/2019 0649   NA 142 11/17/2017 0843   NA 142 10/27/2017 0907   K 3.4 (L) 11/19/2019 0424   K 3.6 11/18/2019 0649   K 4.0 11/17/2017 0843   K 4.1 10/27/2017 0907   CL 108 11/19/2019 0424   CL 104 11/18/2019 0649   CO2 23 11/19/2019 0424   CO2 26 11/18/2019 0649   CO2 28 11/17/2017 0843   CO2 27 10/27/2017 0907   BUN 13 11/19/2019 0424   BUN 17 11/18/2019 0649   BUN 17.5 11/17/2017 0843   BUN 13.0 10/27/2017 0907   CREATININE 1.17 11/19/2019 0424   CREATININE 1.21 11/18/2019 0649   CREATININE 1.3 11/17/2017 0843   CREATININE 1.2 10/27/2017 0907   GLUCOSE 105 (H) 11/19/2019 0424   GLUCOSE 132 (H) 11/18/2019 0649   GLUCOSE 118 11/17/2017 0843   GLUCOSE 94 10/27/2017 0907   CALCIUM 8.7 (L) 11/19/2019 0424   CALCIUM 9.4 11/18/2019 0649   CALCIUM 9.5 11/17/2017 0843   CALCIUM 9.8 10/27/2017 0907   AST 17 11/19/2019 0424   AST 17 11/18/2019 0649   AST 23 11/17/2017 0843   AST 22 10/27/2017 0907   ALT 16 11/19/2019 0424   ALT 19 11/18/2019 0649   ALT 15 11/17/2017 0843   ALT 15 10/27/2017 0907  ALKPHOS 66 11/19/2019 0424   ALKPHOS 75 11/18/2019 0649   ALKPHOS 79 11/17/2017 0843   ALKPHOS 78 10/27/2017 0907   BILITOT 0.8 11/19/2019 0424   BILITOT 1.4 (H) 11/18/2019 0649   BILITOT 1.05 11/17/2017 0843   BILITOT 1.04 10/27/2017 0907   PROT 6.4 (L) 11/19/2019 0424   PROT 7.1 11/18/2019 0649   PROT 6.9 11/17/2017 0843   PROT 7.4 10/27/2017 0907   ALBUMIN 3.5 11/19/2019 0424   ALBUMIN 4.1 11/18/2019 0649   ALBUMIN 4.3 11/17/2017 0843   ALBUMIN 4.4 10/27/2017 0907    Studies/Results: Abd 1 View (KUB)  Result Date: 11/18/2019 CLINICAL DATA:  Abdominal distension and pain. EXAM: ABDOMEN - 1 VIEW COMPARISON:  None. FINDINGS: There is air in the colon at least to the level of the splenic flexure. There are prominent loops of small bowel in the left side of the abdomen with at least 1 dilated loop measuring  4.5 cm. No free air, portal venous gas, or pneumatosis seen on today's limited imaging. No other acute abnormalities. Contrast is seen in the bladder consistent with the CT scan from earlier today. IMPRESSION: Dilated loops of small bowel in the left abdomen may represent early or partial small bowel obstruction. The CT scan from earlier today also mentioned the possibility infectious enteritis which could result in a small bowel ileus. Recommend clinical correlation and close attention on follow-up. Electronically Signed   By: Dorise Bullion III M.D   On: 11/18/2019 16:49   CT Abdomen Pelvis W Contrast  Result Date: 11/18/2019 CLINICAL DATA:  73 year old male with diffuse abdominal pain, constipation EXAM: CT ABDOMEN AND PELVIS WITH CONTRAST TECHNIQUE: Multidetector CT imaging of the abdomen and pelvis was performed using the standard protocol following bolus administration of intravenous contrast. CONTRAST:  160mL OMNIPAQUE IOHEXOL 300 MG/ML  SOLN COMPARISON:  Most recent prior CT scan of the abdomen 07/17/2019; CT scan of the chest 07/20/2017 FINDINGS: Lower chest: Tiny subpleural nodule in the periphery of the right middle lobe remains unchanged dating back to August of 2018. Greater than 2 year stability is consistent with benignity. No further follow-up required. The lung bases are otherwise clear. Visualized cardiac structures are normal in size. No pericardial effusion. Unremarkable distal thoracic esophagus. Hepatobiliary: Normal hepatic contour and morphology. No discrete hepatic lesions. Normal appearance of the gallbladder. No intra or extrahepatic biliary ductal dilatation. Pancreas: Unremarkable. No pancreatic ductal dilatation or surrounding inflammatory changes. Spleen: The spleen itself is within normal limits. However, there is a trace amount of perisplenic fluid extending to the subdiaphragmatic recess. Adrenals/Urinary Tract: The adrenal glands are normal. There are numerous circumscribed  water attenuation simple cysts arising from the kidneys bilaterally. The largest right-sided renal cyst measures up to 7.2 cm. The largest left-sided renal cyst measures up to 8.8 cm. An intermediate density cyst exophytic from the lower pole of the left kidney measures 2.1 cm, slightly decreased compared to 2.4 cm in 2019. This likely represents a mildly complex or previously hemorrhagic cyst. No enhancing renal lesion identified. Punctate nonobstructing nephrolithiasis in the right interpolar collecting system. The ureters are unremarkable. The bladder is collapsed. Stomach/Bowel: Surgical changes of prior sigmoid colectomy with primary colo colonic anastomosis. No evidence of anastomotic stricture. No evidence of colonic wall thickening or inflammation. There are several loops of mildly dilated and fluid-filled small bowel in the left hemiabdomen with associated mesenteric edema and adjacent interloop fluid. No focal transition point evident. Vascular/Lymphatic: No suspicious lymphadenopathy. No significant atherosclerotic plaque, aneurysm or dissection.  Reproductive: Prostatomegaly. Other: No abdominal wall hernia. Musculoskeletal: No acute fracture or aggressive appearing lytic or blastic osseous lesion. Surgical changes of prior L4-L5 posterior decompression and interbody graft placement. IMPRESSION: 1. Multiple loops of mildly dilated and fluid-filled small bowel in the left hemidiaphragm with associated mild mesenteric edema, interloop fluid and a small amount of ascites extending upward into the perisplenic/subdiaphragmatic space. No transition point is evident. Differential considerations include infectious enteritis, partial small bowel obstruction, or intermittent small bowel obstruction. 2. Surgical changes of prior sigmoid colectomy. 3. Punctate nonobstructing nephrolithiasis in the interpolar right kidney. 4. Large bilateral renal cysts. Electronically Signed   By: Jacqulynn Cadet M.D.   On:  11/18/2019 08:51   DG Abd Portable 1V  Result Date: 11/19/2019 CLINICAL DATA:  Follow-up small bowel obstruction EXAM: PORTABLE ABDOMEN - 1 VIEW COMPARISON:  Yesterday FINDINGS: Unchanged gaseous distension of colon without visible dilated small bowel, in distinction to scanogram yesterday. No concerning mass effect or gas collection. Lung bases are clear. IMPRESSION: Normalizing bowel gas pattern. Small bowel distension on admission CT is improved. Electronically Signed   By: Monte Fantasia M.D.   On: 11/19/2019 08:04   US Abdomen Limited RUQ  Result Date: 11/18/2019 CLINICAL DATA:  Elevated bilirubin EXAM: ULTRASOUND ABDOMEN LIMITED RIGHT UPPER QUADRANT COMPARISON:  CT scan November 18, 2019 FINDINGS: Gallbladder: The gallbladder is distended. There is no wall thickening, pericholecystic fluid, or Murphy's sign. No stones or sludge. Common bile duct: Diameter: 2.9 mm Liver: No focal lesion identified. Within normal limits in parenchymal echogenicity. Portal vein is patent on color Doppler imaging with normal direction of blood flow towards the liver. Other: Right renal cyst IMPRESSION: 1. The gallbladder is distended but otherwise normal in appearance. No stones or sludge. 2. No biliary duct dilatation. 3. Right renal cysts. Electronically Signed   By: Dorise Bullion III M.D   On: 11/18/2019 12:22      Armandina Gemma 11/19/2019

## 2019-11-19 NOTE — Progress Notes (Signed)
Pharmacy Antibiotic Note  James Doyle is a 73 y.o. male admitted on 11/18/2019 with abdominal pain, distention, constipation.  Pharmacy has been consulted for vancomycin dosing.  Pt has PMH significant for colon cancer, pt admitted with partial SBO. BCID + 1/2 GPC, vancomycin initiated.  Today, 11/19/19  WBC WNL  SCr 1.17, CrCl 71 mL/min  Afebrile  Plan:  Vancomycin 2000 mg LD followed by 1500 mg IV q24h  Goal vancomycin AUC 400-550   SCr daily  Follow cultures for ability to de-escalate  Check vancomycin levels once at steady state if indicated  Height: 6\' 2"  (188 cm) Weight: 235 lb 0.2 oz (106.6 kg) IBW/kg (Calculated) : 82.2  Temp (24hrs), Avg:98.1 F (36.7 C), Min:97.6 F (36.4 C), Max:98.5 F (36.9 C)  Recent Labs  Lab 11/18/19 0649 11/19/19 0424  WBC 19.3* 9.1  CREATININE 1.21 1.17    Estimated Creatinine Clearance: 71 mL/min (by C-G formula based on SCr of 1.17 mg/dL).    No Known Allergies  Antimicrobials this admission: vancomycin 12/20 >>   Dose adjustments this admission:  Microbiology results: 12/19 BCx: 1/2 GPC, identification pending   Lenis Noon, PharmD 11/19/2019 4:08 PM

## 2019-11-19 NOTE — Progress Notes (Signed)
[facility]                                                                              Patient Demographics  James Doyle, is a 73 y.o. male, DOB - [birth year], ZOX:[id number]  Admit date - 11/18/2019   Admitting Physician Merlene Laughter, DO  Outpatient Primary MD for the patient is Clovis Riley, L.August Saucer, MD  Outpatient specialists:   LOS - 0  days   Medical records reviewed and are as summarized below:    Chief Complaint  Patient presents with  . Abdominal Pain       Brief summary   Patient is a 73 year old male with history of osteoarthritis, left-sided colon cancer status post colectomy and colostomy, colostomy reversal and takedown, nephrolithiasis, hypertension presented with abdominal pain and distention with inability to have a BM for at least 4 days prior to admission.  Patient had a telemedicine visit with his oncologist and was recommended MiraLAX.  Patient doubled his MiraLAX however still was unable to have a BM or unable to pass gas.  Also reported nausea but no actual vomiting. COVID-19 test negative Patient had CT abdomen pelvis done in ED which showed multiple loops of mildly dilated and fluid-filled small bowel in left hemidiaphragm with associated mild mesenteric edema, interloop fluid and small amount of situs extending upwards into the perisplenic and subdiaphragmatic space.  Likely infectious enteritis, partial SBO or intermittent SBO. Patient was admitted for further work-up.  Assessment & Plan    Principal Problem: Acute small bowel obstruction, partial (HCC) -Improving, patient was placed on n.p.o. status with IV fluid hydration, bowel regimen -Patient reports BM last night and earlier this morning -KUB this a.m. showed normalizing bowel gas pattern -Tolerated clear liquids, will advance diet to full liquids.  General surgery following -Recommended patient to increase mobility, up in the chair and ambulate  Active Problems:    Essential hypertension -BP currently stable, continue lisinopril    Leukocytosis -Likely due to stress demargination in the setting of acute SBO -WBC is 19.3 at the time of admission now normalized to 9.1  Hypokalemia -Replaced  Mild hyperbilirubinemia -Total bilirubin 1.4 at the time of admission with indirect 1.1, direct of 0.3 -Right upper quadrant ultrasound showed gallbladder distended but otherwise normal, no stones or sludge, no biliary duct dilatation. -Bilirubin normalized today  History of left-sided colon CA status post colon resection/colectomy and colostomy with subsequent takedown and reversal -Continue outpatient follow-up with Dr. Mosetta Putt  Osteoarthritis Currently stable, continue Tylenol as needed  Obesity BMI 30.17 -Recommended diet and weight control  Code Status   Full code DVT Prophylaxis:  Lovenox  Family Communication: Discussed all imaging results, lab results, explained to the patient    Disposition Plan: Diet advanced to full liquids.  Likely DC home in a.m. if tolerating solids  Time Spent in minutes   35 minutes Procedures:  None  Consultants:   General surgery  Antimicrobials:   Anti-infectives (From admission, onward)   None          Medications  Scheduled Meds: . brimonidine  1 drop Both Eyes BID  . enoxaparin (LOVENOX) injection  40 mg Subcutaneous Q24H  . lisinopril  10 mg Oral Daily  . polyethylene glycol  17 g Oral BID  . potassium chloride  40 mEq Oral Once  . senna-docusate  1 tablet Oral BID   Continuous Infusions: . sodium chloride 75 mL/hr at 11/19/19 0638   PRN Meds:.acetaminophen **OR** acetaminophen, bisacodyl, ondansetron **OR** ondansetron (ZOFRAN) IV      Subjective:   James Doyle was seen and examined today.  Feels a lot better today, had BM overnight.  No acute abdominal pain nausea or vomiting.  Clear liquids tolerated.  Patient denies dizziness, chest pain, shortness of breath, fevers or chills. No  acute events overnight.    Objective:   Vitals:   11/18/19 1559 11/18/19 2030 11/19/19 0637 11/19/19 0652  BP: (!) 154/88 (!) 157/92 134/87   Pulse: 61 63 (!) 54   Resp: 18 19 16    Temp: 98.2 F (36.8 C) 98.5 F (36.9 C) 97.6 F (36.4 C)   TempSrc: Oral Oral Oral   SpO2: 99% 100% 99%   Weight:    106.6 kg  Height:        Intake/Output Summary (Last 24 hours) at 11/19/2019 1610 Last data filed at 11/19/2019 9604 Gross per 24 hour  Intake 1364.24 ml  Output 0 ml  Net 1364.24 ml     Wt Readings from Last 3 Encounters:  11/19/19 106.6 kg  07/19/19 111.6 kg  11/10/18 110.9 kg    Exam  General: Alert and oriented x 3, NAD  Eyes:   HEENT:  Atraumatic, normocephalic, x  Cardiovascular: S1 S2 auscultated, RRR  Respiratory: Clear to auscultation bilaterally, no wheezing, rales or rhonchi  Gastrointestinal: Soft, nontender, nondistended, + bowel sounds  Ext: no pedal edema bilaterally  Neuro: No new deficits  Musculoskeletal: No digital cyanosis, clubbing  Skin: No rashes  Psych: Normal affect and demeanor, alert and oriented x3    Data Reviewed:  I have personally reviewed following labs and imaging studies  Micro Results Recent Results (from the past 240 hour(s))  SARS CORONAVIRUS 2 (TAT 6-24 HRS) Nasopharyngeal Nasopharyngeal Swab     Status: None   Collection Time: 11/18/19 10:42 AM   Specimen: Nasopharyngeal Swab  Result Value Ref Range Status   SARS Coronavirus 2 NEGATIVE NEGATIVE Final    Comment: (NOTE) SARS-CoV-2 target nucleic acids are NOT DETECTED. The SARS-CoV-2 RNA is generally detectable in upper and lower respiratory specimens during the acute phase of infection. Negative results do not preclude SARS-CoV-2 infection, do not rule out co-infections with other pathogens, and should not be used as the sole basis for treatment or other patient management decisions. Negative results must be combined with clinical observations, patient  history, and epidemiological information. The expected result is Negative. Fact Sheet for Patients: HairSlick.no Fact Sheet for Healthcare Providers: quierodirigir.com This test is not yet approved or cleared by the [place] FDA and  has been authorized for detection and/or diagnosis of SARS-CoV-2 by FDA under an Emergency Use Authorization (EUA). This EUA will remain  in effect (meaning this test can be used) for the duration of the COVID-19 declaration under Section 56 4(b)(1) of the Act, 21 U.S.C. section 360bbb-3(b)(1), unless the authorization is terminated or revoked sooner. Performed at [facility], [address], [place], [place] [address]     Radiology Reports Abd 1 View (KUB)  Result Date: 11/18/2019 CLINICAL DATA:  Abdominal distension and pain. EXAM: ABDOMEN - 1 VIEW COMPARISON:  None. FINDINGS: There is air  in the colon at least to the level of the splenic flexure. There are prominent loops of small bowel in the left side of the abdomen with at least 1 dilated loop measuring 4.5 cm. No free air, portal venous gas, or pneumatosis seen on today's limited imaging. No other acute abnormalities. Contrast is seen in the bladder consistent with the CT scan from earlier today. IMPRESSION: Dilated loops of small bowel in the left abdomen may represent early or partial small bowel obstruction. The CT scan from earlier today also mentioned the possibility infectious enteritis which could result in a small bowel ileus. Recommend clinical correlation and close attention on follow-up. Electronically Signed   By: Gerome Sam III M.D   On: 11/18/2019 16:49   CT Abdomen Pelvis W Contrast  Result Date: 11/18/2019 CLINICAL DATA:  73 year old male with diffuse abdominal pain, constipation EXAM: CT ABDOMEN AND PELVIS WITH CONTRAST TECHNIQUE: Multidetector CT imaging of the abdomen and pelvis was performed using the standard  protocol following bolus administration of intravenous contrast. CONTRAST:  OMNIPAQUE IOHEXOL 300 MG/ML  SOLN COMPARISON:  Most recent prior CT scan of the abdomen 07/17/2019; CT scan of the chest 07/20/2017 FINDINGS: Lower chest: Tiny subpleural nodule in the periphery of the right middle lobe remains unchanged dating back to August of 2018. Greater than 2 year stability is consistent with benignity. No further follow-up required. The lung bases are otherwise clear. Visualized cardiac structures are normal in size. No pericardial effusion. Unremarkable distal thoracic esophagus. Hepatobiliary: Normal hepatic contour and morphology. No discrete hepatic lesions. Normal appearance of the gallbladder. No intra or extrahepatic biliary ductal dilatation. Pancreas: Unremarkable. No pancreatic ductal dilatation or surrounding inflammatory changes. Spleen: The spleen itself is within normal limits. However, there is a trace amount of perisplenic fluid extending to the subdiaphragmatic recess. Adrenals/Urinary Tract: The adrenal glands are normal. There are numerous circumscribed water attenuation simple cysts arising from the kidneys bilaterally. The largest right-sided renal cyst measures up to 7.2 cm. The largest left-sided renal cyst measures up to 8.8 cm. An intermediate density cyst exophytic from the lower pole of the left kidney measures 2.1 cm, slightly decreased compared to 2.4 cm in 2019. This likely represents a mildly complex or previously hemorrhagic cyst. No enhancing renal lesion identified. Punctate nonobstructing nephrolithiasis in the right interpolar collecting system. The ureters are unremarkable. The bladder is collapsed. Stomach/Bowel: Surgical changes of prior sigmoid colectomy with primary colo colonic anastomosis. No evidence of anastomotic stricture. No evidence of colonic wall thickening or inflammation. There are several loops of mildly dilated and fluid-filled small bowel in the left  hemiabdomen with associated mesenteric edema and adjacent interloop fluid. No focal transition point evident. Vascular/Lymphatic: No suspicious lymphadenopathy. No significant atherosclerotic plaque, aneurysm or dissection. Reproductive: Prostatomegaly. Other: No abdominal wall hernia. Musculoskeletal: No acute fracture or aggressive appearing lytic or blastic osseous lesion. Surgical changes of prior L4-L5 posterior decompression and interbody graft placement. IMPRESSION: 1. Multiple loops of mildly dilated and fluid-filled small bowel in the left hemidiaphragm with associated mild mesenteric edema, interloop fluid and a small amount of ascites extending upward into the perisplenic/subdiaphragmatic space. No transition point is evident. Differential considerations include infectious enteritis, partial small bowel obstruction, or intermittent small bowel obstruction. 2. Surgical changes of prior sigmoid colectomy. 3. Punctate nonobstructing nephrolithiasis in the interpolar right kidney. 4. Large bilateral renal cysts. Electronically Signed   By: Malachy Moan M.D.   On: 11/18/2019 08:51   DG Abd Portable 1V  Result Date:  11/19/2019 CLINICAL DATA:  Follow-up small bowel obstruction EXAM: PORTABLE ABDOMEN - 1 VIEW COMPARISON:  Yesterday FINDINGS: Unchanged gaseous distension of colon without visible dilated small bowel, in distinction to scanogram yesterday. No concerning mass effect or gas collection. Lung bases are clear. IMPRESSION: Normalizing bowel gas pattern. Small bowel distension on admission CT is improved. Electronically Signed   By: Marnee Spring M.D.   On: 11/19/2019 08:04   US Abdomen Limited RUQ  Result Date: 11/18/2019 CLINICAL DATA:  Elevated bilirubin EXAM: ULTRASOUND ABDOMEN LIMITED RIGHT UPPER QUADRANT COMPARISON:  CT scan November 18, 2019 FINDINGS: Gallbladder: The gallbladder is distended. There is no wall thickening, pericholecystic fluid, or Murphy's sign. No stones or sludge.  Common bile duct: Diameter: 2.9 mm Liver: No focal lesion identified. Within normal limits in parenchymal echogenicity. Portal vein is patent on color Doppler imaging with normal direction of blood flow towards the liver. Other: Right renal cyst IMPRESSION: 1. The gallbladder is distended but otherwise normal in appearance. No stones or sludge. 2. No biliary duct dilatation. 3. Right renal cysts. Electronically Signed   By: Gerome Sam III M.D   On: 11/18/2019 12:22    Lab Data:  CBC: Recent Labs  Lab 11/18/19 0649 11/19/19 0424  WBC 19.3* 9.1  NEUTROABS 16.6*  --   HGB 16.2 14.0  HCT 50.1 43.8  MCV 89.5 90.7  PLT 177 149*   Basic Metabolic Panel: Recent Labs  Lab 11/18/19 0649 11/19/19 0424  NA 141 141  K 3.6 3.4*  CL 104 108  CO2 26 23  GLUCOSE 132* 105*  BUN 17 13  CREATININE 1.21 1.17  CALCIUM 9.4 8.7*  MG  --  2.1  PHOS  --  3.7   GFR: Estimated Creatinine Clearance: 71 mL/min (by C-G formula based on SCr of 1.17 mg/dL). Liver Function Tests: Recent Labs  Lab 11/18/19 0649 11/19/19 0424  AST 17 17  ALT 19 16  ALKPHOS 75 66  BILITOT 1.4* 0.8  PROT 7.1 6.4*  ALBUMIN 4.1 3.5   Recent Labs  Lab 11/18/19 0649  LIPASE 25   No results for input(s): AMMONIA in the last 168 hours. Coagulation Profile: No results for input(s): INR, PROTIME in the last 168 hours. Cardiac Enzymes: No results for input(s): CKTOTAL, CKMB, CKMBINDEX, TROPONINI in the last 168 hours. BNP (last 3 results) No results for input(s): PROBNP in the last 8760 hours. HbA1C: No results for input(s): HGBA1C in the last 72 hours. CBG: Recent Labs  Lab 11/19/19 0751  GLUCAP 101*   Lipid Profile: No results for input(s): CHOL, HDL, LDLCALC, TRIG, CHOLHDL, LDLDIRECT in the last 72 hours. Thyroid Function Tests: Recent Labs    11/18/19 1618  TSH 0.384   Anemia Panel: No results for input(s): VITAMINB12, FOLATE, FERRITIN, TIBC, IRON, RETICCTPCT in the last 72 hours. Urine  analysis:    Component Value Date/Time   COLORURINE AMBER (A) 11/18/2019 0823   APPEARANCEUR HAZY (A) 11/18/2019 0823   LABSPEC 1.026 11/18/2019 0823   PHURINE 5.0 11/18/2019 0823   GLUCOSEU NEGATIVE 11/18/2019 0823   HGBUR MODERATE (A) 11/18/2019 0823   BILIRUBINUR NEGATIVE 11/18/2019 0823   KETONESUR 5 (A) 11/18/2019 0823   PROTEINUR 100 (A) 11/18/2019 0823   NITRITE NEGATIVE 11/18/2019 0823   LEUKOCYTESUR NEGATIVE 11/18/2019 0823     James Doyle M.D. [facility] 11/19/2019, 9:59 AM   Call night coverage person covering after 7pm

## 2019-11-20 DIAGNOSIS — Z933 Colostomy status: Secondary | ICD-10-CM

## 2019-11-20 LAB — BASIC METABOLIC PANEL
Anion gap: 8 (ref 5–15)
BUN: 13 mg/dL (ref 8–23)
CO2: 23 mmol/L (ref 22–32)
Calcium: 8.9 mg/dL (ref 8.9–10.3)
Chloride: 109 mmol/L (ref 98–111)
Creatinine, Ser: 1.1 mg/dL (ref 0.61–1.24)
GFR calc Af Amer: >60 mL/min (ref >60)
GFR calc non Af Amer: >60 mL/min (ref >60)
Glucose, Bld: 94 mg/dL (ref 70–99)
Potassium: 3.7 mmol/L (ref 3.5–5.1)
Sodium: 140 mmol/L (ref 135–145)

## 2019-11-20 LAB — GLUCOSE, CAPILLARY: Glucose-Capillary: 102 mg/dL — ABNORMAL HIGH (ref 70–99)

## 2019-11-20 MED ORDER — LACTULOSE 10 GM/15ML PO SOLN: 10.0000 g | Freq: Every day | ORAL | 0 refills | Status: DC | PRN

## 2019-11-20 MED ORDER — POLYETHYLENE GLYCOL 3350 17 G PO PACK: 17.0000 g | PACK | Freq: Two times a day (BID) | ORAL | 0 refills | Status: DC

## 2019-11-20 MED ORDER — ONDANSETRON HCL 4 MG PO TABS: 4.0000 mg | ORAL_TABLET | Freq: Four times a day (QID) | ORAL | 0 refills | Status: DC | PRN

## 2019-11-20 NOTE — Progress Notes (Signed)
Discharge instructions given to patient. Patient had no questions. NT or writer will wheel patient out once daughter gets here to pick him up

## 2019-11-20 NOTE — Discharge Summary (Signed)
Physician Discharge Summary  James Doyle [id number] DOB: May 27, 1946 DOA: 11/18/2019  PCP: Alroy Dust, L.Marlou Sa, MD  Admit date: 11/18/2019 Discharge date: 11/20/2019 Consultations: Gen surgery  Admitted From: home Disposition: home  Discharge Diagnoses:  Principal Problem:   Small bowel obstruction, partial ([place]) Active Problems:   OA (osteoarthritis) of knee   Cancer of left colon ([place])   Colon obstruction s/p colectomy/colostomy 06/09/2017   Status post end colostomy takedown laparoscopically 02/09/2018   Abdominal distention   Essential hypertension   Hyperbilirubinemia   Leukocytosis   Hyperglycemia  Hospital Course Summary: Admit date:11/18/2019  73 year old male with history of osteoarthritis, left-sided colon cancer status post colectomy/colostomy, colostomy reversal and takedown, nephrolithiasis, hypertension presented with abdominal pain and distention with inability to have a BM for at least 4 days prior to admission. Patient had a telemedicine visit with his oncologist and was recommended MiraLAX. Patient doubled his MiraLAX however still was unable to have a BM or unable to pass gas. Also reported nausea but no actual vomiting. ED Course: Afebrile.COVID-19 test negative.Patient had CT abdomen pelvis done in ED which showed multiple loops of mildly dilated and fluid-filled small bowel in left hemidiaphragm with associated mild mesenteric edema, interloop fluid and small amount of situs extending upwards into the perisplenic and subdiaphragmatic space,likely infectious enteritis, partial SBO or intermittent SBO. Hospital course: Patient admitted to [facility] for medical management of acute partial SBO.GS consulted and have been guiding Mx.  Acute small bowel obstruction, partial (HCC)-Improving, patient was placed on n.p.o. status with IV fluid hydration, bowel regimen.Patient now having BMs and KUB showing normalizing bowel gas pattern.General surgery followed along and diet  advanced slowly.Recommended patient to increase mobility, up in the chair and ambulate. Tolerating liquid diet well, advanced to solids today by GS and cleared by GS for discharge home. Patient states he has diarrhea sometimes and sometimes constipated. Advised outpatient GI evaluation through PCP referral to r/o IBS. He had nl BM today , will discharge on Miralax as suggested by GS with instructions to hold for diarrhea. He will see Dr Marcello Moores with GS in 1 week.  Mild hyperbilirubinemia-likely related to cholestasis. Total bilirubin 1.4 at the time of admission with indirect 1.1, direct of 0.3. USG showed distended GB but no stones or sludge, no biliary duct dilatation.Bilirubin normalized on repeat labs.  History of left-sided colon CA status post colon resection/colectomy and colostomy with subsequent takedown and reversal-Continue outpatient follow-up with Dr. Burr Medico  Essential hypertension-BP currently stable, continue lisinopril  Leukocytosis-Likely due to stress demargination in the setting of acute SBO. WBC is 19.3 at the time of admission now normalized to 9.1  Hypokalemia-Replaced  Osteoarthritis-Currently stable, continue Tylenol as needed  Obesity,BMI 30.17-Recommended diet and weight control   Discharge Exam:  Vitals:   11/19/19 2147 11/20/19 0630  BP: (!) 144/73 (!) 142/82  Pulse: (!) 54 (!) 54  Resp: 18 18  Temp: 98.2 F (36.8 C) 98.1 F (36.7 C)  SpO2: 97% 96%   Vitals:   11/19/19 0652 11/19/19 1334 11/19/19 2147 11/20/19 0630  BP:  129/74 (!) 144/73 (!) 142/82  Pulse:  (!) 58 (!) 54 (!) 54  Resp:  18 18 18   Temp:  98.1 F (36.7 C) 98.2 F (36.8 C) 98.1 F (36.7 C)  TempSrc:  Oral Oral Oral  SpO2:   97% 96%  Weight: 106.6 kg   105.4 kg  Height:        General: Pt is alert, awake, not in acute distress Cardiovascular: RRR, S1/S2 +,  no rubs, no gallops Respiratory: CTA bilaterally, no wheezing, no rhonchi Abdominal: Soft, NT, ND, bowel sounds  + Extremities: no edema, no cyanosis  Discharge Condition:Stable CODE STATUS: Full  Diet recommendation: Heart healthy diet Recommendations for Outpatient Follow-up:  1. Follow up with PCP: 5 days 2. Follow up with consultants: GI referral through PCP, GS Dr Marcello Moores in 1 week 3. Please obtain follow up labs including:   Home Health services upon discharge: no Equipment/Devices upon discharge:no   Discharge Instructions:  Discharge Instructions    Call MD for:   Complete by: As directed    Worsening constipation or diarrhea   Call MD for:  extreme fatigue   Complete by: As directed    Call MD for:  persistant dizziness or light-headedness   Complete by: As directed    Call MD for:  persistant nausea and vomiting   Complete by: As directed    Call MD for:  temperature >100.4   Complete by: As directed    Diet - low sodium heart healthy   Complete by: As directed    Increase activity slowly   Complete by: As directed      Allergies as of 11/20/2019   No Known Allergies     Medication List    STOP taking these medications   diphenoxylate-atropine 2.5-0.025 MG tablet Commonly known as: LOMOTIL     TAKE these medications   brimonidine 0.2 % ophthalmic solution Commonly known as: ALPHAGAN Place 1 drop into both eyes 2 (two) times daily.   lactulose 10 GM/15ML solution Commonly known as: CHRONULAC Take 15 mLs (10 g total) by mouth daily as needed for mild constipation or moderate constipation.   lisinopril 10 MG tablet Commonly known as: ZESTRIL Take 10 mg by mouth daily.   ondansetron 4 MG tablet Commonly known as: ZOFRAN Take 1 tablet (4 mg total) by mouth every 6 (six) hours as needed for nausea.   polyethylene glycol 17 g packet Commonly known as: MIRALAX / GLYCOLAX Take 17 g by mouth 2 (two) times daily. Hold for diarrhea      Follow-up Information    Leighton Ruff, MD. Schedule an appointment as soon as possible for a visit in 4 week(s).    Specialty: General Surgery Contact information: [address]  [place] [phone]          No Known Allergies    The results of significant diagnostics from this hospitalization (including imaging, microbiology, ancillary and laboratory) are listed below for reference.    Labs: BNP (last 3 results) No results for input(s): BNP in the last 8760 hours. Basic Metabolic Panel: Recent Labs  Lab 11/18/19 0649 11/19/19 0424 11/20/19 0421  NA 141 141 140  K 3.6 3.4* 3.7  CL 104 108 109  CO2 26 23 23   GLUCOSE 132* 105* 94  BUN 17 13 13   CREATININE 1.21 1.17 1.10  CALCIUM 9.4 8.7* 8.9  MG  --  2.1  --   PHOS  --  3.7  --    Liver Function Tests: Recent Labs  Lab 11/18/19 0649 11/19/19 0424  AST 17 17  ALT 19 16  ALKPHOS 75 66  BILITOT 1.4* 0.8  PROT 7.1 6.4*  ALBUMIN 4.1 3.5   Recent Labs  Lab 11/18/19 0649  LIPASE 25   No results for input(s): AMMONIA in the last 168 hours. CBC: Recent Labs  Lab 11/18/19 0649 11/19/19 0424  WBC 19.3* 9.1  NEUTROABS 16.6*  --  HGB 16.2 14.0  HCT 50.1 43.8  MCV 89.5 90.7  PLT 177 149*   Cardiac Enzymes: No results for input(s): CKTOTAL, CKMB, CKMBINDEX, TROPONINI in the last 168 hours. BNP: Invalid input(s): POCBNP CBG: Recent Labs  Lab 11/19/19 0751 11/20/19 0714  GLUCAP 101* 102*   D-Dimer No results for input(s): DDIMER in the last 72 hours. Hgb A1c No results for input(s): HGBA1C in the last 72 hours. Lipid Profile No results for input(s): CHOL, HDL, LDLCALC, TRIG, CHOLHDL, LDLDIRECT in the last 72 hours. Thyroid function studies Recent Labs    11/18/19 1618  TSH 0.384   Anemia work up No results for input(s): VITAMINB12, FOLATE, FERRITIN, TIBC, IRON, RETICCTPCT in the last 72 hours. Urinalysis    Component Value Date/Time   COLORURINE AMBER (A) 11/18/2019 0823   APPEARANCEUR HAZY (A) 11/18/2019 0823   LABSPEC 1.026 11/18/2019 0823   PHURINE 5.0 11/18/2019 0823    GLUCOSEU NEGATIVE 11/18/2019 0823   HGBUR MODERATE (A) 11/18/2019 0823   BILIRUBINUR NEGATIVE 11/18/2019 0823   KETONESUR 5 (A) 11/18/2019 0823   PROTEINUR 100 (A) 11/18/2019 0823   NITRITE NEGATIVE 11/18/2019 0823   LEUKOCYTESUR NEGATIVE 11/18/2019 0823   Sepsis Labs Invalid input(s): PROCALCITONIN,  WBC,  LACTICIDVEN Microbiology Recent Results (from the past 240 hour(s))  SARS CORONAVIRUS 2 (TAT 6-24 HRS) Nasopharyngeal Nasopharyngeal Swab     Status: None   Collection Time: 11/18/19 10:42 AM   Specimen: Nasopharyngeal Swab  Result Value Ref Range Status   SARS Coronavirus 2 NEGATIVE NEGATIVE Final    Comment: (NOTE) SARS-CoV-2 target nucleic acids are NOT DETECTED. The SARS-CoV-2 RNA is generally detectable in upper and lower respiratory specimens during the acute phase of infection. Negative results do not preclude SARS-CoV-2 infection, do not rule out co-infections with other pathogens, and should not be used as the sole basis for treatment or other patient management decisions. Negative results must be combined with clinical observations, patient history, and epidemiological information. The expected result is Negative. Fact Sheet for Patients: SugarRoll.be Fact Sheet for Healthcare Providers: https://www.woods-mathews.com/ This test is not yet approved or cleared by the [place] FDA and  has been authorized for detection and/or diagnosis of SARS-CoV-2 by FDA under an Emergency Use Authorization (EUA). This EUA will remain  in effect (meaning this test can be used) for the duration of the COVID-19 declaration under Section 56 4(b)(1) of the Act, 21 U.S.C. section 360bbb-3(b)(1), unless the authorization is terminated or revoked sooner. Performed at [facility], [place] [address], [place], [place] [address]   Culture, blood (routine x 2)     Status: None (Preliminary result)   Collection Time: 11/18/19  4:18 PM    Specimen: Right Antecubital; Blood  Result Value Ref Range Status   Specimen Description   Final    RIGHT ANTECUBITAL Performed at [facility] [address], [place], [place] [address]    Special Requests   Final    BOTTLES DRAWN AEROBIC ONLY Blood Culture results may not be optimal due to an inadequate volume of blood received in culture bottles Performed at [facility] [address] [place], [place] [address]    Culture   Final    NO GROWTH 2 DAYS Performed at [facility] [address], [place], [place] [address]    Report Status PENDING  Incomplete  Culture, blood (routine x 2)     Status: Abnormal (Preliminary result)   Collection Time: 11/18/19  4:18 PM   Specimen: BLOOD RIGHT HAND  Result Value Ref Range Status   Specimen Description   Final    BLOOD RIGHT HAND Performed at [facility] [address] [place], [place] [address]    Special Requests   Final    BOTTLES DRAWN AEROBIC ONLY Blood Culture adequate volume Performed at [facility] [address], [place], [place] [address]    Culture  Setup Time   Final    GRAM POSITIVE COCCI AEROBIC BOTTLE ONLY CRITICAL RESULT CALLED TO, READ BACK BY AND VERIFIED WITH: M. [place], PHARMD ([facility]) AT 1510 ON 11/19/19 BY C. JESSUP, MT.    Culture (A)  Final    STAPHYLOCOCCUS SPECIES (COAGULASE NEGATIVE) THE SIGNIFICANCE OF ISOLATING THIS ORGANISM FROM A SINGLE SET OF BLOOD CULTURES WHEN MULTIPLE SETS ARE DRAWN IS UNCERTAIN. PLEASE NOTIFY THE MICROBIOLOGY DEPARTMENT WITHIN ONE WEEK IF SPECIATION AND SENSITIVITIES ARE REQUIRED. Performed at [facility], [place] [address] [place], [place] [address]    Report Status PENDING  Incomplete    Procedures/Studies: Abd 1 View (KUB)  Result Date: 11/18/2019 CLINICAL DATA:  Abdominal distension and pain. EXAM: ABDOMEN - 1 VIEW COMPARISON:  None. FINDINGS: There is air in the colon at least to  the level of the splenic flexure. There are prominent loops of small bowel in the left side of the abdomen with at least 1 dilated loop measuring 4.5 cm. No free air, portal venous gas, or pneumatosis seen on today's limited imaging. No other acute abnormalities. Contrast is seen in the bladder consistent with the CT scan from earlier today. IMPRESSION: Dilated loops of small bowel in the left abdomen may represent early or partial small bowel obstruction. The CT scan from earlier today also mentioned the possibility infectious enteritis which could result in a small bowel ileus. Recommend clinical correlation and close attention on follow-up. Electronically Signed   By: Dorise Bullion III M.D   On: 11/18/2019 16:49   CT Abdomen Pelvis W Contrast  Result Date: 11/18/2019 CLINICAL DATA:  73 year old male with diffuse abdominal pain, constipation EXAM: CT ABDOMEN AND PELVIS WITH CONTRAST TECHNIQUE: Multidetector CT imaging of the abdomen and pelvis was performed using the standard protocol following bolus administration of intravenous contrast. CONTRAST:  132mL OMNIPAQUE IOHEXOL 300 MG/ML  SOLN COMPARISON:  Most recent prior CT scan of the abdomen 07/17/2019; CT scan of the chest 07/20/2017 FINDINGS: Lower chest: Tiny subpleural nodule in the periphery of the right middle lobe remains unchanged dating back to August of 2018. Greater than 2 year stability is consistent with benignity. No further follow-up required. The lung bases are otherwise clear. Visualized cardiac structures are normal in size. No pericardial effusion. Unremarkable distal thoracic esophagus. Hepatobiliary: Normal hepatic contour and morphology. No discrete hepatic lesions. Normal appearance of the gallbladder. No intra or extrahepatic biliary ductal dilatation. Pancreas: Unremarkable. No pancreatic ductal dilatation or surrounding inflammatory changes. Spleen: The spleen itself is within normal limits. However, there is a trace amount of  perisplenic fluid extending to the subdiaphragmatic recess. Adrenals/Urinary Tract: The adrenal glands are normal. There are numerous circumscribed water attenuation simple cysts arising from the kidneys bilaterally. The largest right-sided renal cyst measures up to 7.2 cm. The largest left-sided renal cyst measures up to 8.8 cm. An intermediate density cyst exophytic from the lower pole of the left kidney measures 2.1 cm, slightly decreased compared to 2.4 cm in 2019. This likely represents a mildly complex or previously hemorrhagic cyst.  No enhancing renal lesion identified. Punctate nonobstructing nephrolithiasis in the right interpolar collecting system. The ureters are unremarkable. The bladder is collapsed. Stomach/Bowel: Surgical changes of prior sigmoid colectomy with primary colo colonic anastomosis. No evidence of anastomotic stricture. No evidence of colonic wall thickening or inflammation. There are several loops of mildly dilated and fluid-filled small bowel in the left hemiabdomen with associated mesenteric edema and adjacent interloop fluid. No focal transition point evident. Vascular/Lymphatic: No suspicious lymphadenopathy. No significant atherosclerotic plaque, aneurysm or dissection. Reproductive: Prostatomegaly. Other: No abdominal wall hernia. Musculoskeletal: No acute fracture or aggressive appearing lytic or blastic osseous lesion. Surgical changes of prior L4-L5 posterior decompression and interbody graft placement. IMPRESSION: 1. Multiple loops of mildly dilated and fluid-filled small bowel in the left hemidiaphragm with associated mild mesenteric edema, interloop fluid and a small amount of ascites extending upward into the perisplenic/subdiaphragmatic space. No transition point is evident. Differential considerations include infectious enteritis, partial small bowel obstruction, or intermittent small bowel obstruction. 2. Surgical changes of prior sigmoid colectomy. 3. Punctate  nonobstructing nephrolithiasis in the interpolar right kidney. 4. Large bilateral renal cysts. Electronically Signed   By: Jacqulynn Cadet M.D.   On: 11/18/2019 08:51   DG Abd Portable 1V  Result Date: 11/19/2019 CLINICAL DATA:  Follow-up small bowel obstruction EXAM: PORTABLE ABDOMEN - 1 VIEW COMPARISON:  Yesterday FINDINGS: Unchanged gaseous distension of colon without visible dilated small bowel, in distinction to scanogram yesterday. No concerning mass effect or gas collection. Lung bases are clear. IMPRESSION: Normalizing bowel gas pattern. Small bowel distension on admission CT is improved. Electronically Signed   By: Monte Fantasia M.D.   On: 11/19/2019 08:04   US Abdomen Limited RUQ  Result Date: 11/18/2019 CLINICAL DATA:  Elevated bilirubin EXAM: ULTRASOUND ABDOMEN LIMITED RIGHT UPPER QUADRANT COMPARISON:  CT scan November 18, 2019 FINDINGS: Gallbladder: The gallbladder is distended. There is no wall thickening, pericholecystic fluid, or Murphy's sign. No stones or sludge. Common bile duct: Diameter: 2.9 mm Liver: No focal lesion identified. Within normal limits in parenchymal echogenicity. Portal vein is patent on color Doppler imaging with normal direction of blood flow towards the liver. Other: Right renal cyst IMPRESSION: 1. The gallbladder is distended but otherwise normal in appearance. No stones or sludge. 2. No biliary duct dilatation. 3. Right renal cysts. Electronically Signed   By: Dorise Bullion III M.D   On: 11/18/2019 12:22     Time coordinating discharge: Over 30 minutes  SIGNED:   Guilford Shi, MD  [facility] 11/20/2019, 1:15 PM Pager : [phone]

## 2019-11-20 NOTE — Discharge Instructions (Signed)
Low-Fiber Eating Plan Fiber is found in fruits, vegetables, whole grains, and beans. Eating a diet low in fiber helps to reduce how often you have bowel movements and how much you produce during a bowel movement. A low-fiber eating plan may help your digestive system heal if:  You have certain conditions, such as Crohn's disease or diverticulitis.  You recently had radiation therapy on your pelvis or bowel.  You recently had intestinal surgery.  You have a new surgical opening in your abdomen (colostomy or ileostomy).  Your intestine is narrowed (stricture). Your health care provider will determine how long you need to stay on this diet. Your health care provider may recommend that you work with a diet and nutrition specialist (dietitian). What are tips for following this plan? General guidelines  Follow recommendations from your dietitian about how much fiber you should have each day.  Most people on this eating plan should try to eat less than 10 grams (g) of fiber each day. Your daily fiber goal is _________________ g.  Take vitamin and mineral supplements as told by your health care provider or dietitian. Chewable or liquid forms are best when on this eating plan. Reading food labels  Check food labels for the amount of dietary fiber.  Choose foods that have less than 2 grams of fiber in one serving. Cooking  Use white flour and other allowed grains for baking and cooking.  Cook meat using methods that keep it tender, such as braising or poaching.  Cook eggs until the yolk is completely solid.  Cook with healthy oils, such as olive oil or canola oil. Meal planning   Eat 5-6 small meals throughout the day instead of 3 large meals.  If you are lactose intolerant: ? Choose low-lactose dairy foods. ? Do not eat dairy foods, if told by your dietitian.  Limit fat and oils to less than 8 teaspoons a day.  Eat small portions of desserts. What foods are allowed? The items  listed below may not be a complete list. Talk with your dietitian about what dietary choices are best for you. Grains All bread and crackers made with white flour. Waffles, pancakes, and French toast. Bagels. Pretzels. Melba toast, zwieback, and matzoh. Cooked and dried cereals that do not contain whole grains, added fiber, seeds, or dried fruit. Cornmeal. Farina. Hot and cold cereals made with refined corn, wheat, rice, or oats. Plain pasta and noodles. White rice. Vegetables Well-cooked or canned vegetables without skin, seeds, or stems. Cooked potatoes without skins. Vegetable juice. Fruits Soft-cooked or canned fruits without skin and seeds. Peeled ripe banana. Applesauce. Fruit juice without pulp. Meats and other protein foods Ground meat. Tender cuts of meat or poultry. Eggs. Fish, seafood, and shellfish. Smooth nut butters. Tofu. Dairy All milk products and drinks. Lactose-free milks, including rice, soy, and almond milks. Yogurt without fruit, nuts, chocolate, or granola mix-ins. Sour cream. Cottage cheese. Cheese. Beverages Decaf coffee. Fruit and vegetable juices or smoothies (in small amounts, with no pulp or skins, and with fruits from allowed list). Sports drinks. Herbal tea. Fats and oils Olive oil, canola oil, sunflower oil, flaxseed oil, and grapeseed oil. Mayonnaise. Cream cheese. Margarine. Butter. Sweets and desserts Plain cakes and cookies. Cream pies and pies made with allowed fruits. Pudding. Custard. Fruit gelatin. Sherbet. Popsicles. Ice cream without nuts. Plain hard candy. Honey. Jelly. Molasses. Syrups, including chocolate syrup. Chocolate. Marshmallows. Gumdrops. Seasoning and other foods Bouillon. Broth. Cream soups made from allowed foods. Strained soup. Casseroles made   with allowed foods. Ketchup. Mild mustard. Mild salad dressings. Plain gravies. Vinegar. Spices in moderation. Salt. Sugar. What foods are not allowed? The items listed below may not be a complete  list. Talk with your dietitian about what dietary choices are best for you. Grains Whole wheat and whole grain breads and crackers. Multigrain breads and crackers. Rye bread. Whole grain or multigrain cereals. Cereals with nuts, raisins, or coconut. Bran. Coarse wheat cereals. Granola. High-fiber cereals. Cornmeal or corn bread. Whole grain pasta. Wild or brown rice. Quinoa. Popcorn. Buckwheat. Wheat germ. Vegetables Potato skins. Raw or undercooked vegetables. All beans and bean sprouts. Cooked greens. Corn. Peas. Cabbage. Beets. Broccoli. Brussels sprouts. Cauliflower. Mushrooms. Onions. Peppers. Parsnips. Okra. Sauerkraut. Fruit Raw or dried fruit. Berries. Fruit juice with pulp. Prune juice. Meats and other protein foods Tough, fibrous meats with gristle. Fatty meat. Poultry with skin. Fried meat, poultry, or fish. Deli or lunch meats. Sausage, bacon, and hot dogs. Nuts and chunky nut butter. Dried peas, beans, and lentils. Dairy Yogurt with fruit, nuts, chocolate, or granola mix-ins. Beverages Caffeinated coffee and teas. Fats and oils Avocado. Coconut. Sweets and desserts Desserts, cookies, or candies that contain nuts or coconut. Dried fruit. Jams and preserves with seeds. Marmalade. Any dessert made with fruits or grains that are not allowed. Seasoning and other foods Corn tortilla chips. Soups made with vegetables or grains that are not allowed. Relish. Horseradish. Pickles. Olives. Summary  Most people on a low-fiber eating plan should eat less than 10 grams of fiber a day. Follow recommendations from your dietitian about how much fiber you should have each day.  Always check food labels to see the dietary fiber content of packaged foods. In general, a low-fiber food will have fewer than 2 grams of fiber per serving.  In general, try to avoid whole grains, raw fruits and vegetables, dried fruit, tough cuts of meat, nuts, and seeds.  Take a vitamin and mineral supplement as told  by your health care provider or dietitian. This information is not intended to replace advice given to you by your health care provider. Make sure you discuss any questions you have with your health care provider. Document Released: 05/08/2002 Document Revised: 03/10/2019 Document Reviewed: 01/19/2017 Elsevier Patient Education  2020 [employer]  

## 2019-11-20 NOTE — Progress Notes (Signed)
Patient ID: James Doyle, male   DOB: 02/24/1946, 73 y.o.   MRN: [id number]       Subjective: Patient ready to go home.  Tolerating full liquids with no issues.  Having BMs and no nausea.  ROS: See above, otherwise other systems negative  Objective: Vital signs in last 24 hours: Temp:  [98.1 F (36.7 C)-98.2 F (36.8 C)] 98.1 F (36.7 C) (12/21 0630) Pulse Rate:  [54-58] 54 (12/21 0630) Resp:  [18] 18 (12/21 0630) BP: (129-144)/(73-82) 142/82 (12/21 0630) SpO2:  [96 %-97 %] 96 % (12/21 0630) Weight:  [105.4 kg] 105.4 kg (12/21 0630) Last BM Date: 11/19/19  Intake/Output from previous day: 12/20 0701 - 12/21 0700 In: 2391.4 [P.O.:1440; I.V.:551.4; IV Piggyback:400] Out: 1800 [Urine:1800] Intake/Output this shift: No intake/output data recorded.  PE: Abd: soft, NT, Nd, +BS  Lab Results:  Recent Labs    11/18/19 0649 11/19/19 0424  WBC 19.3* 9.1  HGB 16.2 14.0  HCT 50.1 43.8  PLT 177 149*   BMET Recent Labs    11/19/19 0424 11/20/19 0421  NA 141 140  K 3.4* 3.7  CL 108 109  CO2 23 23  GLUCOSE 105* 94  BUN 13 13  CREATININE 1.17 1.10  CALCIUM 8.7* 8.9   PT/INR No results for input(s): LABPROT, INR in the last 72 hours. CMP     Component Value Date/Time   NA 140 11/20/2019 0421   NA 142 11/17/2017 0843   K 3.7 11/20/2019 0421   K 4.0 11/17/2017 0843   CL 109 11/20/2019 0421   CO2 23 11/20/2019 0421   CO2 28 11/17/2017 0843   GLUCOSE 94 11/20/2019 0421   GLUCOSE 118 11/17/2017 0843   BUN 13 11/20/2019 0421   BUN 17.5 11/17/2017 0843   CREATININE 1.10 11/20/2019 0421   CREATININE 1.3 11/17/2017 0843   CALCIUM 8.9 11/20/2019 0421   CALCIUM 9.5 11/17/2017 0843   PROT 6.4 (L) 11/19/2019 0424   PROT 6.9 11/17/2017 0843   ALBUMIN 3.5 11/19/2019 0424   ALBUMIN 4.3 11/17/2017 0843   AST 17 11/19/2019 0424   AST 23 11/17/2017 0843   ALT 16 11/19/2019 0424   ALT 15 11/17/2017 0843   ALKPHOS 66 11/19/2019 0424   ALKPHOS 79 11/17/2017 0843   BILITOT  0.8 11/19/2019 0424   BILITOT 1.05 11/17/2017 0843   GFRNONAA >60 11/20/2019 0421   GFRAA >60 11/20/2019 0421   Lipase     Component Value Date/Time   LIPASE 25 11/18/2019 0649       Studies/Results: Abd 1 View (KUB)  Result Date: 11/18/2019 CLINICAL DATA:  Abdominal distension and pain. EXAM: ABDOMEN - 1 VIEW COMPARISON:  None. FINDINGS: There is air in the colon at least to the level of the splenic flexure. There are prominent loops of small bowel in the left side of the abdomen with at least 1 dilated loop measuring 4.5 cm. No free air, portal venous gas, or pneumatosis seen on today's limited imaging. No other acute abnormalities. Contrast is seen in the bladder consistent with the CT scan from earlier today. IMPRESSION: Dilated loops of small bowel in the left abdomen may represent early or partial small bowel obstruction. The CT scan from earlier today also mentioned the possibility infectious enteritis which could result in a small bowel ileus. Recommend clinical correlation and close attention on follow-up. Electronically Signed   By: Dorise Bullion III M.D   On: 11/18/2019 16:49   DG Abd Portable 1V  Result  Date: 11/19/2019 CLINICAL DATA:  Follow-up small bowel obstruction EXAM: PORTABLE ABDOMEN - 1 VIEW COMPARISON:  Yesterday FINDINGS: Unchanged gaseous distension of colon without visible dilated small bowel, in distinction to scanogram yesterday. No concerning mass effect or gas collection. Lung bases are clear. IMPRESSION: Normalizing bowel gas pattern. Small bowel distension on admission CT is improved. Electronically Signed   By: Monte Fantasia M.D.   On: 11/19/2019 08:04   US Abdomen Limited RUQ  Result Date: 11/18/2019 CLINICAL DATA:  Elevated bilirubin EXAM: ULTRASOUND ABDOMEN LIMITED RIGHT UPPER QUADRANT COMPARISON:  CT scan November 18, 2019 FINDINGS: Gallbladder: The gallbladder is distended. There is no wall thickening, pericholecystic fluid, or Murphy's sign. No  stones or sludge. Common bile duct: Diameter: 2.9 mm Liver: No focal lesion identified. Within normal limits in parenchymal echogenicity. Portal vein is patent on color Doppler imaging with normal direction of blood flow towards the liver. Other: Right renal cyst IMPRESSION: 1. The gallbladder is distended but otherwise normal in appearance. No stones or sludge. 2. No biliary duct dilatation. 3. Right renal cysts. Electronically Signed   By: Dorise Bullion III M.D   On: 11/18/2019 12:22    Anti-infectives: Anti-infectives (From admission, onward)   Start     Dose/Rate Route Frequency Ordered Stop   11/20/19 1800  vancomycin (VANCOREADY) IVPB 1500 mg/300 mL     1,500 mg 150 mL/hr over 120 Minutes Intravenous Every 24 hours 11/19/19 1607     11/19/19 1600  vancomycin (VANCOREADY) IVPB 2000 mg/400 mL     2,000 mg 200 mL/hr over 120 Minutes Intravenous  Once 11/19/19 1519 11/19/19 2011       Assessment/Plan PSBO, likely chronic -patient having multiple BMs and tolerating full liquids -diet adv to soft, but is stable for DC home. -will have him follow up with Dr. Marcello Moores given intermittent symptoms of obstructions.  FEN - soft diet VTE - Lovenox ID - vanc, per medicine   LOS: 1 day    Henreitta Cea , [facility] 11/20/2019, 9:52 AM Please see Amion for pager number during day hours 7:00am-4:30pm

## 2019-11-21 LAB — CULTURE, BLOOD (ROUTINE X 2): Special Requests: ADEQUATE

## 2019-11-23 LAB — CULTURE, BLOOD (ROUTINE X 2): Culture: NO GROWTH

## 2019-12-19 ENCOUNTER — Other Ambulatory Visit: Payer: Self-pay | Admitting: General Surgery

## 2019-12-19 DIAGNOSIS — K5651 Intestinal adhesions [bands], with partial obstruction: Secondary | ICD-10-CM

## 2019-12-19 DIAGNOSIS — R112 Nausea with vomiting, unspecified: Secondary | ICD-10-CM

## 2019-12-20 ENCOUNTER — Other Ambulatory Visit (HOSPITAL_COMMUNITY): Payer: Self-pay | Admitting: General Surgery

## 2019-12-20 DIAGNOSIS — R112 Nausea with vomiting, unspecified: Secondary | ICD-10-CM

## 2019-12-20 DIAGNOSIS — K5651 Intestinal adhesions [bands], with partial obstruction: Secondary | ICD-10-CM

## 2019-12-21 ENCOUNTER — Ambulatory Visit (HOSPITAL_COMMUNITY)
Admission: RE | Admit: 2019-12-21 | Discharge: 2019-12-21 | Disposition: A | Discharge status: Home / Self Care | Payer: Medicare Other | Source: Ambulatory Visit | Attending: General Surgery | Admitting: General Surgery

## 2019-12-21 ENCOUNTER — Other Ambulatory Visit: Payer: Self-pay

## 2019-12-21 DIAGNOSIS — K5651 Intestinal adhesions [bands], with partial obstruction: Secondary | ICD-10-CM | POA: Insufficient documentation

## 2019-12-21 DIAGNOSIS — R112 Nausea with vomiting, unspecified: Secondary | ICD-10-CM | POA: Insufficient documentation

## 2019-12-21 MED ORDER — IOHEXOL 300 MG/ML  SOLN
100.0000 mL | Freq: Once | INTRAMUSCULAR | Status: AC | PRN
  Administered 2019-12-21: 100 mL via INTRAVENOUS

## 2019-12-22 ENCOUNTER — Ambulatory Visit: Payer: Medicare Other | Attending: Internal Medicine

## 2019-12-22 ENCOUNTER — Inpatient Hospital Stay (HOSPITAL_COMMUNITY)
Admission: AD | Admit: 2019-12-22 | Discharge: 2019-12-28 | DRG: 348 | Disposition: A | Discharge status: Home / Self Care | Payer: Medicare Other | Source: Ambulatory Visit | Attending: Physician Assistant | Admitting: Physician Assistant

## 2019-12-22 ENCOUNTER — Ambulatory Visit: Payer: Self-pay | Admitting: General Surgery

## 2019-12-22 ENCOUNTER — Inpatient Hospital Stay (HOSPITAL_COMMUNITY): Payer: Medicare Other

## 2019-12-22 ENCOUNTER — Encounter (HOSPITAL_COMMUNITY): Payer: Self-pay | Admitting: General Surgery

## 2019-12-22 DIAGNOSIS — I10 Essential (primary) hypertension: Secondary | ICD-10-CM | POA: Diagnosis present

## 2019-12-22 DIAGNOSIS — K56691 Other complete intestinal obstruction: Principal | ICD-10-CM | POA: Diagnosis present

## 2019-12-22 DIAGNOSIS — Z800 Family history of malignant neoplasm of digestive organs: Secondary | ICD-10-CM | POA: Diagnosis not present

## 2019-12-22 DIAGNOSIS — Z8249 Family history of ischemic heart disease and other diseases of the circulatory system: Secondary | ICD-10-CM | POA: Diagnosis not present

## 2019-12-22 DIAGNOSIS — Z809 Family history of malignant neoplasm, unspecified: Secondary | ICD-10-CM

## 2019-12-22 DIAGNOSIS — K660 Peritoneal adhesions (postprocedural) (postinfection): Secondary | ICD-10-CM | POA: Diagnosis present

## 2019-12-22 DIAGNOSIS — K56609 Unspecified intestinal obstruction, unspecified as to partial versus complete obstruction: Secondary | ICD-10-CM | POA: Diagnosis present

## 2019-12-22 DIAGNOSIS — Z823 Family history of stroke: Secondary | ICD-10-CM | POA: Diagnosis not present

## 2019-12-22 DIAGNOSIS — C189 Malignant neoplasm of colon, unspecified: Secondary | ICD-10-CM

## 2019-12-22 DIAGNOSIS — Z20822 Contact with and (suspected) exposure to covid-19: Secondary | ICD-10-CM | POA: Diagnosis present

## 2019-12-22 DIAGNOSIS — Z87442 Personal history of urinary calculi: Secondary | ICD-10-CM

## 2019-12-22 DIAGNOSIS — C786 Secondary malignant neoplasm of retroperitoneum and peritoneum: Secondary | ICD-10-CM | POA: Diagnosis present

## 2019-12-22 DIAGNOSIS — Z9221 Personal history of antineoplastic chemotherapy: Secondary | ICD-10-CM

## 2019-12-22 DIAGNOSIS — Z85038 Personal history of other malignant neoplasm of large intestine: Secondary | ICD-10-CM

## 2019-12-22 DIAGNOSIS — C186 Malignant neoplasm of descending colon: Secondary | ICD-10-CM | POA: Diagnosis present

## 2019-12-22 DIAGNOSIS — Z9049 Acquired absence of other specified parts of digestive tract: Secondary | ICD-10-CM

## 2019-12-22 DIAGNOSIS — D62 Acute posthemorrhagic anemia: Secondary | ICD-10-CM | POA: Diagnosis not present

## 2019-12-22 DIAGNOSIS — K567 Ileus, unspecified: Secondary | ICD-10-CM | POA: Diagnosis not present

## 2019-12-22 DIAGNOSIS — E669 Obesity, unspecified: Secondary | ICD-10-CM | POA: Diagnosis present

## 2019-12-22 DIAGNOSIS — Z4659 Encounter for fitting and adjustment of other gastrointestinal appliance and device: Secondary | ICD-10-CM

## 2019-12-22 DIAGNOSIS — Z933 Colostomy status: Secondary | ICD-10-CM

## 2019-12-22 DIAGNOSIS — Z79899 Other long term (current) drug therapy: Secondary | ICD-10-CM

## 2019-12-22 DIAGNOSIS — Z6830 Body mass index (BMI) 30.0-30.9, adult: Secondary | ICD-10-CM | POA: Diagnosis not present

## 2019-12-22 DIAGNOSIS — C772 Secondary and unspecified malignant neoplasm of intra-abdominal lymph nodes: Secondary | ICD-10-CM | POA: Diagnosis not present

## 2019-12-22 DIAGNOSIS — Z23 Encounter for immunization: Secondary | ICD-10-CM | POA: Insufficient documentation

## 2019-12-22 LAB — COMPREHENSIVE METABOLIC PANEL
ALT: 34 U/L (ref 0–44)
AST: 29 U/L (ref 15–41)
Albumin: 4.1 g/dL (ref 3.5–5.0)
Alkaline Phosphatase: 72 U/L (ref 38–126)
Anion gap: 9 (ref 5–15)
BUN: 14 mg/dL (ref 8–23)
CO2: 27 mmol/L (ref 22–32)
Calcium: 9.3 mg/dL (ref 8.9–10.3)
Chloride: 103 mmol/L (ref 98–111)
Creatinine, Ser: 1.12 mg/dL (ref 0.61–1.24)
GFR calc Af Amer: >60 mL/min (ref >60)
GFR calc non Af Amer: >60 mL/min (ref >60)
Glucose, Bld: 108 mg/dL — ABNORMAL HIGH (ref 70–99)
Potassium: 3.6 mmol/L (ref 3.5–5.1)
Sodium: 139 mmol/L (ref 135–145)
Total Bilirubin: 0.9 mg/dL (ref 0.3–1.2)
Total Protein: 7.1 g/dL (ref 6.5–8.1)

## 2019-12-22 LAB — CBC
HCT: 47 % (ref 39.0–52.0)
Hemoglobin: 15 g/dL (ref 13.0–17.0)
MCH: 28.7 pg (ref 26.0–34.0)
MCHC: 31.9 g/dL (ref 30.0–36.0)
MCV: 89.9 fL (ref 80.0–100.0)
Platelets: 184 10*3/uL (ref 150–400)
RBC: 5.23 MIL/uL (ref 4.22–5.81)
RDW: 12.4 % (ref 11.5–15.5)
WBC: 10.5 10*3/uL (ref 4.0–10.5)
nRBC: 0 % (ref 0.0–0.2)

## 2019-12-22 MED ORDER — KCL IN DEXTROSE-NACL 20-5-0.45 MEQ/L-%-% IV SOLN
INTRAVENOUS | Status: DC
  Filled 2019-12-22 (×5): qty 1000

## 2019-12-22 MED ORDER — ONDANSETRON 4 MG PO TBDP: 4.0000 mg | ORAL_TABLET | Freq: Four times a day (QID) | ORAL | Status: DC | PRN

## 2019-12-22 MED ORDER — DIPHENHYDRAMINE HCL 50 MG/ML IJ SOLN: 12.5000 mg | Freq: Four times a day (QID) | INTRAMUSCULAR | Status: DC | PRN

## 2019-12-22 MED ORDER — ONDANSETRON HCL 4 MG/2ML IJ SOLN: 4.0000 mg | Freq: Four times a day (QID) | INTRAMUSCULAR | Status: DC | PRN

## 2019-12-22 MED ORDER — ENOXAPARIN SODIUM 40 MG/0.4ML ~~LOC~~ SOLN
40.0000 mg | SUBCUTANEOUS | Status: DC
  Administered 2019-12-22 – 2019-12-23 (×2): 40 mg via SUBCUTANEOUS
  Filled 2019-12-22 (×2): qty 0.4

## 2019-12-22 MED ORDER — DORZOLAMIDE HCL-TIMOLOL MAL 2-0.5 % OP SOLN
1.0000 [drp] | Freq: Two times a day (BID) | OPHTHALMIC | Status: DC
  Administered 2019-12-22 – 2019-12-28 (×11): 1 [drp] via OPHTHALMIC
  Filled 2019-12-22: qty 10

## 2019-12-22 MED ORDER — DIPHENHYDRAMINE HCL 12.5 MG/5ML PO ELIX: 12.5000 mg | ORAL_SOLUTION | Freq: Four times a day (QID) | ORAL | Status: DC | PRN

## 2019-12-22 MED ORDER — MORPHINE SULFATE (PF) 2 MG/ML IV SOLN: 2.0000 mg | INTRAVENOUS | Status: DC | PRN

## 2019-12-22 NOTE — Progress Notes (Signed)
.    Covid-19 Vaccination Clinic  Name:  SAVIR ERDMANN    MRN: [id number] DOB: 06/03/1946  12/22/2019  Mr. Sollers was observed post Covid-19 immunization for 15 minutes without incidence. He was provided with Vaccine Information Sheet and instruction to access the V-Safe system.   Mr. Reisdorf was instructed to call 911 with any severe reactions post vaccine: Marland Kitchen Difficulty breathing  . Swelling of your face and throat  . A fast heartbeat  . A bad rash all over your body  . Dizziness and weakness    Immunizations Administered    Name Date Dose VIS Date Route   Pfizer COVID-19 Vaccine 12/22/2019 11:17 AM 0.3 mL 11/10/2019 Intramuscular   Manufacturer: [place]   Lot: [id number]   [place]: [id number]

## 2019-12-22 NOTE — Progress Notes (Addendum)
Patient arrived to floor via WC to room 1507. Patient is alert and cooperative. Patient will be a direct admit under the care of [facility]/Dr. Leighton Ruff.

## 2019-12-22 NOTE — H&P (Signed)
James Doyle is an 74 y.o. male.   Chief Complaint: nausea, vomiting and abd pain HPI: 74 y.o. M who was admitted several weeks ago with SBO symptoms that resolved quickly.  Since that time, he has been having trouble with continued episodes nausea and vomiting.  Repeat CT shows continued SBO.  I have recommended admission and possible surgery.    Past Medical History:  Diagnosis Date  . Aortic atherosclerosis ([place]) 12/27/2017   Noted on CT abd/pelvis  . Arthritis   . Cancer of left colon ([place] Doyle) 06/08/2017  . Cellulitis of left hand   . Chronic back pain   . Chronic pain of right knee   . Chronic right hip pain   . 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 DDD (degenerative disc disease), lumbar   . H/O vocal cord paralysis    left   . History of hypokalemia   . History of thumb fracture    Left  . Left nephrolithiasis 12/27/2017   Noted on CT abd/pelvis    Past Surgical History:  Procedure Laterality Date  . BACK SURGERY     times 2  . COLON RESECTION    . COLONOSCOPY    . COLOSTOMY    . COLOSTOMY TAKEDOWN N/A 02/09/2018   Procedure: LAPAROSCOPIC COLOSTOMY REVERSAL ERAS PATHWAY;  Surgeon: Leighton Ruff, MD;  Location: [facility];  Service: General;  Laterality: N/A;  . INNER EAR SURGERY     bilat  . LAPAROSCOPY N/A 06/09/2017   Procedure: LAPAROSCOPY DIAGNOSTIC, OPEN  LEFT COLECTOMY;  Surgeon: Leighton Ruff, MD;  Location: [facility];  Service: General;  Laterality: N/A;  . PROSTATE BIOPSY    . TONSILLECTOMY    . TOTAL KNEE ARTHROPLASTY Right 07/27/2016   Procedure: RIGHT TOTAL KNEE ARTHROPLASTY;  Surgeon: Gaynelle Arabian, MD;  Location: [facility];  Service: Orthopedics;  Laterality: Right;    Family History  Problem Relation Age of Onset  . Heart failure Mother   . Stroke Father   . Cancer Maternal Uncle        colon cancer   Social History:  reports that he has never smoked. He has never used smokeless tobacco. He reports that he does not drink alcohol  or use drugs.  Allergies: No Known Allergies  Medications Prior to Admission  Medication Sig Dispense Refill  . brimonidine (ALPHAGAN) 0.2 % ophthalmic solution Place 1 drop into both eyes 2 (two) times daily.     . dorzolamide-timolol (COSOPT) 22.3-6.8 MG/ML ophthalmic solution Place 1 drop into both eyes daily.    Marland Kitchen lactulose (CHRONULAC) 10 GM/15ML solution Take 15 mLs (10 g total) by mouth daily as needed for mild constipation or moderate constipation. 236 mL 0  . ondansetron (ZOFRAN) 4 MG tablet Take 1 tablet (4 mg total) by mouth every 6 (six) hours as needed for nausea. 20 tablet 0  . polyethylene glycol (MIRALAX / GLYCOLAX) 17 g packet Take 17 g by mouth 2 (two) times daily. Hold for diarrhea 14 each 0    Results for orders placed or performed during the hospital encounter of 12/22/19 (from the past 48 hour(s))  CBC     Status: None   Collection Time: 12/22/19  3:25 PM  Result Value Ref Range   WBC 10.5 4.0 - 10.5 K/uL   RBC 5.23 4.22 - 5.81 MIL/uL   Hemoglobin 15.0 13.0 - 17.0 g/dL   HCT 47.0 39.0 - 52.0 %   MCV 89.9 80.0 - 100.0 fL  MCH 28.7 26.0 - 34.0 pg   MCHC 31.9 30.0 - 36.0 g/dL   RDW 12.4 11.5 - 15.5 %   Platelets 184 150 - 400 K/uL   nRBC 0.0 0.0 - 0.2 %    Comment: Performed at [facility], [place] [address], [place], [place] [address]  Comprehensive metabolic panel     Status: Abnormal   Collection Time: 12/22/19  3:25 PM  Result Value Ref Range   Sodium 139 135 - 145 mmol/L   Potassium 3.6 3.5 - 5.1 mmol/L   Chloride 103 98 - 111 mmol/L   CO2 27 22 - 32 mmol/L   Glucose, Bld 108 (H) 70 - 99 mg/dL   BUN 14 8 - 23 mg/dL   Creatinine, Ser 1.12 0.61 - 1.24 mg/dL   Calcium 9.3 8.9 - 10.3 mg/dL   Total Protein 7.1 6.5 - 8.1 g/dL   Albumin 4.1 3.5 - 5.0 g/dL   AST 29 15 - 41 U/L   ALT 34 0 - 44 U/L   Alkaline Phosphatase 72 38 - 126 U/L   Total Bilirubin 0.9 0.3 - 1.2 mg/dL   GFR calc non Af Amer >60 >60 mL/min   GFR calc Af Amer >60 >60  mL/min   Anion gap 9 5 - 15    Comment: Performed at [facility], [place] [address] [place], [place] [address]   CT ABDOMEN PELVIS W CONTRAST  Result Date: 12/21/2019 CLINICAL DATA:  74 year old male with history of colon cancer status post colectomy and colostomy, colostomy reversal and take down. Patient is not able to have a bowel movement for the past 4 days. EXAM: CT ABDOMEN AND PELVIS WITH CONTRAST TECHNIQUE: Multidetector CT imaging of the abdomen and pelvis was performed using the standard protocol following bolus administration of intravenous contrast. CONTRAST:  151mL OMNIPAQUE IOHEXOL 300 MG/ML  SOLN COMPARISON:  CT abdomen pelvis dated 11/18/2019. FINDINGS: Lower chest: The visualized lung bases are clear. No intra-abdominal free air. Small amount of free fluid inferior to the liver and along the right lower quadrant. Hepatobiliary: Mild irregularity of the liver contour may represent early changes of cirrhosis. No intrahepatic biliary duct dilatation. The gallbladder is unremarkable. Pancreas: Unremarkable. No pancreatic ductal dilatation or surrounding inflammatory changes. Spleen: Normal in size without focal abnormality. Adrenals/Urinary Tract: The adrenal glands are unremarkable. There is no hydronephrosis on either side. There is symmetric enhancement and excretion of contrast by both kidneys. Bilateral renal cysts measure up to 8 cm in the inferior pole the left kidney. Several subcentimeter bilateral renal hypodense lesions are too small to characterize. There is a 2 mm nonobstructing right renal interpolar stone. The visualized ureters and urinary bladder appear unremarkable. Stomach/Bowel: There is postsurgical changes of prior sigmoid resection with anastomotic suture. There may be mild narrowing of the colon at the level of the anastomosis without complete obstruction. There is distension of the colon proximal to the level of the anastomosis. The ascending colon  measures approximately 7 cm in diameter. This is new or progressed since the prior CT. Several top-normal caliber loops of small bowel in the left hemiabdomen measure up to 3 cm in caliber. There is mild edema of these loops of small bowel and adjacent mesentery. There is a focus of narrowing in the mid abdomen (series 3, image 49 and coronal series 6, image 48) with tethering of adjacent bowel loops concerning for a degree of obstruction related to adhesion. Contrast however passes through this narrowed segment.  The small bowel loops distal to this segment are collapsed. Vascular/Lymphatic: Mild aortoiliac atherosclerotic disease. The IVC is unremarkable. No portal venous gas. There is no adenopathy. Reproductive: Enlarged prostate gland measuring 7 cm in transverse axial diameter. The seminal vesicles are symmetric. Other: Small fat containing umbilical hernia Musculoskeletal: Degenerative changes of the spine. L4-L5 posterior decompression and interbody graft. No acute osseous pathology. IMPRESSION: 1. Small-bowel obstruction with transition in the mid abdomen, likely secondary to adhesion. 2. Mildly edematous small bowel wall with stranding of the adjacent mesentery and small amount of free fluid likely reactive to obstruction. Mild enteritis is not excluded. 3. Mildly distended colon proximal to the sigmoid anastomosis. A degree of obstruction or mild narrowing at the level of the anastomosis is not excluded. 4. Additional nonacute findings as above. Electronically Signed   By: Anner Crete M.D.   On: 12/21/2019 17:16    Review of Systems  Constitutional: Negative for chills and fever.  HENT: Negative for hearing loss and tinnitus.   Respiratory: Negative for cough and shortness of breath.   Cardiovascular: Negative for chest pain and palpitations.  Gastrointestinal: Positive for abdominal pain, nausea and vomiting.  Genitourinary: Negative for dysuria, frequency and urgency.  Musculoskeletal:  Negative for myalgias.  Skin: Negative for rash.  Neurological: Negative for dizziness and headaches.    Blood pressure 124/79, pulse (!) 57, temperature 98.1 F (36.7 C), temperature source Oral, resp. rate 18, height 6\' 1"  (1.854 m), weight 104 kg, SpO2 98 %. Physical Exam  Constitutional: He is oriented to person, place, and time. He appears well-developed and well-nourished. No distress.  HENT:  Head: Normocephalic and atraumatic.  Eyes: Pupils are equal, round, and reactive to light. Conjunctivae and EOM are normal.  Cardiovascular: Normal rate and regular rhythm.  Respiratory: Effort normal. No respiratory distress.  GI: Soft. He exhibits no distension. There is no abdominal tenderness.  Musculoskeletal:        General: Normal range of motion.     Cervical back: Normal range of motion and neck supple.  Neurological: He is alert and oriented to person, place, and time.  Skin: Skin is warm and dry.     Assessment/Plan 74 y.o. M with SBO.  Admit to floor. COvid test.  NG tube. IVF's.  Possible OR this weekend.    Rosario Adie, MD [phone], 6:06 PM

## 2019-12-22 NOTE — H&P (Signed)
James Doyle is an 74 y.o. male.   Chief Complaint: nausea, vomiting and abd pain HPI: 74 y.o. M who was admitted several weeks ago with SBO symptoms that resolved quickly.  Since that time, he has been having trouble with continued episodes nausea and vomiting.  Repeat CT shows continued SBO.  I have recommended admission and possible surgery.    Past Medical History:  Diagnosis Date  . Aortic atherosclerosis ([place]) 12/27/2017   Noted on CT abd/pelvis  . Arthritis   . Cancer of left colon ([place]) 06/08/2017  . Cellulitis of left hand   . Chronic back pain   . Chronic pain of right knee   . Chronic right hip pain   . 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 DDD (degenerative disc disease), lumbar   . H/O vocal cord paralysis    left   . History of hypokalemia   . History of thumb fracture    Left  . Left nephrolithiasis 12/27/2017   Noted on CT abd/pelvis    Past Surgical History:  Procedure Laterality Date  . BACK SURGERY     times 2  . COLON RESECTION    . COLONOSCOPY    . COLOSTOMY    . COLOSTOMY TAKEDOWN N/A 02/09/2018   Procedure: LAPAROSCOPIC COLOSTOMY REVERSAL ERAS PATHWAY;  Surgeon: Leighton Ruff, MD;  Location: [facility];  Service: General;  Laterality: N/A;  . INNER EAR SURGERY     bilat  . LAPAROSCOPY N/A 06/09/2017   Procedure: LAPAROSCOPY DIAGNOSTIC, OPEN  LEFT COLECTOMY;  Surgeon: Leighton Ruff, MD;  Location: [facility];  Service: General;  Laterality: N/A;  . PROSTATE BIOPSY    . TONSILLECTOMY    . TOTAL KNEE ARTHROPLASTY Right 07/27/2016   Procedure: RIGHT TOTAL KNEE ARTHROPLASTY;  Surgeon: Gaynelle Arabian, MD;  Location: [facility];  Service: Orthopedics;  Laterality: Right;    Family History  Problem Relation Age of Onset  . Heart failure Mother   . Stroke Father   . Cancer Maternal Uncle        colon cancer   Social History:  reports that he has never smoked. He has never used smokeless tobacco. He reports that he does not drink alcohol  or use drugs.  Allergies: No Known Allergies  (Not in a hospital admission)   No results found for this or any previous visit (from the past 48 hour(s)). CT ABDOMEN PELVIS W CONTRAST  Result Date: 12/21/2019 CLINICAL DATA:  74 year old male with history of colon cancer status post colectomy and colostomy, colostomy reversal and take down. Patient is not able to have a bowel movement for the past 4 days. EXAM: CT ABDOMEN AND PELVIS WITH CONTRAST TECHNIQUE: Multidetector CT imaging of the abdomen and pelvis was performed using the standard protocol following bolus administration of intravenous contrast. CONTRAST:  168mL OMNIPAQUE IOHEXOL 300 MG/ML  SOLN COMPARISON:  CT abdomen pelvis dated 11/18/2019. FINDINGS: Lower chest: The visualized lung bases are clear. No intra-abdominal free air. Small amount of free fluid inferior to the liver and along the right lower quadrant. Hepatobiliary: Mild irregularity of the liver contour may represent early changes of cirrhosis. No intrahepatic biliary duct dilatation. The gallbladder is unremarkable. Pancreas: Unremarkable. No pancreatic ductal dilatation or surrounding inflammatory changes. Spleen: Normal in size without focal abnormality. Adrenals/Urinary Tract: The adrenal glands are unremarkable. There is no hydronephrosis on either side. There is symmetric enhancement and excretion of contrast by both kidneys. Bilateral renal cysts measure up to 8 cm  in the inferior pole the left kidney. Several subcentimeter bilateral renal hypodense lesions are too small to characterize. There is a 2 mm nonobstructing right renal interpolar stone. The visualized ureters and urinary bladder appear unremarkable. Stomach/Bowel: There is postsurgical changes of prior sigmoid resection with anastomotic suture. There may be mild narrowing of the colon at the level of the anastomosis without complete obstruction. There is distension of the colon proximal to the level of the anastomosis.  The ascending colon measures approximately 7 cm in diameter. This is new or progressed since the prior CT. Several top-normal caliber loops of small bowel in the left hemiabdomen measure up to 3 cm in caliber. There is mild edema of these loops of small bowel and adjacent mesentery. There is a focus of narrowing in the mid abdomen (series 3, image 49 and coronal series 6, image 48) with tethering of adjacent bowel loops concerning for a degree of obstruction related to adhesion. Contrast however passes through this narrowed segment. The small bowel loops distal to this segment are collapsed. Vascular/Lymphatic: Mild aortoiliac atherosclerotic disease. The IVC is unremarkable. No portal venous gas. There is no adenopathy. Reproductive: Enlarged prostate gland measuring 7 cm in transverse axial diameter. The seminal vesicles are symmetric. Other: Small fat containing umbilical hernia Musculoskeletal: Degenerative changes of the spine. L4-L5 posterior decompression and interbody graft. No acute osseous pathology. IMPRESSION: 1. Small-bowel obstruction with transition in the mid abdomen, likely secondary to adhesion. 2. Mildly edematous small bowel wall with stranding of the adjacent mesentery and small amount of free fluid likely reactive to obstruction. Mild enteritis is not excluded. 3. Mildly distended colon proximal to the sigmoid anastomosis. A degree of obstruction or mild narrowing at the level of the anastomosis is not excluded. 4. Additional nonacute findings as above. Electronically Signed   By: Anner Crete M.D.   On: 12/21/2019 17:16    Review of Systems  Constitutional: Negative for chills and fever.  HENT: Negative for hearing loss and tinnitus.   Respiratory: Negative for cough and shortness of breath.   Cardiovascular: Negative for chest pain and palpitations.  Gastrointestinal: Positive for abdominal pain, nausea and vomiting.  Genitourinary: Negative for dysuria, frequency and urgency.   Musculoskeletal: Negative for myalgias.  Skin: Negative for rash.  Neurological: Negative for dizziness and headaches.    There were no vitals taken for this visit. Physical Exam  Constitutional: He is oriented to person, place, and time. He appears well-developed and well-nourished. No distress.  HENT:  Head: Normocephalic and atraumatic.  Eyes: Pupils are equal, round, and reactive to light. Conjunctivae and EOM are normal.  Cardiovascular: Normal rate and regular rhythm.  Respiratory: Effort normal. No respiratory distress.  GI: Soft. He exhibits no distension. There is no abdominal tenderness.  Musculoskeletal:        General: Normal range of motion.     Cervical back: Normal range of motion and neck supple.  Neurological: He is alert and oriented to person, place, and time.  Skin: Skin is warm and dry.     Assessment/Plan 74 y.o. M with SBO.  Admit to floor. COvid test.  NG tube. IVF's.  Possible OR this weekend.    Rosario Adie, MD [phone], 3:10 PM

## 2019-12-23 LAB — SARS CORONAVIRUS 2 (TAT 6-24 HRS): SARS Coronavirus 2: NEGATIVE

## 2019-12-23 LAB — SURGICAL PCR SCREEN
MRSA, PCR: NEGATIVE
Staphylococcus aureus: POSITIVE — AB

## 2019-12-23 MED ORDER — PHENOL 1.4 % MT LIQD
2.0000 | OROMUCOSAL | Status: DC | PRN
  Filled 2019-12-23: qty 177

## 2019-12-23 MED ORDER — MUPIROCIN 2 % EX OINT
1.0000 "application " | TOPICAL_OINTMENT | Freq: Two times a day (BID) | CUTANEOUS | Status: AC
  Administered 2019-12-24 – 2019-12-27 (×6): 1 via NASAL
  Filled 2019-12-23 (×2): qty 22

## 2019-12-23 MED ORDER — CEFAZOLIN SODIUM-DEXTROSE 2-4 GM/100ML-% IV SOLN
2.0000 g | INTRAVENOUS | Status: AC
  Administered 2019-12-24: 2 g via INTRAVENOUS
  Filled 2019-12-23: qty 100

## 2019-12-23 MED ORDER — CHLORHEXIDINE GLUCONATE CLOTH 2 % EX PADS
6.0000 | MEDICATED_PAD | Freq: Every day | CUTANEOUS | Status: AC
  Administered 2019-12-24: 6 via TOPICAL

## 2019-12-23 NOTE — Progress Notes (Signed)
SBO (small bowel obstruction) (HCC)  Subjective: C/o sore throat from the NG  Objective: Vital signs in last 24 hours: Temp:  [97.9 F (36.6 C)-98.5 F (36.9 C)] 98.5 F (36.9 C) (01/23 0443) Pulse Rate:  [57-61] 60 (01/23 0443) Resp:  [18] 18 (01/23 0443) BP: (124-159)/(75-85) 154/75 (01/23 0443) SpO2:  [94 %-98 %] 94 % (01/23 0443) Weight:  [104 kg] 104 kg (01/22 1631) Last BM Date: 12/21/19  Intake/Output from previous day: 01/22 0701 - 01/23 0700 In: -  Out: 700 [Urine:700] Intake/Output this shift: Total I/O In: -  Out: 150 [Emesis/NG output:150]  General appearance: alert and cooperative GI: normal findings: soft, nondistended   Lab Results:  Results for orders placed or performed during the hospital encounter of 12/22/19 (from the past 24 hour(s))  CBC     Status: None   Collection Time: 12/22/19  3:25 PM  Result Value Ref Range   WBC 10.5 4.0 - 10.5 K/uL   RBC 5.23 4.22 - 5.81 MIL/uL   Hemoglobin 15.0 13.0 - 17.0 g/dL   HCT 47.0 39.0 - 52.0 %   MCV 89.9 80.0 - 100.0 fL   MCH 28.7 26.0 - 34.0 pg   MCHC 31.9 30.0 - 36.0 g/dL   RDW 12.4 11.5 - 15.5 %   Platelets 184 150 - 400 K/uL   nRBC 0.0 0.0 - 0.2 %  Comprehensive metabolic panel     Status: Abnormal   Collection Time: 12/22/19  3:25 PM  Result Value Ref Range   Sodium 139 135 - 145 mmol/L   Potassium 3.6 3.5 - 5.1 mmol/L   Chloride 103 98 - 111 mmol/L   CO2 27 22 - 32 mmol/L   Glucose, Bld 108 (H) 70 - 99 mg/dL   BUN 14 8 - 23 mg/dL   Creatinine, Ser 1.12 0.61 - 1.24 mg/dL   Calcium 9.3 8.9 - 10.3 mg/dL   Total Protein 7.1 6.5 - 8.1 g/dL   Albumin 4.1 3.5 - 5.0 g/dL   AST 29 15 - 41 U/L   ALT 34 0 - 44 U/L   Alkaline Phosphatase 72 38 - 126 U/L   Total Bilirubin 0.9 0.3 - 1.2 mg/dL   GFR calc non Af Amer >60 >60 mL/min   GFR calc Af Amer >60 >60 mL/min   Anion gap 9 5 - 15  SARS CORONAVIRUS 2 (TAT 6-24 HRS) Nasopharyngeal Nasopharyngeal Swab     Status: None   Collection Time: 12/22/19   5:13 PM   Specimen: Nasopharyngeal Swab  Result Value Ref Range   SARS Coronavirus 2 NEGATIVE NEGATIVE     Studies/Results Radiology     MEDS, Scheduled . dorzolamide-timolol  1 drop Both Eyes BID  . enoxaparin (LOVENOX) injection  40 mg Subcutaneous Q24H  . mupirocin ointment  1 application Nasal BID     Assessment: SBO (small bowel obstruction) (HCC)   Plan: OR tom for diagnostic laparoscopy and possible LOA.     LOS: 1 day    Rosario Adie, [facility], [place]    12/23/2019 9:29 AM

## 2019-12-23 NOTE — Progress Notes (Signed)
Spoke with Dr. Kae Heller of [facility] and oncall for [facility]. Advised of patient's pulse. No new orders given. Advised to continue to monitor patient.

## 2019-12-23 NOTE — Progress Notes (Signed)
Notified by tech that pulse was 30 per [employer]. Patient's pulse was reassessed. Pulse was 56 in right wrist and 51 in left wrist, respectively. Both pulse rates were obtained manually. Pulse rate is irregular. Patient is alert, cooperative, and does not exhibit any signs of distress, discomfort, or decline in health status. Charge nurse and attending physician notified.

## 2019-12-24 ENCOUNTER — Inpatient Hospital Stay (HOSPITAL_COMMUNITY): Payer: Medicare Other | Admitting: Certified Registered Nurse Anesthetist

## 2019-12-24 ENCOUNTER — Encounter (HOSPITAL_COMMUNITY): Payer: Self-pay | Admitting: General Surgery

## 2019-12-24 ENCOUNTER — Encounter (HOSPITAL_COMMUNITY): Admission: AD | Disposition: A | Discharge status: Home / Self Care | Payer: Self-pay | Source: Ambulatory Visit

## 2019-12-24 HISTORY — PX: LAPAROTOMY: SHX154

## 2019-12-24 HISTORY — PX: LAPAROSCOPY: SHX197

## 2019-12-24 SURGERY — LAPAROSCOPY, DIAGNOSTIC
Anesthesia: General | Site: Abdomen

## 2019-12-24 MED ORDER — LACTATED RINGERS IV SOLN
INTRAVENOUS | Status: AC | PRN
  Administered 2019-12-24: 1000 mL

## 2019-12-24 MED ORDER — ENOXAPARIN SODIUM 40 MG/0.4ML ~~LOC~~ SOLN
40.0000 mg | SUBCUTANEOUS | Status: DC
  Administered 2019-12-25 – 2019-12-28 (×4): 40 mg via SUBCUTANEOUS
  Filled 2019-12-24 (×4): qty 0.4

## 2019-12-24 MED ORDER — ONDANSETRON HCL 4 MG/2ML IJ SOLN
INTRAMUSCULAR | Status: AC
  Filled 2019-12-24: qty 2

## 2019-12-24 MED ORDER — ACETAMINOPHEN 10 MG/ML IV SOLN
INTRAVENOUS | Status: DC | PRN
  Administered 2019-12-24: 1000 mg via INTRAVENOUS

## 2019-12-24 MED ORDER — PHENYLEPHRINE HCL-NACL 10-0.9 MG/250ML-% IV SOLN
INTRAVENOUS | Status: DC | PRN
  Administered 2019-12-24: 20 ug/min via INTRAVENOUS

## 2019-12-24 MED ORDER — ACETAMINOPHEN 10 MG/ML IV SOLN
INTRAVENOUS | Status: AC
  Filled 2019-12-24: qty 100

## 2019-12-24 MED ORDER — SUGAMMADEX SODIUM 200 MG/2ML IV SOLN
INTRAVENOUS | Status: DC | PRN
  Administered 2019-12-24: 200 mg via INTRAVENOUS

## 2019-12-24 MED ORDER — SENNOSIDES-DOCUSATE SODIUM 8.6-50 MG PO TABS
1.0000 | ORAL_TABLET | Freq: Every day | ORAL | Status: DC
  Administered 2019-12-25 – 2019-12-27 (×3): 1 via ORAL
  Filled 2019-12-24 (×3): qty 1

## 2019-12-24 MED ORDER — ONDANSETRON 4 MG PO TBDP: 4.0000 mg | ORAL_TABLET | Freq: Four times a day (QID) | ORAL | Status: DC | PRN

## 2019-12-24 MED ORDER — SUCCINYLCHOLINE CHLORIDE 200 MG/10ML IV SOSY
PREFILLED_SYRINGE | INTRAVENOUS | Status: DC | PRN
  Administered 2019-12-24: 100 mg via INTRAVENOUS

## 2019-12-24 MED ORDER — ROCURONIUM BROMIDE 10 MG/ML (PF) SYRINGE
PREFILLED_SYRINGE | INTRAVENOUS | Status: AC
  Filled 2019-12-24: qty 10

## 2019-12-24 MED ORDER — GLYCOPYRROLATE PF 0.2 MG/ML IJ SOSY
PREFILLED_SYRINGE | INTRAMUSCULAR | Status: DC | PRN
  Administered 2019-12-24: .2 mg via INTRAVENOUS

## 2019-12-24 MED ORDER — DEXAMETHASONE SODIUM PHOSPHATE 10 MG/ML IJ SOLN
INTRAMUSCULAR | Status: DC | PRN
  Administered 2019-12-24: 10 mg via INTRAVENOUS

## 2019-12-24 MED ORDER — BUPIVACAINE HCL 0.25 % IJ SOLN
INTRAMUSCULAR | Status: DC | PRN
  Administered 2019-12-24: 30 mL

## 2019-12-24 MED ORDER — SIMETHICONE 80 MG PO CHEW: 40.0000 mg | CHEWABLE_TABLET | Freq: Four times a day (QID) | ORAL | Status: DC | PRN

## 2019-12-24 MED ORDER — SODIUM CHLORIDE 0.9 % IV BOLUS
500.0000 mL | Freq: Once | INTRAVENOUS | Status: AC
  Administered 2019-12-24: 500 mL via INTRAVENOUS

## 2019-12-24 MED ORDER — 0.9 % SODIUM CHLORIDE (POUR BTL) OPTIME
TOPICAL | Status: DC | PRN
  Administered 2019-12-24: 2000 mL

## 2019-12-24 MED ORDER — LACTATED RINGERS IV SOLN: INTRAVENOUS | Status: DC | PRN

## 2019-12-24 MED ORDER — PROPOFOL 10 MG/ML IV BOLUS
INTRAVENOUS | Status: DC | PRN
  Administered 2019-12-24: 100 mg via INTRAVENOUS

## 2019-12-24 MED ORDER — PHENYLEPHRINE 40 MCG/ML (10ML) SYRINGE FOR IV PUSH (FOR BLOOD PRESSURE SUPPORT)
PREFILLED_SYRINGE | INTRAVENOUS | Status: DC | PRN
  Administered 2019-12-24 (×2): 120 ug via INTRAVENOUS

## 2019-12-24 MED ORDER — BISACODYL 10 MG RE SUPP: 10.0000 mg | Freq: Every day | RECTAL | Status: DC | PRN

## 2019-12-24 MED ORDER — FENTANYL CITRATE (PF) 100 MCG/2ML IJ SOLN
INTRAMUSCULAR | Status: AC
  Filled 2019-12-24: qty 4

## 2019-12-24 MED ORDER — ONDANSETRON HCL 4 MG/2ML IJ SOLN
4.0000 mg | Freq: Once | INTRAMUSCULAR | Status: AC | PRN
  Administered 2019-12-24: 4 mg via INTRAVENOUS

## 2019-12-24 MED ORDER — LABETALOL HCL 5 MG/ML IV SOLN
10.0000 mg | Freq: Once | INTRAVENOUS | Status: AC
  Administered 2019-12-24: 10 mg via INTRAVENOUS

## 2019-12-24 MED ORDER — CHLORHEXIDINE GLUCONATE CLOTH 2 % EX PADS
6.0000 | MEDICATED_PAD | Freq: Every day | CUTANEOUS | Status: DC
  Administered 2019-12-24 – 2019-12-28 (×4): 6 via TOPICAL

## 2019-12-24 MED ORDER — BRIMONIDINE TARTRATE 0.2 % OP SOLN
1.0000 [drp] | Freq: Two times a day (BID) | OPHTHALMIC | Status: DC
  Filled 2019-12-24: qty 5

## 2019-12-24 MED ORDER — LIDOCAINE 2% (20 MG/ML) 5 ML SYRINGE
INTRAMUSCULAR | Status: DC | PRN
  Administered 2019-12-24: 80 mg via INTRAVENOUS

## 2019-12-24 MED ORDER — KCL IN DEXTROSE-NACL 20-5-0.45 MEQ/L-%-% IV SOLN
INTRAVENOUS | Status: DC
  Filled 2019-12-24 (×6): qty 1000

## 2019-12-24 MED ORDER — ROCURONIUM BROMIDE 50 MG/5ML IV SOSY
PREFILLED_SYRINGE | INTRAVENOUS | Status: DC | PRN
  Administered 2019-12-24 (×2): 10 mg via INTRAVENOUS
  Administered 2019-12-24: 60 mg via INTRAVENOUS

## 2019-12-24 MED ORDER — ONDANSETRON HCL 4 MG/2ML IJ SOLN
INTRAMUSCULAR | Status: DC | PRN
  Administered 2019-12-24: 4 mg via INTRAVENOUS

## 2019-12-24 MED ORDER — LABETALOL HCL 5 MG/ML IV SOLN
INTRAVENOUS | Status: AC
  Filled 2019-12-24: qty 4

## 2019-12-24 MED ORDER — FENTANYL CITRATE (PF) 100 MCG/2ML IJ SOLN
25.0000 ug | INTRAMUSCULAR | Status: DC | PRN
  Administered 2019-12-24 (×2): 50 ug via INTRAVENOUS

## 2019-12-24 MED ORDER — SUCCINYLCHOLINE CHLORIDE 200 MG/10ML IV SOSY
PREFILLED_SYRINGE | INTRAVENOUS | Status: AC
  Filled 2019-12-24: qty 10

## 2019-12-24 MED ORDER — ONDANSETRON HCL 4 MG/2ML IJ SOLN
4.0000 mg | Freq: Four times a day (QID) | INTRAMUSCULAR | Status: DC | PRN
  Administered 2019-12-24: 4 mg via INTRAVENOUS
  Filled 2019-12-24: qty 2

## 2019-12-24 MED ORDER — FENTANYL CITRATE (PF) 250 MCG/5ML IJ SOLN
INTRAMUSCULAR | Status: AC
  Filled 2019-12-24: qty 5

## 2019-12-24 MED ORDER — FENTANYL CITRATE (PF) 100 MCG/2ML IJ SOLN
INTRAMUSCULAR | Status: DC | PRN
  Administered 2019-12-24: 100 ug via INTRAVENOUS
  Administered 2019-12-24 (×3): 50 ug via INTRAVENOUS

## 2019-12-24 MED ORDER — PROPOFOL 10 MG/ML IV BOLUS
INTRAVENOUS | Status: AC
  Filled 2019-12-24: qty 20

## 2019-12-24 MED ORDER — BUPIVACAINE HCL (PF) 0.25 % IJ SOLN
INTRAMUSCULAR | Status: AC
  Filled 2019-12-24: qty 30

## 2019-12-24 MED ORDER — MORPHINE SULFATE (PF) 4 MG/ML IV SOLN: 2.0000 mg | INTRAVENOUS | Status: DC | PRN

## 2019-12-24 MED ORDER — LIDOCAINE 2% (20 MG/ML) 5 ML SYRINGE
INTRAMUSCULAR | Status: AC
  Filled 2019-12-24: qty 5

## 2019-12-24 MED ORDER — HYDROCODONE-ACETAMINOPHEN 5-325 MG PO TABS: 1.0000 | ORAL_TABLET | ORAL | Status: DC | PRN

## 2019-12-24 SURGICAL SUPPLY — 65 items
APPLIER CLIP 5 13 M/L LIGAMAX5 (MISCELLANEOUS)
APPLIER CLIP ROT 10 11.4 M/L (STAPLE)
BLADE EXTENDED COATED 6.5IN (ELECTRODE) IMPLANT
CELLS DAT CNTRL 66122 CELL SVR (MISCELLANEOUS) ×1 IMPLANT
CHLORAPREP W/TINT 26 (MISCELLANEOUS) ×6 IMPLANT
CLIP APPLIE 5 13 M/L LIGAMAX5 (MISCELLANEOUS) IMPLANT
CLIP APPLIE ROT 10 11.4 M/L (STAPLE) IMPLANT
COVER MAYO STAND STRL (DRAPES) ×3 IMPLANT
COVER WAND RF STERILE (DRAPES) IMPLANT
DECANTER SPIKE VIAL GLASS SM (MISCELLANEOUS) IMPLANT
DERMABOND ADVANCED (GAUZE/BANDAGES/DRESSINGS)
DERMABOND ADVANCED .7 DNX12 (GAUZE/BANDAGES/DRESSINGS) IMPLANT
DRAIN CHANNEL 19F RND (DRAIN) IMPLANT
DRAPE LAPAROSCOPIC ABDOMINAL (DRAPES) IMPLANT
DRAPE SHEET LG 3/4 BI-LAMINATE (DRAPES) IMPLANT
DRSG OPSITE POSTOP 4X10 (GAUZE/BANDAGES/DRESSINGS) IMPLANT
DRSG OPSITE POSTOP 4X6 (GAUZE/BANDAGES/DRESSINGS) ×3 IMPLANT
DRSG OPSITE POSTOP 4X8 (GAUZE/BANDAGES/DRESSINGS) IMPLANT
ELECT REM PT RETURN 15FT ADLT (MISCELLANEOUS) ×3 IMPLANT
EVACUATOR SILICONE 100CC (DRAIN) IMPLANT
GAUZE SPONGE 4X4 12PLY STRL (GAUZE/BANDAGES/DRESSINGS) IMPLANT
GLOVE BIO SURGEON STRL SZ 6.5 (GLOVE) ×4 IMPLANT
GLOVE BIO SURGEONS STRL SZ 6.5 (GLOVE) ×2
GLOVE BIOGEL PI IND STRL 7.0 (GLOVE) ×2 IMPLANT
GLOVE BIOGEL PI INDICATOR 7.0 (GLOVE) ×4
GOWN STRL REUS W/TWL XL LVL3 (GOWN DISPOSABLE) ×9 IMPLANT
HANDLE SUCTION POOLE (INSTRUMENTS) IMPLANT
IRRIG SUCT STRYKERFLOW 2 WTIP (MISCELLANEOUS)
IRRIGATION SUCT STRKRFLW 2 WTP (MISCELLANEOUS) IMPLANT
KIT TURNOVER KIT A (KITS) IMPLANT
LEGGING LITHOTOMY PAIR STRL (DRAPES) IMPLANT
PACK COLON (CUSTOM PROCEDURE TRAY) IMPLANT
PENCIL SMOKE EVACUATOR (MISCELLANEOUS) IMPLANT
RELOAD PROXIMATE 75MM BLUE (ENDOMECHANICALS) ×15 IMPLANT
RETRACTOR WND ALEXIS 25 LRG (MISCELLANEOUS) IMPLANT
RTRCTR WOUND ALEXIS 18CM MED (MISCELLANEOUS) ×3
RTRCTR WOUND ALEXIS 25CM LRG (MISCELLANEOUS)
SEALER TISSUE G2 STRG ARTC 35C (ENDOMECHANICALS) IMPLANT
SLEEVE XCEL OPT CAN 5 100 (ENDOMECHANICALS) ×6 IMPLANT
SPONGE LAP 18X18 RF (DISPOSABLE) IMPLANT
STAPLER GUN LINEAR PROX 60 (STAPLE) ×3 IMPLANT
STAPLER PROXIMATE 75MM BLUE (STAPLE) ×3 IMPLANT
STAPLER VISISTAT 35W (STAPLE) IMPLANT
SUCTION POOLE HANDLE (INSTRUMENTS)
SUT ETHILON 3 0 PS 1 (SUTURE) IMPLANT
SUT NOVA 1 T20/GS 25DT (SUTURE) IMPLANT
SUT PDS AB 1 CTX 36 (SUTURE) IMPLANT
SUT PDS AB 1 TP1 96 (SUTURE) IMPLANT
SUT PROLENE 2 0 KS (SUTURE) IMPLANT
SUT PROLENE 2 0 SH DA (SUTURE) IMPLANT
SUT SILK 2 0 (SUTURE) ×2
SUT SILK 2 0 SH CR/8 (SUTURE) ×6 IMPLANT
SUT SILK 2-0 18XBRD TIE 12 (SUTURE) ×1 IMPLANT
SUT SILK 3 0 (SUTURE) ×2
SUT SILK 3 0 SH CR/8 (SUTURE) IMPLANT
SUT SILK 3-0 18XBRD TIE 12 (SUTURE) ×1 IMPLANT
SUT VIC AB 2-0 SH 18 (SUTURE) IMPLANT
TOWEL OR 17X26 10 PK STRL BLUE (TOWEL DISPOSABLE) IMPLANT
TOWEL OR NON WOVEN STRL DISP B (DISPOSABLE) ×3 IMPLANT
TRAY FOLEY MTR SLVR 16FR STAT (SET/KITS/TRAYS/PACK) ×3 IMPLANT
TROCAR BLADELESS OPT 5 100 (ENDOMECHANICALS) ×3 IMPLANT
TROCAR XCEL BLUNT TIP 100MML (ENDOMECHANICALS) IMPLANT
TROCAR XCEL NON-BLD 11X100MML (ENDOMECHANICALS) IMPLANT
TUBING CONNECTING 10 (TUBING) IMPLANT
TUBING CONNECTING 10' (TUBING)

## 2019-12-24 NOTE — Progress Notes (Addendum)
During final assessment of patient before shift change, noted urinary output of 200 ml in standard drainage bag. Urine is amber in color and clear. Bladder scan completed three times with measurement of 0 mL. NG output is 0 mL.Patient has been receiving IVF @ 75 mL/hr plus an additional bolus of 500 mL IVF since returning from surgery. On-call surgeon was paged.

## 2019-12-24 NOTE — Anesthesia Procedure Notes (Signed)
Procedure Name: Intubation Date/Time: 12/24/2019 7:43 AM Performed by: Montel Clock, CRNA Pre-anesthesia Checklist: Patient identified, Emergency Drugs available, Suction available, Patient being monitored and Timeout performed Patient Re-evaluated:Patient Re-evaluated prior to induction Oxygen Delivery Method: Circle system utilized Preoxygenation: Pre-oxygenation with 100% oxygen Induction Type: IV induction and Rapid sequence Laryngoscope Size: Glidescope and 4 Grade View: Grade I Tube type: Oral Tube size: 7.5 mm Number of attempts: 1 Airway Equipment and Method: Stylet Placement Confirmation: ETT inserted through vocal cords under direct vision,  positive ETCO2 and breath sounds checked- equal and bilateral Secured at: 24 cm Tube secured with: Tape Dental Injury: Teeth and Oropharynx as per pre-operative assessment  Comments: Glidescope utilized r/t vocal cord paralysis. ETT easily passed and vocal cords appeared at normal baseline post procedure.

## 2019-12-24 NOTE — Progress Notes (Signed)
Initial Nutrition Assessment  RD working remotely.  DOCUMENTATION CODES:   Obesity unspecified  INTERVENTION:   -RD will follow for diet advancement and supplement as appropriate  NUTRITION DIAGNOSIS:   Inadequate oral intake related to altered GI function as evidenced by NPO status.  GOAL:   Patient will meet greater than or equal to 90% of their needs  MONITOR:   Diet advancement, Labs, Weight trends, Skin, I & O's  REASON FOR ASSESSMENT:   Malnutrition Screening Tool    ASSESSMENT:   74 y.o. M who was admitted several weeks ago with SBO symptoms that resolved quickly.  Since that time, he has been having trouble with continued episodes nausea and vomiting.  Repeat CT shows continued SBO.  I have recommended admission and possible surgery.  Pt admitted with SBO.  1/22- NGT placed for decompression   Reviewed I/O's: -1.1 L x 24 hours and -1.8 L since admission  UOP: 2 L x 24 hours  NGT output: 650 ml x 24 hours  Pt in OR for lap and LOA. Unable to obtain further nutrition-related history at this time.   Reviewed wt hx; pt has experienced a 6.8% wt loss over the past 5 months, which is not significant for time frame.  Medications reviewed and include lactated ringers infusion.   Labs reviewed.   Diet Order:   Diet Order            Diet NPO time specified Except for: Ice Chips  Diet effective now              EDUCATION NEEDS:   No education needs have been identified at this time  Skin:  Skin Assessment: Reviewed RN Assessment  Last BM:  12/21/19  Height:   Ht Readings from Last 1 Encounters:  12/22/19 6\' 1"  (1.854 m)    Weight:   Wt Readings from Last 1 Encounters:  12/22/19 104 kg    Ideal Body Weight:  83.6 kg  BMI:  Body mass index is 30.25 kg/m.  Estimated Nutritional Needs:   Kcal:  2300-2500  Protein:  125-140 grams  Fluid:  > 2.3 L    Coline Calkin A. Jimmye Norman, RD, LDN, [place] Registered Dietitian II Certified Diabetes  Care and Education Specialist Pager: [phone] After hours Pager: [phone]

## 2019-12-24 NOTE — Progress Notes (Signed)
Give Labetalol 10 mg in pacu, per Dr Gifford Shave.

## 2019-12-24 NOTE — Anesthesia Preprocedure Evaluation (Signed)
Anesthesia Evaluation  Patient identified by MRN, date of birth, ID band Patient awake    Reviewed: Allergy & Precautions, NPO status , Patient's Chart, lab work & pertinent test results  History of Anesthesia Complications Negative for: history of anesthetic complications  Airway Mallampati: II  TM Distance: >3 FB Neck ROM: Full    Dental  (+) Dental Advisory Given, Missing   Pulmonary neg pulmonary ROS,    Pulmonary exam normal breath sounds clear to auscultation       Cardiovascular hypertension, Pt. on medications Normal cardiovascular exam Rhythm:Regular Rate:Normal     Neuro/Psych negative neurological ROS  negative psych ROS   GI/Hepatic Neg liver ROS, SBO H/o colon cancer    Endo/Other  Obesity   Renal/GU negative Renal ROS     Musculoskeletal  (+) Arthritis ,   Abdominal   Peds  Hematology negative hematology ROS (+)   Anesthesia Other Findings Day of surgery medications reviewed with the patient.  H/O vocal cord paralysis  Reproductive/Obstetrics                             Anesthesia Physical Anesthesia Plan  ASA: III  Anesthesia Plan: General   Post-op Pain Management:    Induction: Intravenous, Rapid sequence and Cricoid pressure planned  PONV Risk Score and Plan: 3 and Dexamethasone and Ondansetron  Airway Management Planned: Oral ETT and Video Laryngoscope Planned  Additional Equipment:   Intra-op Plan:   Post-operative Plan: Possible Post-op intubation/ventilation  Informed Consent: I have reviewed the patients History and Physical, chart, labs and discussed the procedure including the risks, benefits and alternatives for the proposed anesthesia with the patient or authorized representative who has indicated his/her understanding and acceptance.     Dental advisory given  Plan Discussed with: CRNA  Anesthesia Plan Comments:          Anesthesia Quick Evaluation

## 2019-12-24 NOTE — Transfer of Care (Signed)
Immediate Anesthesia Transfer of Care Note  Patient: James Doyle  Procedure(s) Performed: LAPAROSCOPY DIAGNOSTIC lysis of adhesions (N/A Abdomen) small bowel resection (N/A Abdomen)  Patient Location: PACU  Anesthesia Type:General  Level of Consciousness: awake  Airway & Oxygen Therapy: Patient Spontanous Breathing and Patient connected to face mask oxygen  Post-op Assessment: Report given to RN and Post -op Vital signs reviewed and stable  Post vital signs: Reviewed and stable  Last Vitals:  Vitals Value Taken Time  BP 194/87 12/24/19 1015  Temp    Pulse 48 12/24/19 1017  Resp 22 12/24/19 1021  SpO2 99 % 12/24/19 1017  Vitals shown include unvalidated device data.  Last Pain:  Vitals:   12/24/19 0540  TempSrc: Oral  PainSc:          Complications: No apparent anesthesia complications

## 2019-12-24 NOTE — Progress Notes (Signed)
SBO (small bowel obstruction) (HCC)  Subjective: No overnight issues  Objective: Vital signs in last 24 hours: Temp:  [98.2 F (36.8 C)-99.5 F (37.5 C)] 98.2 F (36.8 C) (01/24 0540) Pulse Rate:  [30-68] 59 (01/24 0540) Resp:  [16-24] 16 (01/24 0540) BP: (150-179)/(84-93) 150/93 (01/24 0540) SpO2:  [95 %-99 %] 96 % (01/24 0540) Last BM Date: 12/21/19  Intake/Output from previous day: 01/23 0701 - 01/24 0700 In: 1489 [I.V.:1489] Out: 2625 [Urine:1975; Emesis/NG output:650] Intake/Output this shift: No intake/output data recorded.  General appearance: alert and cooperative GI: normal findings: soft, nondistended   Lab Results:  Results for orders placed or performed during the hospital encounter of 12/22/19 (from the past 24 hour(s))  Surgical PCR screen     Status: Abnormal   Collection Time: 12/23/19  8:22 AM   Specimen: Nasal Mucosa; Nasal Swab  Result Value Ref Range   MRSA, PCR NEGATIVE NEGATIVE   Staphylococcus aureus POSITIVE (A) NEGATIVE     Studies/Results Radiology     MEDS, Scheduled . dorzolamide-timolol  1 drop Both Eyes BID  . enoxaparin (LOVENOX) injection  40 mg Subcutaneous Q24H  . mupirocin ointment  1 application Nasal BID     Assessment: SBO (small bowel obstruction) (HCC)   Plan: OR today for laparoscopy and LOA.  Risks include damage to bowel or other structures, recurrence and need for additional surgery.  I believe he understands this and agrees to proceed.  All questions answered.     LOS: 2 days    Rosario Adie, MD [facility], [place]    12/24/2019 7:18 AM

## 2019-12-24 NOTE — Anesthesia Postprocedure Evaluation (Signed)
Anesthesia Post Note  Patient: James Doyle  Procedure(s) Performed: LAPAROSCOPY DIAGNOSTIC lysis of adhesions (N/A Abdomen) small bowel resection (N/A Abdomen)     Patient location during evaluation: PACU Anesthesia Type: General Level of consciousness: awake and alert Pain management: pain level controlled Vital Signs Assessment: post-procedure vital signs reviewed and stable Respiratory status: spontaneous breathing, nonlabored ventilation, respiratory function stable and patient connected to nasal cannula oxygen Cardiovascular status: blood pressure returned to baseline and stable Postop Assessment: no apparent nausea or vomiting Anesthetic complications: no    Last Vitals:  Vitals:   12/24/19 1534 12/24/19 1600  BP: 97/65 (!) 90/52  Pulse: 73 62  Resp: (!) 24 18  Temp: 36.4 C   SpO2: 96%     Last Pain:  Vitals:   12/24/19 1130  TempSrc:   PainSc: 3                  Catalina Gravel

## 2019-12-24 NOTE — Op Note (Signed)
12/24/2019  9:57 AM  PATIENT:  James Doyle  74 y.o. male  Patient Care Team: Alroy Dust, L.Marlou Sa, MD as PCP - General (Family Medicine) Leighton Ruff, MD as Consulting Physician (General Surgery) Truitt Merle, MD as Consulting Physician (Hematology) Wonda Horner, MD as Consulting Physician (Gastroenterology)  PRE-OPERATIVE DIAGNOSIS:  small bowel obstruction  POST-OPERATIVE DIAGNOSIS:  small bowel obstruction  PROCEDURE:  Procedure(s): LAPAROSCOPY DIAGNOSTIC lysis of adhesions small bowel resection    Surgeon(s): Leighton Ruff, MD  ASSISTANT: Dr Kae Heller  ANESTHESIA:   general  EBL: 61ml Total I/O In: 1400 [I.V.:1400] Out: 375 [Urine:325; Blood:50]  DRAINS: none   SPECIMEN:  Source of Specimen:  small bowel mass  DISPOSITION OF SPECIMEN:  PATHOLOGY  COUNTS:  YES  PLAN OF CARE: Pt already admitted  PATIENT DISPOSITION:  PACU - hemodynamically stable.  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  General anesthesia was induced without difficulty. SCDs were also noted to be in place prior to the initiation of anesthesia.  The patient was then prepped and draped in the usual sterile fashion.   A surgical timeout was performed indicating the correct patient, procedure, positioning and need for preoperative antibiotics.   I began by placing the patient in reverse Trendelenburg and placing a varies needle in the left upper quadrant.  I was able to enter without difficulty.  This was checked with a saline syringe.  The abdomen was insufflated to approximately 15 mmHg.  I then placed a 5 mm trocar in the right upper quadrant.  A camera was inserted and the abdomen was evaluated.  There was no injury noted due to insertion  of the trocar or Veress needle.  The Veress needle was removed.  I then placed another 5 mm port in the right lower quadrant and a third 5 mm port in the midline.  I began by lysing the omental adhesions to the abdominal wall using Bovie electrocautery and laparoscopic scissors.  Once this was taken down, I ran the bowel starting at the terminal ileum.  This was all decompressed.  I traced this back to an area of small bowel that was fixed to the mesentery.  There appeared to be a mass in the small bowel.  I carefully resected the mesentery away from this mass using laparoscopic scissors.  Once the area was free, I ran the rest of the small bowel.  A few other small adhesions were lysed.  There was no other point of obstruction back to the level of the ligament of Treitz.  Given that there was a mass in the small bowel I decided to perform a small bowel resection to evaluate this further.  I enlarged my midline incision using a 15 blade scalpel and electrocautery.  An Sandborn wound protector was placed.  The small bowel was brought out of the incision and transected proximally and distally to the mass.  There was significant edema in the proximal bowel wall causing some tearing of the serosa at the staple line.  I decided to resect another 10 cm of proximal small bowel until I got back to an area that appeared more healthy.  This was done using 75 mm blue load GIA staplers.  I then resected the distal margin.  The mesentery was then taken using cut and clamp technique with 2-0 silk suture ties.  An anastomosis was then created between the remaining 2 small bowel limbs using a 75 mm blue load GIA stapler.  The common enterotomy channel was closed using a 60 mm blue load TA stapler.  The mesenteric defect was reapproximated using interrupted 2-0 silk sutures.  An antitension 3-0 silk suture was placed at the crotch of the anastomosis.  This was inspected for hemostasis.  Hemostasis was good.  This was then placed back  into the abdomen.  The abdomen was irrigated with 1 L of normal saline.  There was no sign of active bleeding.  The midline incision was closed using interrupted #1 Novafil sutures.  The subcutaneous tissue was reapproximated using interrupted 2-0 Vicryl sutures.  The skin was closed using a running 4-0 Vicryl subcuticular suture.  A sterile dressing was applied.  The 2 remaining port sites were closed using interrupted 4-0 Vicryl sutures and Dermabond.  Patient tolerated this well and was sent to the postanesthesia care unit in stable condition.  All counts were correct per operating room staff.

## 2019-12-24 NOTE — Progress Notes (Signed)
Patient refused to have brimonidine eye drops administered as ordered. Patient stated these eye drops caused him issues. Patient further stated he has future appt with ophthalmologist and will discuss these eye drops at that appt.

## 2019-12-25 LAB — BASIC METABOLIC PANEL
Anion gap: 6 (ref 5–15)
BUN: 16 mg/dL (ref 8–23)
CO2: 25 mmol/L (ref 22–32)
Calcium: 8.9 mg/dL (ref 8.9–10.3)
Chloride: 106 mmol/L (ref 98–111)
Creatinine, Ser: 1.26 mg/dL — ABNORMAL HIGH (ref 0.61–1.24)
GFR calc Af Amer: >60 mL/min (ref >60)
GFR calc non Af Amer: 55 mL/min — ABNORMAL LOW (ref >60)
Glucose, Bld: 140 mg/dL — ABNORMAL HIGH (ref 70–99)
Potassium: 4.4 mmol/L (ref 3.5–5.1)
Sodium: 137 mmol/L (ref 135–145)

## 2019-12-25 LAB — CBC
HCT: 39.4 % (ref 39.0–52.0)
Hemoglobin: 12.7 g/dL — ABNORMAL LOW (ref 13.0–17.0)
MCH: 29.1 pg (ref 26.0–34.0)
MCHC: 32.2 g/dL (ref 30.0–36.0)
MCV: 90.2 fL (ref 80.0–100.0)
Platelets: 195 10*3/uL (ref 150–400)
RBC: 4.37 MIL/uL (ref 4.22–5.81)
RDW: 12.6 % (ref 11.5–15.5)
WBC: 23.7 10*3/uL — ABNORMAL HIGH (ref 4.0–10.5)
nRBC: 0 % (ref 0.0–0.2)

## 2019-12-25 NOTE — Progress Notes (Signed)
This shift MD text paged by previous RN regarding pt's urine output . New orders given for 500 ml bolus and fluid increase to 125 ml/hr.

## 2019-12-25 NOTE — Progress Notes (Signed)
Patient had no ng tube output over the night.  NG tube discontinued per order.

## 2019-12-25 NOTE — Progress Notes (Signed)
1 Day Post-Op lap LOA, SBR Subjective: Had low UOP and BP overnight.  Responded well to fluid bolus.  Passing some flatus  Objective: Vital signs in last 24 hours: Temp:  [97.5 F (36.4 C)-99 F (37.2 C)] 98.8 F (37.1 C) (01/25 0543) Pulse Rate:  [29-81] 79 (01/25 0543) Resp:  [10-24] 16 (01/25 0543) BP: (90-194)/(52-104) 135/97 (01/25 0543) SpO2:  [92 %-98 %] 92 % (01/25 0543)   Intake/Output from previous day: 01/24 0701 - 01/25 0700 In: 4069.7 [I.V.:3570.2; IV Piggyback:499.5] Out: 1025 [Urine:975; Blood:50] Intake/Output this shift: No intake/output data recorded.   General appearance: alert and cooperative GI: normal findings: soft, non-tender NG: output clear Incision: no significant drainage  Lab Results:  Recent Labs    12/22/19 1525  WBC 10.5  HGB 15.0  HCT 47.0  PLT 184   BMET Recent Labs    12/22/19 1525  NA 139  K 3.6  CL 103  CO2 27  GLUCOSE 108*  BUN 14  CREATININE 1.12  CALCIUM 9.3   PT/INR No results for input(s): LABPROT, INR in the last 72 hours. ABG No results for input(s): PHART, HCO3 in the last 72 hours.  Invalid input(s): PCO2, PO2  MEDS, Scheduled . Chlorhexidine Gluconate Cloth  6 each Topical Daily  . dorzolamide-timolol  1 drop Both Eyes BID  . enoxaparin (LOVENOX) injection  40 mg Subcutaneous Q24H  . mupirocin ointment  1 application Nasal BID  . senna-docusate  1 tablet Oral QHS    Studies/Results: No results found.  Assessment: s/p Procedure(s): LAPAROSCOPY DIAGNOSTIC lysis of adhesions small bowel resection Patient Active Problem List   Diagnosis Date Noted  . SBO (small bowel obstruction) ([place]) 12/22/2019  . Abdominal distention 11/18/2019  . Small bowel obstruction, partial ([place]) 11/18/2019  . Essential hypertension 11/18/2019  . Hyperbilirubinemia 11/18/2019  . Leukocytosis 11/18/2019  . Hyperglycemia 11/18/2019  . 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 Status post end  colostomy takedown laparoscopically 02/09/2018 06/12/2017  . Cancer of left colon ([place]) 06/08/2017  . OA (osteoarthritis) of knee 07/27/2016    Doing well after surgery  Plan: d/c NG, cont NPO until better bowel function D/c foley in AM Check labs today Cont IVF's until tolerating clears   LOS: 3 days     .Rosario Adie, [facility], [place]    12/25/2019 7:35 AM

## 2019-12-25 NOTE — Care Management Important Message (Signed)
Important Message  Patient Details IM Letter given to Evette Cristal SW Case Manager to present to the Patient Name: LJ SLIMAN MRN: [id number] Date of Birth: Nov 17, 1946   Medicare Important Message Given:  Yes     [name] 12/25/2019, 9:54 AM

## 2019-12-26 ENCOUNTER — Other Ambulatory Visit: Payer: Medicare Other

## 2019-12-26 LAB — BASIC METABOLIC PANEL
Anion gap: 8 (ref 5–15)
BUN: 13 mg/dL (ref 8–23)
CO2: 23 mmol/L (ref 22–32)
Calcium: 8.6 mg/dL — ABNORMAL LOW (ref 8.9–10.3)
Chloride: 106 mmol/L (ref 98–111)
Creatinine, Ser: 1.02 mg/dL (ref 0.61–1.24)
GFR calc Af Amer: >60 mL/min (ref >60)
GFR calc non Af Amer: >60 mL/min (ref >60)
Glucose, Bld: 140 mg/dL — ABNORMAL HIGH (ref 70–99)
Potassium: 4 mmol/L (ref 3.5–5.1)
Sodium: 137 mmol/L (ref 135–145)

## 2019-12-26 LAB — CBC
HCT: 33.9 % — ABNORMAL LOW (ref 39.0–52.0)
Hemoglobin: 11 g/dL — ABNORMAL LOW (ref 13.0–17.0)
MCH: 29.2 pg (ref 26.0–34.0)
MCHC: 32.4 g/dL (ref 30.0–36.0)
MCV: 89.9 fL (ref 80.0–100.0)
Platelets: 157 10*3/uL (ref 150–400)
RBC: 3.77 MIL/uL — ABNORMAL LOW (ref 4.22–5.81)
RDW: 12.7 % (ref 11.5–15.5)
WBC: 15.2 10*3/uL — ABNORMAL HIGH (ref 4.0–10.5)
nRBC: 0 % (ref 0.0–0.2)

## 2019-12-26 LAB — SURGICAL PATHOLOGY

## 2019-12-26 NOTE — Progress Notes (Signed)
[facility] Progress Note  2 Days Post-Op  Subjective: CC: soreness around incision Patient reports incisional pain that feels sore more than anything. Denies nausea. No flatus or BM yet. Foley just removed. Discussed mobilizing today.  Review of Systems  Constitutional: Negative for chills and fever.  Respiratory: Negative for shortness of breath.   Cardiovascular: Negative for chest pain.  Gastrointestinal: Positive for abdominal pain (incisional) and constipation. Negative for diarrhea, nausea and vomiting.  Genitourinary: Negative for dysuria and urgency.  All other systems reviewed and are negative.   Objective: Vital signs in last 24 hours: Temp:  [98.8 F (37.1 C)-99.6 F (37.6 C)] 99.2 F (37.3 C) (01/26 0537) Pulse Rate:  [57-83] 75 (01/26 0900) Resp:  [16-22] 22 (01/26 0900) BP: (132-150)/(76-83) 150/76 (01/26 0900) SpO2:  [93 %-95 %] 95 % (01/26 0900) Last BM Date: 12/21/19  Intake/Output from previous day: 01/25 0701 - 01/26 0700 In: 2668.9 [I.V.:2668.9] Out: 1475 [Urine:1475] Intake/Output this shift: No intake/output data recorded.  PE: General: pleasant, WD, WN white male who is laying in bed in NAD HEENT: head is normocephalic, atraumatic.  Sclera are noninjected.  PERRL.  Ears and nose without any masses or lesions.  Mouth is pink and moist Heart: regular, rate, and rhythm.  Normal s1,s2. No obvious murmurs, gallops, or rubs noted.  Palpable radial and pedal pulses bilaterally Lungs: CTAB, no wheezes, rhonchi, or rales noted.  Respiratory effort nonlabored Abd: soft, appropriately ttp, ND, BS hypoactive, no masses, hernias, or organomegaly, incisions c/d/i MS: all 4 extremities are symmetrical with no cyanosis, clubbing, or edema. Skin: warm and dry with no masses, lesions, or rashes Neuro: Cranial nerves 2-12 grossly intact, speech is normal Psych: A&Ox3 with an appropriate affect.    Lab Results:  Recent Labs    12/25/19 0756  12/26/19 0449  WBC 23.7* 15.2*  HGB 12.7* 11.0*  HCT 39.4 33.9*  PLT 195 157   BMET Recent Labs    12/25/19 0756 12/26/19 0449  NA 137 137  K 4.4 4.0  CL 106 106  CO2 25 23  GLUCOSE 140* 140*  BUN 16 13  CREATININE 1.26* 1.02  CALCIUM 8.9 8.6*   PT/INR No results for input(s): LABPROT, INR in the last 72 hours. CMP     Component Value Date/Time   NA 137 12/26/2019 0449   NA 142 11/17/2017 0843   K 4.0 12/26/2019 0449   K 4.0 11/17/2017 0843   CL 106 12/26/2019 0449   CO2 23 12/26/2019 0449   CO2 28 11/17/2017 0843   GLUCOSE 140 (H) 12/26/2019 0449   GLUCOSE 118 11/17/2017 0843   BUN 13 12/26/2019 0449   BUN 17.5 11/17/2017 0843   CREATININE 1.02 12/26/2019 0449   CREATININE 1.3 11/17/2017 0843   CALCIUM 8.6 (L) 12/26/2019 0449   CALCIUM 9.5 11/17/2017 0843   PROT 7.1 12/22/2019 1525   PROT 6.9 11/17/2017 0843   ALBUMIN 4.1 12/22/2019 1525   ALBUMIN 4.3 11/17/2017 0843   AST 29 12/22/2019 1525   AST 23 11/17/2017 0843   ALT 34 12/22/2019 1525   ALT 15 11/17/2017 0843   ALKPHOS 72 12/22/2019 1525   ALKPHOS 79 11/17/2017 0843   BILITOT 0.9 12/22/2019 1525   BILITOT 1.05 11/17/2017 0843   GFRNONAA >60 12/26/2019 0449   GFRAA >60 12/26/2019 0449   Lipase     Component Value Date/Time   LIPASE 25 11/18/2019 0649       Studies/Results: No results found.  Anti-infectives: Anti-infectives (  From admission, onward)   Start     Dose/Rate Route Frequency Ordered Stop   12/24/19 0600  ceFAZolin (ANCEF) IVPB 2g/100 mL premix     2 g 200 mL/hr over 30 Minutes Intravenous On call to O.R. 12/23/19 0932 12/24/19 0754       Assessment/Plan Hx of colon cancer s/p resection and colostomy reversal  SBO S/p laparoscopic LOA and small bowel resection 12/24/19 Dr. Marcello Moores - POD#2 - NGT removed yesterday evening and patient denies n/v - keep on ice chips and sips until patient has flatus - needs to mobilize!!! - pathology still pending   FEN: ice chips/sips  of clears, IVF VTE: SCDs, lovenox ID: [id number]  Total time spent: 20 minutes  LOS: 4 days    Brigid Re , [facility] 12/26/2019, 10:16 AM Please see Amion for pager number during day hours 7:00am-4:30pm

## 2019-12-26 NOTE — Evaluation (Signed)
Physical Therapy Evaluation Patient Details Name: James Doyle MRN: [id number] DOB: 15-Jul-1946 Today's Date: 12/26/2019   History of Present Illness  74 yo male admitted with SBO. S/P resection, lysis of adhesions 1/24. Hx of R TKA, colostomy reversal 2019  Clinical Impression  On eval, pt was Min guard assist for mobility. He walked ~275 feet around the unit. Pt tolerated activity well. Minimal pain with activity. Will continue to follow. Encouraged pt to ambulate a few times a day with assistance/supervision from nursing.     Follow Up Recommendations No PT follow up(pt politely declines HHPT)    Equipment Recommendations  None recommended by PT    Recommendations for Other Services       Precautions / Restrictions Precautions Precautions: Fall Restrictions Weight Bearing Restrictions: No      Mobility  Bed Mobility Overal bed mobility: Needs Assistance Bed Mobility: Supine to Sit     Supine to sit: Supervision;HOB elevated        Transfers Overall transfer level: Needs assistance   Transfers: Sit to/from Stand Sit to Stand: Min guard            Ambulation/Gait Ambulation/Gait assistance: Min guard Gait Distance (Feet): 275 Feet Assistive device: None Gait Pattern/deviations: Decreased stride length;Step-through pattern     General Gait Details: unsteady but no overt LOB.  Stairs            Wheelchair Mobility    Modified Rankin (Stroke Patients Only)       Balance Overall balance assessment: Mild deficits observed, not formally tested                                           Pertinent Vitals/Pain Pain Assessment: 0-10 Pain Score: 5  Pain Location: abdomen Pain Descriptors / Indicators: Discomfort;Sore Pain Intervention(s): Monitored during session    Home Living Family/patient expects to be discharged to:: Private residence   Available Help at Discharge: Family Type of Home: House Home Access: Stairs to  enter Entrance Stairs-Rails: Doctor, general practice of Steps: 4 Home Layout: One level Home Equipment: Environmental consultant - 2 wheels      Prior Function Level of Independence: Independent               Hand Dominance        Extremity/Trunk Assessment   Upper Extremity Assessment Upper Extremity Assessment: Overall WFL for tasks assessed    Lower Extremity Assessment Lower Extremity Assessment: Generalized weakness    Cervical / Trunk Assessment Cervical / Trunk Assessment: Normal  Communication   Communication: HOH  Cognition Arousal/Alertness: Awake/alert Behavior During Therapy: WFL for tasks assessed/performed Overall Cognitive Status: Within Functional Limits for tasks assessed                                        General Comments      Exercises     Assessment/Plan    PT Assessment Patient needs continued PT services  PT Problem List Decreased mobility       PT Treatment Interventions Gait training;Therapeutic activities;Therapeutic exercise;Patient/family education;Balance training;Functional mobility training    PT Goals (Current goals can be found in the Care Plan section)  Acute Rehab PT Goals Patient Stated Goal: home soon PT Goal Formulation: With patient Time For Goal Achievement: 01/09/20 Potential to  Achieve Goals: Good    Frequency Min 3X/week   Barriers to discharge        Co-evaluation               AM-PAC PT "6 Clicks" Mobility  Outcome Measure Help needed turning from your back to your side while in a flat bed without using bedrails?: A Little Help needed moving from lying on your back to sitting on the side of a flat bed without using bedrails?: A Little Help needed moving to and from a bed to a chair (including a wheelchair)?: A Little Help needed standing up from a chair using your arms (e.g., wheelchair or bedside chair)?: A Little Help needed to walk in hospital room?: A Little Help needed  climbing 3-5 steps with a railing? : A Little 6 Click Score: 18    End of Session Equipment Utilized During Treatment: Gait belt Activity Tolerance: Patient tolerated treatment well Patient left: in chair;with call bell/phone within reach;with family/visitor present   PT Visit Diagnosis: Unsteadiness on feet (R26.81)    Time: 0865-7846 PT Time Calculation (min) (ACUTE ONLY): 21 min   Charges:   PT Evaluation $PT Eval Moderate Complexity: 1 Mod            Hartford Maulden P, PT Acute Rehabilitation

## 2019-12-27 DIAGNOSIS — C189 Malignant neoplasm of colon, unspecified: Secondary | ICD-10-CM

## 2019-12-27 DIAGNOSIS — C772 Secondary and unspecified malignant neoplasm of intra-abdominal lymph nodes: Secondary | ICD-10-CM

## 2019-12-27 LAB — CBC
HCT: 30 % — ABNORMAL LOW (ref 39.0–52.0)
Hemoglobin: 9.7 g/dL — ABNORMAL LOW (ref 13.0–17.0)
MCH: 29.6 pg (ref 26.0–34.0)
MCHC: 32.3 g/dL (ref 30.0–36.0)
MCV: 91.5 fL (ref 80.0–100.0)
Platelets: 154 10*3/uL (ref 150–400)
RBC: 3.28 MIL/uL — ABNORMAL LOW (ref 4.22–5.81)
RDW: 12.7 % (ref 11.5–15.5)
WBC: 10.6 10*3/uL — ABNORMAL HIGH (ref 4.0–10.5)
nRBC: 0 % (ref 0.0–0.2)

## 2019-12-27 LAB — BASIC METABOLIC PANEL
Anion gap: 8 (ref 5–15)
BUN: 11 mg/dL (ref 8–23)
CO2: 24 mmol/L (ref 22–32)
Calcium: 8.5 mg/dL — ABNORMAL LOW (ref 8.9–10.3)
Chloride: 108 mmol/L (ref 98–111)
Creatinine, Ser: 0.91 mg/dL (ref 0.61–1.24)
GFR calc Af Amer: >60 mL/min (ref >60)
GFR calc non Af Amer: >60 mL/min (ref >60)
Glucose, Bld: 121 mg/dL — ABNORMAL HIGH (ref 70–99)
Potassium: 3.9 mmol/L (ref 3.5–5.1)
Sodium: 140 mmol/L (ref 135–145)

## 2019-12-27 MED ORDER — SODIUM CHLORIDE 0.9 % IV SOLN: 250.0000 mL | INTRAVENOUS | Status: DC | PRN

## 2019-12-27 MED ORDER — SODIUM CHLORIDE 0.9% FLUSH: 3.0000 mL | INTRAVENOUS | Status: DC | PRN

## 2019-12-27 MED ORDER — SODIUM CHLORIDE 0.9% FLUSH
3.0000 mL | Freq: Two times a day (BID) | INTRAVENOUS | Status: DC
  Administered 2019-12-27: 3 mL via INTRAVENOUS

## 2019-12-27 NOTE — Discharge Instructions (Signed)
[facility], [place] [phone]  OPEN ABDOMINAL SURGERY: POST OP INSTRUCTIONS  Always review your discharge instruction sheet given to you by the facility where your surgery was performed.  IF YOU HAVE DISABILITY OR FAMILY LEAVE FORMS, YOU MUST BRING THEM TO THE OFFICE FOR PROCESSING.  PLEASE DO NOT GIVE THEM TO YOUR DOCTOR.  1. A prescription for pain medication may be given to you upon discharge.  Take your pain medication as prescribed, if needed.  If narcotic pain medicine is not needed, then you may take acetaminophen (Tylenol) or ibuprofen (Advil) as needed. 2. Take your usually prescribed medications unless otherwise directed. 3. If you need a refill on your pain medication, please contact your pharmacy. They will contact our office to request authorization.  Prescriptions will not be filled after 5pm or on week-ends. 4. You should follow a light diet the first few days after arrival home, such as soup and crackers, pudding, etc.unless your doctor has advised otherwise. A high-fiber, low fat diet can be resumed as tolerated.   Be sure to include lots of fluids daily. Most patients will experience some swelling and bruising on the chest and neck area.  Ice packs will help.  Swelling and bruising can take several days to resolve 5. Most patients will experience some swelling and bruising in the area of the incision. Ice pack will help. Swelling and bruising can take several days to resolve..  6. It is common to experience some constipation if taking pain medication after surgery.  Increasing fluid intake and taking a stool softener will usually help or prevent this problem from occurring.  A mild laxative (Milk of Magnesia or Miralax) should be taken according to package directions if there are no bowel movements after 48 hours. 7.  You may have steri-strips (small skin tapes) in place directly over the incision.  These strips should be left on the skin for 7-10 days.  If your  surgeon used skin glue on the incision, you may shower in 24 hours.  The glue will flake off over the next 2-3 weeks.  Any sutures or staples will be removed at the office during your follow-up visit. You may find that a light gauze bandage over your incision may keep your staples from being rubbed or pulled. You may shower and replace the bandage daily. 8. ACTIVITIES:  You may resume regular (light) daily activities beginning the next day--such as daily self-care, walking, climbing stairs--gradually increasing activities as tolerated.  You may have sexual intercourse when it is comfortable.  Refrain from any heavy lifting or straining until approved by your doctor. a. You may drive when you no longer are taking prescription pain medication, you can comfortably wear a seatbelt, and you can safely maneuver your car and apply brakes  9. You should see your doctor in the office for a follow-up appointment approximately two weeks after your surgery.  Make sure that you call for this appointment within a day or two after you arrive home to insure a convenient appointment time. 10.   WHEN TO CALL YOUR DOCTOR: 1. Fever over 101.0 2. Inability to urinate 3. Nausea and/or vomiting 4. Extreme swelling or bruising 5. Continued bleeding from incision. 6. Increased pain, redness, or drainage from the incision. 7. Difficulty swallowing or breathing 8. Muscle cramping or spasms. 9. Numbness or tingling in hands or feet or around lips.  The clinic staff is available to answer your questions during regular business hours.  Please don't hesitate to call and ask to speak to one of the nurses if you have concerns.  For further questions, please visit [email or web address]   Soft-Food Eating Plan A soft-food eating plan includes foods that are safe and easy to chew and swallow. Your health care provider or dietitian can help you find foods and flavors that fit into this plan. Follow this plan until your  health care provider or dietitian says it is safe to start eating other foods and food textures. What are tips for following this plan? General guidelines   Take small bites of food, or cut food into pieces about  inch or smaller. Bite-sized pieces of food are easier to chew and swallow.  Eat moist foods. Avoid overly dry foods.  Avoid foods that: ? Are difficult to swallow, such as dry, chunky, crispy, or sticky foods. ? Are difficult to chew, such as hard, tough, or stringy foods. ? Contain nuts, seeds, or fruits.  Follow instructions from your dietitian about the types of liquids that are safe for you to swallow. You may be allowed to have: ? Thick liquids only. This includes only liquids that are thicker than honey. ? Thin and thick liquids. This includes all beverages and foods that become liquid at room temperature.  To make thick liquids: ? Purchase a commercial liquid thickening powder. These are available at grocery stores and pharmacies. ? Mix the thickener into liquids according to instructions on the label. ? Purchase ready-made thickened liquids. ? Thicken soup by pureeing, straining to remove chunks, and adding flour, potato flakes, or corn starch. ? Add commercial thickener to foods that become liquid at room temperature, such as milk shakes, yogurt, ice cream, gelatin, and sherbet.  Ask your health care provider whether you need to take a fiber supplement. Cooking  Cook meats so they stay tender and moist. Use methods like braising, stewing, or baking in liquid.  Cook vegetables and fruit until they are soft enough to be mashed with a fork.  Peel soft, fresh fruits such as peaches, nectarines, and melons.  When making soup, make sure chunks of meat and vegetables are smaller than  inch.  Reheat leftover foods slowly so that a tough crust does not form. What foods are allowed? The items listed below may not be a complete list. Talk with your dietitian about what  dietary choices are best for you. Grains Breads, muffins, pancakes, or waffles moistened with syrup, jelly, or butter. Dry cereals well-moistened with milk. Moist, cooked cereals. Well-cooked pasta and rice. Vegetables All soft-cooked vegetables. Shredded lettuce. Fruits All canned and cooked fruits. Soft, peeled fresh fruits. Strawberries. Dairy Milk. Cream. Yogurt. Cottage cheese. Soft cheese without the rind. Meats and other protein foods Tender, moist ground meat, poultry, or fish. Meat cooked in gravy or sauces. Eggs. Sweets and desserts Ice cream. Milk shakes. Sherbet. Pudding. Fats and oils Butter. Margarine. Olive, canola, sunflower, and grapeseed oil. Smooth salad dressing. Smooth cream cheese. Mayonnaise. Gravy. What foods are not allowed? The items listed bemay not be a complete list. Talk with your dietitian about what dietary choices are best for you. Grains Coarse or dry cereals, such as bran, granola, and shredded wheat. Tough or chewy crusty breads, such as [place] bread or baguettes. Breads with nuts, seeds, or fruit. Vegetables All raw vegetables. Cooked corn. Cooked vegetables that are tough or stringy. Tough, crisp, fried potatoes and potato skins. Fruits Fresh fruits with skins or seeds, or both, such as apples, pears, and grapes.  Stringy, high-pulp fruits, such as papaya, pineapple, coconut, and mango. Fruit leather and all dried fruit. Dairy Yogurt with nuts or coconut. Meats and other protein foods Hard, dry sausages. Dry meat, poultry, or fish. Meats with gristle. Fish with bones. Fried meat or fish. Lunch meat and hotdogs. Nuts and seeds. Chunky peanut butter or other nut butters. Sweets and desserts Cakes or cookies that are very dry or chewy. Desserts with dried fruit, nuts, or coconut. Fried pastries. Very rich pastries. Fats and oils Cream cheese with fruit or nuts. Salad dressings with seeds or chunks. Summary  A soft-food eating plan includes foods that  are safe and easy to swallow. Generally, the foods should be soft enough to be mashed with a fork.  Avoid foods that are dry, hard to chew, crunchy, sticky, stringy, or crispy.  Ask your health care provider whether you need to thicken your liquids and if you need to take a fiber supplement. This information is not intended to replace advice given to you by your health care provider. Make sure you discuss any questions you have with your health care provider. Document Revised: 03/09/2019 Document Reviewed: 01/19/2017 Elsevier Patient Education  [place].

## 2019-12-27 NOTE — Progress Notes (Signed)
[facility] Progress Note  3 Days Post-Op  Subjective: No complaints this AM. Patient reports he is still sore but pain overall is well controlled. Denies nausea. Starting passing flatus, no BM. Tolerating CLD well. Discussed pathology results and patient is understandably disappointed.   Review of Systems  Constitutional: Negative for chills and fever.  Respiratory: Negative for shortness of breath.   Cardiovascular: Negative for chest pain.  Gastrointestinal: Positive for abdominal pain (incisional)  Negative for diarrhea, nausea and vomiting.  Genitourinary: Negative for dysuria and urgency.  All other systems reviewed and are negative.  Objective: Vital signs in last 24 hours: Temp:  [98.9 F (37.2 C)-99.7 F (37.6 C)] 99.1 F (37.3 C) (01/27 0504) Pulse Rate:  [67-74] 67 (01/27 0504) Resp:  [17-20] 18 (01/27 0504) BP: (132-144)/(75-82) 144/82 (01/27 0504) SpO2:  [95 %-98 %] 98 % (01/27 0504) Last BM Date: 12/22/19  Intake/Output from previous day: 01/26 0701 - 01/27 0700 In: 1880.4 [I.V.:1880.4] Out: 900 [Urine:900] Intake/Output this shift: No intake/output data recorded.  PE: General: pleasant, WD, WN white male who is laying in bed in NAD HEENT: head is normocephalic, atraumatic.  Sclera are noninjected.  PERRL.  Ears and nose without any masses or lesions.  Mouth is pink and moist Heart: regular, rate, and rhythm.  Normal s1,s2. No obvious murmurs, gallops, or rubs noted.  Palpable radial and pedal pulses bilaterally Lungs: CTAB, no wheezes, rhonchi, or rales noted.  Respiratory effort nonlabored Abd: soft, appropriately ttp, ND, BS hypoactive, no masses, hernias, or organomegaly, incisions c/d/i MS: all 4 extremities are symmetrical with no cyanosis, clubbing, or edema. Skin: warm and dry with no masses, lesions, or rashes Neuro: Cranial nerves 2-12 grossly intact, speech is normal Psych: A&Ox3 with an appropriate affect.  Lab Results:  Recent  Labs    12/26/19 0449 12/27/19 0439  WBC 15.2* 10.6*  HGB 11.0* 9.7*  HCT 33.9* 30.0*  PLT 157 154   BMET Recent Labs    12/26/19 0449 12/27/19 0439  NA 137 140  K 4.0 3.9  CL 106 108  CO2 23 24  GLUCOSE 140* 121*  BUN 13 11  CREATININE 1.02 0.91  CALCIUM 8.6* 8.5*   PT/INR No results for input(s): LABPROT, INR in the last 72 hours. CMP     Component Value Date/Time   NA 140 12/27/2019 0439   NA 142 11/17/2017 0843   K 3.9 12/27/2019 0439   K 4.0 11/17/2017 0843   CL 108 12/27/2019 0439   CO2 24 12/27/2019 0439   CO2 28 11/17/2017 0843   GLUCOSE 121 (H) 12/27/2019 0439   GLUCOSE 118 11/17/2017 0843   BUN 11 12/27/2019 0439   BUN 17.5 11/17/2017 0843   CREATININE 0.91 12/27/2019 0439   CREATININE 1.3 11/17/2017 0843   CALCIUM 8.5 (L) 12/27/2019 0439   CALCIUM 9.5 11/17/2017 0843   PROT 7.1 12/22/2019 1525   PROT 6.9 11/17/2017 0843   ALBUMIN 4.1 12/22/2019 1525   ALBUMIN 4.3 11/17/2017 0843   AST 29 12/22/2019 1525   AST 23 11/17/2017 0843   ALT 34 12/22/2019 1525   ALT 15 11/17/2017 0843   ALKPHOS 72 12/22/2019 1525   ALKPHOS 79 11/17/2017 0843   BILITOT 0.9 12/22/2019 1525   BILITOT 1.05 11/17/2017 0843   GFRNONAA >60 12/27/2019 0439   GFRAA >60 12/27/2019 0439   Lipase     Component Value Date/Time   LIPASE 25 11/18/2019 0649       Studies/Results: No results  found.  Anti-infectives: Anti-infectives (From admission, onward)   Start     Dose/Rate Route Frequency Ordered Stop   12/24/19 0600  ceFAZolin (ANCEF) IVPB 2g/100 mL premix     2 g 200 mL/hr over 30 Minutes Intravenous On call to O.R. 12/23/19 0932 12/24/19 0754       Assessment/Plan Hx of colon cancer s/p resection and colostomy reversal  SBO S/p laparoscopic LOA and small bowel resection 12/24/19 Dr. Marcello Moores - POD#3 - +flatus and tolerating CLD - advance to DYS 1 diet and then as tolerated to soft diet - continue to mobilize - pathology shows adenocarcinoma - I notified  patient of results and have also spoken with Dr. Burr Medico  - patient may be ready for discharge in the next 24-48 hrs if having more bowel function   FEN: dys 1 diet and advance as tolerated to SOFT VTE: SCDs, lovenox ID: [id number]  LOS: 5 days    Brigid Re , [facility] 12/27/2019, 10:50 AM Please see Amion for pager number during day hours 7:00am-4:30pm

## 2019-12-27 NOTE — Progress Notes (Addendum)
Physical Therapy Treatment Patient Details Name: James Doyle MRN: [id number] DOB: 25-Feb-1946 Today's Date: 12/27/2019    History of Present Illness 74 yo male admitted with SBO. S/P resection, lysis of adhesions 1/24. Hx of R TKA, colostomy reversal 2019    PT Comments    Pt assisted with ambulating in hallway and steady with gait while pushing IV pole.  Pt encouraged to ambulate at least 3x more today.  Pt states he will ambulate later with his wife.  Pt also stated he was informed of his diagnosis this morning and does not wish for his wife to know about it yet (does not want to cause her more worry).  Pt hopeful for d/c home tomorrow.    Follow Up Recommendations  No PT follow up     Equipment Recommendations  None recommended by PT    Recommendations for Other Services       Precautions / Restrictions Precautions Precautions: Fall    Mobility  Bed Mobility Overal bed mobility: Needs Assistance Bed Mobility: Supine to Sit;Sit to Supine     Supine to sit: Supervision Sit to supine: Supervision   General bed mobility comments: increased time due to sore abdomen  Transfers Overall transfer level: Needs assistance Equipment used: None Transfers: Sit to/from Stand Sit to Stand: Min guard            Ambulation/Gait Ambulation/Gait assistance: Min guard Gait Distance (Feet): 360 Feet Assistive device: IV Pole Gait Pattern/deviations: Decreased stride length;Step-through pattern     General Gait Details: pt pushed IV pole, no overt LOB, no symptoms reported   Stairs             Wheelchair Mobility    Modified Rankin (Stroke Patients Only)       Balance                                            Cognition Arousal/Alertness: Awake/alert Behavior During Therapy: WFL for tasks assessed/performed Overall Cognitive Status: Within Functional Limits for tasks assessed                                         Exercises      General Comments        Pertinent Vitals/Pain Pain Assessment: 0-10 Pain Score: 4  Pain Location: abdomen Pain Descriptors / Indicators: Sore Pain Intervention(s): Monitored during session;Repositioned    Home Living                      Prior Function            PT Goals (current goals can now be found in the care plan section) Progress towards PT goals: Progressing toward goals    Frequency    Min 3X/week      PT Plan Current plan remains appropriate    Co-evaluation              AM-PAC PT "6 Clicks" Mobility   Outcome Measure  Help needed turning from your back to your side while in a flat bed without using bedrails?: A Little Help needed moving from lying on your back to sitting on the side of a flat bed without using bedrails?: A Little Help needed moving to and from a bed  to a chair (including a wheelchair)?: A Little Help needed standing up from a chair using your arms (e.g., wheelchair or bedside chair)?: A Little Help needed to walk in hospital room?: A Little Help needed climbing 3-5 steps with a railing? : A Little 6 Click Score: 18    End of Session   Activity Tolerance: Patient tolerated treatment well Patient left: in bed;with call bell/phone within reach Nurse Communication: Mobility status PT Visit Diagnosis: Difficulty in walking, not elsewhere classified (R26.2)     Time: [id number] PT Time Calculation (min) (ACUTE ONLY): 20 min  Charges:  $Gait Training: 8-22 mins                     Arlyce Dice, DPT Acute Rehabilitation Services Office: [phone]   York Ram E 12/27/2019, 1:32 PM

## 2019-12-27 NOTE — Progress Notes (Signed)
James Doyle   DOB:[birth year]   [id number]   [id number]  Oncology follow up   Subjective: Patient is well-known to me, has been under my care for his colon cancer.  He was admitted for small bowel obstruction, status post surgery, which unfortunately showed a mesentery metastasis.  He is recovering well from surgery, had a bowel movement today, incision pain is tolerable.  No other complaints.   Objective:  Vitals:   12/27/19 0504 12/27/19 1337  BP: (!) 144/82 (!) 123/57  Pulse: 67 71  Resp: 18 18  Temp: 99.1 F (37.3 C) 97.7 F (36.5 C)  SpO2: 98% 96%    Body mass index is 30.25 kg/m.  Intake/Output Summary (Last 24 hours) at 12/27/2019 1529 Last data filed at 12/27/2019 1031 Gross per 24 hour  Intake 2120.36 ml  Output 500 ml  Net 1620.36 ml     Sclerae unicteric  Oropharynx clear  No peripheral adenopathy  Lungs clear -- no rales or rhonchi  Heart regular rate and rhythm  Abdomen benign, soft, midline incision is covered, clean   MSK no focal spinal tenderness, no peripheral edema  Neuro nonfocal    CBG (last 3)  No results for input(s): GLUCAP in the last 72 hours.   Labs:  Urine Studies No results for input(s): UHGB, CRYS in the last 72 hours.  Invalid input(s): UACOL, UAPR, USPG, UPH, UTP, UGL, UKET, UBIL, UNIT, UROB, [place], UEPI, UWBC, [place], [place], [place], [place], [place]  Basic Metabolic Panel: Recent Labs  Lab 12/22/19 1525 12/22/19 1525 12/25/19 0756 12/25/19 0756 12/26/19 0449 12/27/19 0439  NA 139  --  137  --  137 140  K 3.6   < > 4.4   < > 4.0 3.9  CL 103  --  106  --  106 108  CO2 27  --  25  --  23 24  GLUCOSE 108*  --  140*  --  140* 121*  BUN 14  --  16  --  13 11  CREATININE 1.12  --  1.26*  --  1.02 0.91  CALCIUM 9.3  --  8.9  --  8.6* 8.5*   < > = values in this interval not displayed.   GFR Estimated Creatinine Clearance: 88.8 mL/min (by C-G formula based on SCr of 0.91 mg/dL). Liver Function Tests: Recent Labs  Lab  12/22/19 1525  AST 29  ALT 34  ALKPHOS 72  BILITOT 0.9  PROT 7.1  ALBUMIN 4.1   No results for input(s): LIPASE, AMYLASE in the last 168 hours. No results for input(s): AMMONIA in the last 168 hours. Coagulation profile No results for input(s): INR, PROTIME in the last 168 hours.  CBC: Recent Labs  Lab 12/22/19 1525 12/25/19 0756 12/26/19 0449 12/27/19 0439  WBC 10.5 23.7* 15.2* 10.6*  HGB 15.0 12.7* 11.0* 9.7*  HCT 47.0 39.4 33.9* 30.0*  MCV 89.9 90.2 89.9 91.5  PLT 184 195 157 154   Cardiac Enzymes: No results for input(s): CKTOTAL, CKMB, CKMBINDEX, TROPONINI in the last 168 hours. BNP: Invalid input(s): POCBNP CBG: No results for input(s): GLUCAP in the last 168 hours. D-Dimer No results for input(s): DDIMER in the last 72 hours. Hgb A1c No results for input(s): HGBA1C in the last 72 hours. Lipid Profile No results for input(s): CHOL, HDL, LDLCALC, TRIG, CHOLHDL, LDLDIRECT in the last 72 hours. Thyroid function studies No results for input(s): TSH, T4TOTAL, T3FREE, THYROIDAB in the last 72 hours.  Invalid input(s): FREET3 Anemia  work up No results for input(s): VITAMINB12, FOLATE, FERRITIN, TIBC, IRON, RETICCTPCT in the last 72 hours. Microbiology Recent Results (from the past 240 hour(s))  SARS CORONAVIRUS 2 (TAT 6-24 HRS) Nasopharyngeal Nasopharyngeal Swab     Status: None   Collection Time: 12/22/19  5:13 PM   Specimen: Nasopharyngeal Swab  Result Value Ref Range Status   SARS Coronavirus 2 NEGATIVE NEGATIVE Final    Comment: (NOTE) SARS-CoV-2 target nucleic acids are NOT DETECTED. The SARS-CoV-2 RNA is generally detectable in upper and lower respiratory specimens during the acute phase of infection. Negative results do not preclude SARS-CoV-2 infection, do not rule out co-infections with other pathogens, and should not be used as the sole basis for treatment or other patient management decisions. Negative results must be combined with clinical  observations, patient history, and epidemiological information. The expected result is Negative. Fact Sheet for Patients: SugarRoll.be Fact Sheet for Healthcare Providers: https://www.woods-mathews.com/ This test is not yet approved or cleared by the [place] FDA and  has been authorized for detection and/or diagnosis of SARS-CoV-2 by FDA under an Emergency Use Authorization (EUA). This EUA will remain  in effect (meaning this test can be used) for the duration of the COVID-19 declaration under Section 56 4(b)(1) of the Act, 21 U.S.C. section 360bbb-3(b)(1), unless the authorization is terminated or revoked sooner. Performed at [facility], [place] [address], [place], [place] [address]   Surgical PCR screen     Status: Abnormal   Collection Time: 12/23/19  8:22 AM   Specimen: Nasal Mucosa; Nasal Swab  Result Value Ref Range Status   MRSA, PCR NEGATIVE NEGATIVE Final   Staphylococcus aureus POSITIVE (A) NEGATIVE Final    Comment: (NOTE) The Xpert SA Assay (FDA approved for NASAL specimens in patients 36 years of age and older), is one component of a comprehensive surveillance program. It is not intended to diagnose infection nor to guide or monitor treatment. Performed at [facility], [place] [address], [place], [place] [address]       Studies:  No results found.  Assessment: 74 y.o. male with   1. SBO secondary to metastatic tumor to mesentery and small bowel resection on 1/24 2.  History of stage III left colon cancer, status post hemicolectomy and adjuvant Xeloda 3. Anemia secondary to surgery     Plan:  -I reviewed his surgical pathology findings with patient, his wife and his daughter in detail.  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  I discussed with Dr. Marcello Moores, no more surgery  can be offered.  We will discuss in GI tumor board next week, to see if he is a candidate for adjuvant radiation.  -I plan to have a staging PET scan in about 3-4 weeks -I recommend chemotherapy FOLFOX for 6 months if PET scan shows no other distant metastasis, or in definitively if he has more metastatic disease.  He previously refused oxaliplatin, due to concern of side effects.  He is more open to chemo at this point, due to the high risk of recurrent disease. Will see how he recovers from surgery, and determine if he is a candidate for FOLFOX, since he is already 74 year old.  -I encouraged him to focus on recovery from surgery, eat more, and participate physical therapy, gaining his strength back, to be ready for chemotherapy. -I will see him back in my  office in 2 weeks -he may need a port, plan to put in before chemo   Truitt Merle, MD 12/27/2019  3:29 PM

## 2019-12-28 ENCOUNTER — Other Ambulatory Visit: Payer: Medicare Other

## 2019-12-28 LAB — CBC
HCT: 30.1 % — ABNORMAL LOW (ref 39.0–52.0)
Hemoglobin: 9.5 g/dL — ABNORMAL LOW (ref 13.0–17.0)
MCH: 28.9 pg (ref 26.0–34.0)
MCHC: 31.6 g/dL (ref 30.0–36.0)
MCV: 91.5 fL (ref 80.0–100.0)
Platelets: 175 10*3/uL (ref 150–400)
RBC: 3.29 MIL/uL — ABNORMAL LOW (ref 4.22–5.81)
RDW: 12.8 % (ref 11.5–15.5)
WBC: 8.5 10*3/uL (ref 4.0–10.5)
nRBC: 0 % (ref 0.0–0.2)

## 2019-12-28 LAB — BASIC METABOLIC PANEL
Anion gap: 7 (ref 5–15)
BUN: 10 mg/dL (ref 8–23)
CO2: 23 mmol/L (ref 22–32)
Calcium: 8.6 mg/dL — ABNORMAL LOW (ref 8.9–10.3)
Chloride: 107 mmol/L (ref 98–111)
Creatinine, Ser: 0.98 mg/dL (ref 0.61–1.24)
GFR calc Af Amer: >60 mL/min (ref >60)
GFR calc non Af Amer: >60 mL/min (ref >60)
Glucose, Bld: 103 mg/dL — ABNORMAL HIGH (ref 70–99)
Potassium: 3.8 mmol/L (ref 3.5–5.1)
Sodium: 137 mmol/L (ref 135–145)

## 2019-12-28 MED ORDER — HYDROCODONE-ACETAMINOPHEN 5-325 MG PO TABS: 1.0000 | ORAL_TABLET | ORAL | 0 refills | Status: DC | PRN

## 2019-12-28 MED ORDER — POLYETHYLENE GLYCOL 3350 17 G PO PACK: 17.0000 g | PACK | Freq: Every day | ORAL | Status: DC | PRN

## 2019-12-28 NOTE — Discharge Summary (Signed)
[facility] Discharge Summary   Patient ID: James Doyle MRN: [id number] DOB/AGE: [birth year] 76 y.o.  Admit date: 12/22/2019 Discharge date: 12/28/2019  Admitting Diagnosis: SBO  Discharge Diagnosis Patient Active Problem List   Diagnosis Date Noted  . 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 SBO (small bowel obstruction) ([place]) 12/22/2019  . Abdominal distention 11/18/2019  . Essential hypertension 11/18/2019  . Hyperbilirubinemia 11/18/2019  . Leukocytosis 11/18/2019  . Hyperglycemia 11/18/2019  . 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 Status post end colostomy takedown laparoscopically 02/09/2018 06/12/2017  . Cancer of left colon pT3, pN1b, s/p colectomy 2018 06/08/2017  . OA (osteoarthritis) of knee 07/27/2016    Consultants Oncology  Imaging: No results found.  Procedures Dr. Marcello Moores (12/24/19) - laparoscopic LOA with small bowel resection  Hospital Course:  Patient is a 74 year old male who presented to [facility] from home with sbo.   Patient was admitted and initially treated conservatively with NGT. Patient failed to improve with conservative management and underwent procedure listed above. Tolerated procedure well and was transferred to the floor.  Diet was advanced as tolerated.  On POD#4, the patient was voiding well, tolerating diet, ambulating well, pain well controlled, vital signs stable, incisions c/d/i and felt stable for discharge home.  Patient will follow up in our office in 3-4 weeks and knows to call with questions or concerns.  He will call to confirm appointment date/time.    Physical Exam: General: pleasant, WD, WN white male who is laying in bed in NAD HEENT: head is normocephalic, atraumatic. Sclera are noninjected. PERRL. Ears and nose without any masses or lesions. Mouth is pink and moist Heart: regular, rate, and rhythm. Normal s1,s2. No obvious murmurs,  gallops, or rubs noted. Palpable radial and pedal pulses bilaterally Lungs: CTAB, no wheezes, rhonchi, or rales noted. Respiratory effort nonlabored Abd: soft,appropriately ttp, ND,BS hypoactive, no masses, hernias, or organomegaly, incisions c/d/i MS: all 4 extremities are symmetrical with no cyanosis, clubbing, or edema. Skin: warm and dry with no masses, lesions, or rashes Neuro: Cranial nerves 2-12 grossly intact, speech is normal Psych: A&Ox3 with an appropriate affect.  I have personally looked this patient up in the [place] Controlled Substance Database and reviewed their medications.  Allergies as of 12/28/2019   No Known Allergies     Medication List    STOP taking these medications   lactulose 10 GM/15ML solution Commonly known as: [place] these medications   brimonidine 0.2 % ophthalmic solution Commonly known as: ALPHAGAN Place 1 drop into both eyes 2 (two) times daily.   dorzolamide-timolol 22.3-6.8 MG/ML ophthalmic solution Commonly known as: COSOPT Place 1 drop into both eyes daily.   HYDROcodone-acetaminophen 5-325 MG tablet Commonly known as: NORCO/VICODIN Take 1-2 tablets by mouth every 4 (four) hours as needed for moderate pain.   ondansetron 4 MG tablet Commonly known as: ZOFRAN Take 1 tablet (4 mg total) by mouth every 6 (six) hours as needed for nausea.   polyethylene glycol 17 g packet Commonly known as: MIRALAX / GLYCOLAX Take 17 g by mouth daily as needed for mild constipation. Hold for diarrhea What changed:   when to take this  reasons to take this        Follow-up Information    Leighton Ruff, MD. Go on [id number].   Specialty: General Surgery Why: Follow up appointment scheduled for 11:00 AM. Please arrive 30 min prior to  appointment time. Bring photo ID and insurance information.  Contact information: [address] [place] [address] [phone]        Truitt Merle, MD Follow up.   Specialties:  Hematology, Oncology Why: Follow up in 2 weeks Contact information: [place] [address] [phone]           Signed: Brigid Re, [facility] 12/28/2019, 9:15 AM Please see Amion for pager number during day hours 7:00am-4:30pm

## 2019-12-28 NOTE — Care Management Important Message (Signed)
Important Message  Patient Details IM Letter given to [name] SW Case Manager to present to the Patient Name: James Doyle MRN: [id number] Date of Birth: [birth year]   Medicare Important Message Given:  Yes     [name] 12/28/2019, 10:09 AM

## 2019-12-28 NOTE — Progress Notes (Signed)
Patient given discharge instructions, and verbalized an understanding of all discharge instructions.  Patient agrees with discharge plan, and is being discharged in stable medical condition.  Patient given transportation via wheelchair. 

## 2019-12-29 ENCOUNTER — Encounter: Payer: Self-pay | Admitting: Hematology

## 2020-01-01 ENCOUNTER — Encounter: Payer: Self-pay | Admitting: Hematology

## 2020-01-01 ENCOUNTER — Telehealth: Payer: Self-pay | Admitting: Nurse Practitioner

## 2020-01-01 NOTE — Telephone Encounter (Signed)
Scheduled appt per 12/7 sch message - unable to reach pt . Left message with appt date and time   

## 2020-01-02 ENCOUNTER — Telehealth: Payer: Self-pay | Admitting: Hematology

## 2020-01-02 NOTE — Telephone Encounter (Signed)
Faxed records to [facility]  [phone]

## 2020-01-09 NOTE — Progress Notes (Addendum)
[place]   [phone] [phone]   Clinic Follow up Note   Patient Care Team: Alroy Dust, L.Marlou Sa, MD as PCP - General (Family Medicine) Leighton Ruff, MD as Consulting Physician (General Surgery) Truitt Merle, MD as Consulting Physician (Hematology) Wonda Horner, MD as Consulting Physician (Gastroenterology) 01/10/2020  CHIEF COMPLAINT: F/u colon cancer   SUMMARY OF ONCOLOGIC HISTORY: Oncology History Overview Note  Cancer Staging Cancer of left colon [facility]) Staging form: Colon and Rectum, AJCC 8th Edition - Pathologic stage from 06/09/2017: Stage IIIB (pT3, pN1b, cM0) - Signed by Truitt Merle, MD on 07/06/2017     Cancer of left colon pT3, pN1b, s/p colectomy 2018  06/08/2017 Initial Diagnosis   Cancer of left colon ([place])   06/08/2017 Imaging   CT AP W Contrast 06/08/17 IMPRESSION: 1. Mass of the transverse colon near splenic flexure measuring up to 4 cm compatible with colonic adenocarcinoma. No lymphadenopathy by imaging criteria. 2. Proximal dilatation of ascending and transverse colon with mild inflammatory changes likely due to obstruction. 3. 4 mm right middle lobe pulmonary nodule, attention on follow-up recommended. 4. 2 mm right kidney nonobstructing nephrolithiasis.   06/09/2017 Surgery   LAPAROSCOPY DIAGNOSTIC, OPEN LEFT COLECTOMY by Dr. Marcello Moores   06/09/2017 Pathology Results   Diagnosis 06/09/17 Colon, segmental resection for tumor, left - INVASIVE ADENOCARCINOMA, WELL DIFFERENTIATED, SPANNING 5 CM. - TUMOR INVADES THROUGH MUSCULARIS PROPRIA INTO SUBSEROSAL TISSUE. - RESECTION MARGINS ARE NEGATIVE. - METASTATIC CARCINOMA IN TWO OF TWENTY-SEVEN LYMPH NODES (2/27). - SEE ONCOLOGY TABLE.    07/20/2017 Imaging   CT Chest W Contrast IMPRESSION: No evidence of metastatic disease or other acute findings within the thorax.   08/05/2017 - 12/09/2017 Chemotherapy   Xeloda 253m in am and 20048mpm, 2 weeks on and 1 week off, starting  on 08/05/17 and plan for 6 months   Reduced to 2000 mg (4 tablets) in AM, 1500 mg (3 tablets) in PM with cycle 3, cycle 3 has been held since 08/1017 due to GI toxicity and active infection.    12/27/2017 Imaging   CT AP W Contrast 12/27/17 IMPRESSION: 1. Interval partial colectomy with transverse colostomy and long Hartmann's pouch. 2. No evidence of recurrent or metastatic disease. 3. Too small to characterize left hepatic lobe lesion, similar to on the prior exam. Ongoing stability favors a benign lesion such as a tiny cyst. This could be re-evaluated at follow-up. 4. Left renal lesion which measures greater than fluid density but is decreased in size compared to the prior. This favors a complex cyst but is technically indeterminate. Recommend attention on follow-up. 5.  Aortic Atherosclerosis (ICD10-I70.0). 6. Right nephrolithiasis.   02/09/2018 Surgery   Surgery 02/09/2018: LAPAROSCOPIC COLOSTOMY REVERSAL ERAS PATHWAY with ThLeighton RuffMD   06/30/53/0086maging   IMPRESSION: 1. Interval reversal of the patient's colostomy with reanastomosis. No complicating features. 2. No findings for residual or recurrent tumor, locoregional lymphadenopathy or metastatic disease elsewhere. 3. Stable 5 mm left hepatic lobe lesion, likely benign cysts. 4. Stable 22 mm lesion projecting off the anterior aspect of the left kidney. This is likely a complex cyst but recommend a precontrast sequence of the abdomen when the patient has is next follow-up CT scan. 5. Stable enlarged prostate gland.   07/17/2019 Imaging   Surveillance CT CAP IMPRESSION: 1. Stable exam. No new or progressive interval findings to suggest recurrent or metastatic disease in the chest, abdomen, or pelvis. 2. No change in the tiny hypodensity in the dome of  the left liver compatible with benign etiology. 3. 2.0 cm exophytic lesion in the left kidney measures stable to minimally smaller in the interval. As noted previously,  this is probably a cyst complicated by proteinaceous debris or hemorrhage. Continued attention on follow-up recommended. 4. Prostatomegaly. 5.  Aortic Atherosclerois (ICD10-170.0)   11/18/2019 Imaging   CT AP IMPRESSION: 1. Multiple loops of mildly dilated and fluid-filled small bowel in the left hemidiaphragm with associated mild mesenteric edema, interloop fluid and a small amount of ascites extending upward into the perisplenic/subdiaphragmatic space. No transition point is evident. Differential considerations include infectious enteritis, partial small bowel obstruction, or intermittent small bowel obstruction. 2. Surgical changes of prior sigmoid colectomy. 3. Punctate nonobstructing nephrolithiasis in the interpolar right kidney. 4. Large bilateral renal cysts.   12/21/2019 Imaging   CT AP IMPRESSION: 1. Small-bowel obstruction with transition in the mid abdomen, likely secondary to adhesion. 2. Mildly edematous small bowel wall with stranding of the adjacent mesentery and small amount of free fluid likely reactive to obstruction. Mild enteritis is not excluded. 3. Mildly distended colon proximal to the sigmoid anastomosis. A degree of obstruction or mild narrowing at the level of the anastomosis is not excluded. 4. Additional nonacute findings as above.   12/24/2019 Surgery   POST-OPERATIVE DIAGNOSIS:  small bowel obstruction PROCEDURE:  Procedure(s): LAPAROSCOPY DIAGNOSTIC lysis of adhesions small bowel resection   Surgeon(s): Leighton Ruff, MD   05/15/736 Pathology Results   Surgical Pathology Report  Clinical History: small bowel obstruction  FINAL MICROSCOPIC DIAGNOSIS:  A. SMALL BOWEL, PARTIAL COLECTOMY:  -  Adenocarcinoma, moderately differentiated, 2.5 cm  -  One tumor deposit (0.7 cm)  -  Radial margin involved by carcinoma  -  See comment below   COMMENT:  Given the patient's history of left-sided colon cancer, this likely  represents a metastasis  rather than a primary small bowel neoplasm. Dr.  Vic Ripper reviewed the case and agrees with the above diagnosis.  Dr.  Marcello Moores was notified of these results on December 26, 2019. Ancillary  studies are available upon request.      INTERVAL HISTORY: Mr. Weick returns for hospital f/u as scheduled. He was recently admitted for SBO s/p resection on 12/24/19. Post operative course was uneventful. He was discharged on 12/28/19. He was sore for about 10 days with low mobility, this week he is recovering more, increased mobility and improved appetite. Still feels fatigued, but up at home. Once he gets out of bed at 8 am he stays up, in and out of recliner at home, moves about. Has used stationary bike a few times. Having small calibre stools once daily with miralax. Denies much abdominal pain, no pain meds. Denies n/v. No recent fever, chills, cough, chest pain, dyspnea, leg edema, or neuropathy.    MEDICAL HISTORY:  Past Medical History:  Diagnosis Date  . Aortic atherosclerosis ([place]) 12/27/2017   Noted on CT abd/pelvis  . Arthritis   . Cancer of left colon ([place]) 06/08/2017  . Cellulitis of left hand   . Chronic back pain   . Chronic pain of right knee   . Chronic right hip pain   . 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 DDD (degenerative disc disease), lumbar   . H/O vocal cord paralysis    left   . History of hypokalemia   . History of thumb fracture    Left  . Left nephrolithiasis 12/27/2017   Noted on CT abd/pelvis    SURGICAL HISTORY: Past  Surgical History:  Procedure Laterality Date  . BACK SURGERY     times 2  . COLON RESECTION    . COLONOSCOPY    . COLOSTOMY    . COLOSTOMY TAKEDOWN N/A 02/09/2018   Procedure: LAPAROSCOPIC COLOSTOMY REVERSAL ERAS PATHWAY;  Surgeon: Leighton Ruff, MD;  Location: [facility];  Service: General;  Laterality: N/A;  . INNER EAR SURGERY     bilat  . LAPAROSCOPY N/A 06/09/2017   Procedure: LAPAROSCOPY DIAGNOSTIC, OPEN  LEFT  COLECTOMY;  Surgeon: Leighton Ruff, MD;  Location: [facility];  Service: General;  Laterality: N/A;  . LAPAROSCOPY N/A 12/24/2019   Procedure: LAPAROSCOPY DIAGNOSTIC lysis of adhesions;  Surgeon: Leighton Ruff, MD;  Location: [facility];  Service: General;  Laterality: N/A;  . LAPAROTOMY N/A 12/24/2019   Procedure: small bowel resection;  Surgeon: Leighton Ruff, MD;  Location: [facility];  Service: General;  Laterality: N/A;  . PROSTATE BIOPSY    . TONSILLECTOMY    . TOTAL KNEE ARTHROPLASTY Right 07/27/2016   Procedure: RIGHT TOTAL KNEE ARTHROPLASTY;  Surgeon: Gaynelle Arabian, MD;  Location: [facility];  Service: Orthopedics;  Laterality: Right;    I have reviewed the social history and family history with the patient and they are unchanged from previous note.  ALLERGIES:  has No Known Allergies.  MEDICATIONS:  Current Outpatient Medications  Medication Sig Dispense Refill  . brimonidine (ALPHAGAN) 0.2 % ophthalmic solution Place 1 drop into both eyes 2 (two) times daily.     . polyethylene glycol (MIRALAX / GLYCOLAX) 17 g packet Take 17 g by mouth daily as needed for mild constipation. Hold for diarrhea    . dorzolamide-timolol (COSOPT) 22.3-6.8 MG/ML ophthalmic solution Place 1 drop into both eyes daily.    Marland Kitchen HYDROcodone-acetaminophen (NORCO/VICODIN) 5-325 MG tablet Take 1-2 tablets by mouth every 4 (four) hours as needed for moderate pain. (Patient not taking: Reported on 01/10/2020) 30 tablet 0  . ondansetron (ZOFRAN) 4 MG tablet Take 1 tablet (4 mg total) by mouth every 6 (six) hours as needed for nausea. (Patient not taking: Reported on 01/10/2020) 20 tablet 0   No current facility-administered medications for this visit.    PHYSICAL EXAMINATION: ECOG PERFORMANCE STATUS: 1 - Symptomatic but completely ambulatory  Vitals:   01/10/20 1315 01/10/20 1318  BP: (!) 155/81 134/89  Pulse: (!) 59 61  Resp: 17   Temp: 99.1 F (37.3 C)   SpO2: 100%    Filed Weights   01/10/20 1315  Weight: 223 lb 6.4  oz (101.3 kg)    GENERAL:alert, no distress and comfortable SKIN: no rash EYES:  sclera clear NECK: without mass  LUNGS: clear with normal breathing effort HEART: regular rate & rhythm, no lower extremity edema ABDOMEN: abdomen soft, non-tender and normal bowel sounds. Surgical incisions healing well, no erythema or drainage. Large hematoma to left abdomen see image below  NEURO: alert & oriented x 3 with fluent speech, no focal motor/sensory deficits     LABORATORY DATA:  I have reviewed the data as listed CBC Latest Ref Rng & Units 01/10/2020 12/28/2019 12/27/2019  WBC 4.0 - 10.5 K/uL 10.1 8.5 10.6(H)  Hemoglobin 13.0 - 17.0 g/dL 12.7(L) 9.5(L) 9.7(L)  Hematocrit 39.0 - 52.0 % 39.6 30.1(L) 30.0(L)  Platelets 150 - 400 K/uL 278 175 154     CMP Latest Ref Rng & Units 01/10/2020 12/28/2019 12/27/2019  Glucose 70 - 99 mg/dL 97 103(H) 121(H)  BUN 8 - 23 mg/dL 13 10 11   Creatinine 0.61 -  1.24 mg/dL 1.01 0.98 0.91  Sodium 135 - 145 mmol/L 142 137 140  Potassium 3.5 - 5.1 mmol/L 4.8 3.8 3.9  Chloride 98 - 111 mmol/L 106 107 108  CO2 22 - 32 mmol/L 28 23 24   Calcium 8.9 - 10.3 mg/dL 9.4 8.6(L) 8.5(L)  Total Protein 6.5 - 8.1 g/dL 7.3 - -  Total Bilirubin 0.3 - 1.2 mg/dL 0.9 - -  Alkaline Phos 38 - 126 U/L 91 - -  AST 15 - 41 U/L 19 - -  ALT 0 - 44 U/L 20 - -      RADIOGRAPHIC STUDIES: I have personally reviewed the radiological images as listed and agreed with the findings in the report. No results found.   ASSESSMENT & PLAN: KOLETON DUCHEMIN is a 74 y.o. male with   1. Left colon cancer, invasive adenocarcinoma, G1, pT3N1bM0, stage IIIB, MSI-stable -Diagnosed in 05/2017. Treated withhemicolectomy and adjuvantXeloda. Currently on surveillance. -per pthe had colonoscopy in 2019, by Dr. Penelope Coop. Last CT in 07/2019 was negative for recurrence  -he had been doing well on surveillance until 8-10/2019 when he developed worsening constipation and weight loss. He ultimately presented to  ED and found to have SBO.  -He underwent SB resection and lysis of adhesions by Dr. Marcello Moores on 12/24/19. Path showed moderately differentiated adenocarcinoma spanning 2.5 cm, with one tumor deposit in the mesentery. Negative LNs. However the radial margin was positive for tumor cells.  -Mr. Broers is recovering well from surgery, bowels moving, appetite and energy also improving. He has a large hematoma on left abdomen, otherwise not much abd pain. Has f/u with Dr. Marcello Moores on 2/22 -we reviewed the imaging and path with him and his daughter Lynelle Smoke in great detail, also reviewed previous surveillance scans and labs from 2019-2020. They had many questions to which we answered to the best of our ability.  -His case was reviewed in GI tumor board; Dr. Marcello Moores is not recommending further surgery. He may be a candidate for adjuvant radiation given the positive radial margin, however this may be difficult to target given the location in the mesentery. The patient and family will think about whether they want a referral to Dr. Lisbeth Renshaw -Dr. Burr Medico is recommending staging PET, he is scheduled 2/17, to r/o residual or metastatic disease. If PET is negative, Dr. Burr Medico is recommending 6 months of adjuvant FOLFOX to reduce his high recurrence risk. If PET is positive for distant metastasis, we may pursue biopsy for tissue confirmation. If he is found to have metastatic disease, Dr. Burr Medico would likely still offer systemic FOLFOX chemo but in the palliative setting.  -Chemotherapy consent: Side effects including but not limited to fatigue, nausea, vomiting, diarrhea, hair loss, neuropathy, cold sensitivity, mucositis, fluid retention, renal and kidney dysfunction, neutropenic fever, needed for blood transfusion, bleeding, were discussed with patient in great detail. He agrees to proceed. He initially declined Oxaliplatin due to toxicities, but he is willing to proceed now. Due to his advanced age, chemo may be difficult for him -he  agrees to [facility] placement, either by IR or Dr. Marcello Moores, the daughter plans to research IR doctors and let us know by 2/12 where he will have PAC placed  -In the meantime, we encouraged him to focus on recovery, nutrition, strength, and mobility.  -he will have phone f/u after PET scan, then return for f/u and first cycle FOLFOX in 2 weeks -His records have been sent to [facility] Dr. Zackery Barefoot for second opinion.   2.Occasional  diarrhea, Mid abdominal pain, constipation -he alternated between diarrhea and constipation after initial colon cancer surgery -since f/u in 07/2019 to December he had more constipation and intermittent abdominal pain -he was found to have SBO and underwent another surgery -since surgery on 1/24, he is having small calibre stools, on miralax daily; no significant abdominal pain   3. Periodically elevated Cr,multiplekidney cysts -On CT CAP he hasmultiple kidney cysts, likely benign.   4. Elevated BP  -He has had elevated BP in clinic the last 2 visits in 7 months.  -His PCP recently started him on Lisinopril.  -BP 134/89 today   5. Osteoarthritis -f/u with PCP   PLAN: -Labs, extensive review of previous imaging, path reviewed  -PET on 2/17 -Phone f/u 2/18 -lab, f/u, Anticipate starting FOLFOX in 2 weeks   No problem-specific Assessment & Plan notes found for this encounter.   Orders Placed This Encounter  Procedures  . NM PET Image Initial (PI) Skull Base To Thigh    Standing Status:   Future    Standing Expiration Date:   01/09/2021    Order Specific Question:   ** REASON FOR EXAM (FREE TEXT)    Answer:   SBO s/o resection and colon cancer recurrence, mesenteric deposit. r/o metastatic disease    Order Specific Question:   If indicated for the ordered procedure, I authorize the administration of a radiopharmaceutical per Radiology protocol    Answer:   Yes    Order Specific Question:   Preferred imaging location?    Answer:   [facility]     Order Specific Question:   Radiology Contrast Protocol - do NOT remove file path    Answer:   [email or web address]   All questions were answered. The patient knows to call the clinic with any problems, questions or concerns. No barriers to learning was detected. I spent 45 minutes counseling the patient face to face. The total time spent in the appointment was 60 minute, and more than 50% was on counseling and review of test results     Alla Feeling, NP 01/10/20   Addendum  I have seen the patient, examined him. I agree with the assessment and and plan and have edited the notes.   Mr. Lebow is recovering form surgery well. I reviewed his previous scan, recent surgical path and our GI tumor board discussion with patient and his daughter in great details.  We think this is likely mesentery recurrence of previous colon cancer, which was removed by surgery, unfortunately mesentery margin was positive.  We discussed the role of adjuvant radiation, patient wants to think about it and make the final decision later.  I recommend adjuvant chemotherapy with FOLFOX for 6 months, with the goal of curative, he is agreeable.  Potential benefit and side effects discussed with him in details, he agrees to proceed.  Chemo consent was obtained.  We discussed the extremely high risk of recurrence down the road, will continue close follow-up with scan every 3 to 6 months for the next few years.   Patient's daughter had many questions about his cancer recurrence, why it was not detected in early scans, and she feels his surgery should be done earlier. I answered her questions with the best of my knowledge.   Pt is scheduled to have a consultation with [facility] within next week.   Plan to start chemo in about 2 weeks, his daughter wants to think about who will put the port in for him. She will  let us know next week.  I spent a total of 40 mins for his visit today.   Truitt Merle  01/10/2020

## 2020-01-10 ENCOUNTER — Inpatient Hospital Stay: Payer: Medicare Other

## 2020-01-10 ENCOUNTER — Other Ambulatory Visit: Payer: Self-pay

## 2020-01-10 ENCOUNTER — Encounter: Payer: Self-pay | Admitting: Nurse Practitioner

## 2020-01-10 ENCOUNTER — Telehealth: Payer: Self-pay

## 2020-01-10 ENCOUNTER — Inpatient Hospital Stay: Payer: Medicare Other | Attending: Hematology | Admitting: Nurse Practitioner

## 2020-01-10 VITALS — BP 134/89 | HR 61 | Temp 99.1°F | Resp 17 | Ht 73.0 in | Wt 223.4 lb

## 2020-01-10 DIAGNOSIS — N281 Cyst of kidney, acquired: Secondary | ICD-10-CM | POA: Diagnosis not present

## 2020-01-10 DIAGNOSIS — C186 Malignant neoplasm of descending colon: Secondary | ICD-10-CM

## 2020-01-10 DIAGNOSIS — C772 Secondary and unspecified malignant neoplasm of intra-abdominal lymph nodes: Secondary | ICD-10-CM | POA: Diagnosis not present

## 2020-01-10 DIAGNOSIS — C189 Malignant neoplasm of colon, unspecified: Secondary | ICD-10-CM | POA: Diagnosis not present

## 2020-01-10 LAB — CBC WITH DIFFERENTIAL/PLATELET
Abs Immature Granulocytes: 0.09 10*3/uL — ABNORMAL HIGH (ref 0.00–0.07)
Basophils Absolute: 0 10*3/uL (ref 0.0–0.1)
Basophils Relative: 0 %
Eosinophils Absolute: 0.2 10*3/uL (ref 0.0–0.5)
Eosinophils Relative: 2 %
HCT: 39.6 % (ref 39.0–52.0)
Hemoglobin: 12.7 g/dL — ABNORMAL LOW (ref 13.0–17.0)
Immature Granulocytes: 1 %
Lymphocytes Relative: 15 %
Lymphs Abs: 1.5 10*3/uL (ref 0.7–4.0)
MCH: 28.9 pg (ref 26.0–34.0)
MCHC: 32.1 g/dL (ref 30.0–36.0)
MCV: 90.2 fL (ref 80.0–100.0)
Monocytes Absolute: 0.8 10*3/uL (ref 0.1–1.0)
Monocytes Relative: 8 %
Neutro Abs: 7.5 10*3/uL (ref 1.7–7.7)
Neutrophils Relative %: 74 %
Platelets: 278 10*3/uL (ref 150–400)
RBC: 4.39 MIL/uL (ref 4.22–5.81)
RDW: 13.6 % (ref 11.5–15.5)
WBC: 10.1 10*3/uL (ref 4.0–10.5)
nRBC: 0 % (ref 0.0–0.2)

## 2020-01-10 LAB — COMPREHENSIVE METABOLIC PANEL
ALT: 20 U/L (ref 0–44)
AST: 19 U/L (ref 15–41)
Albumin: 4 g/dL (ref 3.5–5.0)
Alkaline Phosphatase: 91 U/L (ref 38–126)
Anion gap: 8 (ref 5–15)
BUN: 13 mg/dL (ref 8–23)
CO2: 28 mmol/L (ref 22–32)
Calcium: 9.4 mg/dL (ref 8.9–10.3)
Chloride: 106 mmol/L (ref 98–111)
Creatinine, Ser: 1.01 mg/dL (ref 0.61–1.24)
GFR calc Af Amer: >60 mL/min (ref >60)
GFR calc non Af Amer: >60 mL/min (ref >60)
Glucose, Bld: 97 mg/dL (ref 70–99)
Potassium: 4.8 mmol/L (ref 3.5–5.1)
Sodium: 142 mmol/L (ref 135–145)
Total Bilirubin: 0.9 mg/dL (ref 0.3–1.2)
Total Protein: 7.3 g/dL (ref 6.5–8.1)

## 2020-01-10 NOTE — Telephone Encounter (Signed)
Per Cira Rue NP called and scheduled patient for PET scan. It is scheduled for Monday 01/22/20 @ 11:00am, patient is to arrive at 10:30am, NPO after midnight, and hold any diabetic medication. Appointment date, time, and instructions were written down and given to patient.

## 2020-01-12 ENCOUNTER — Ambulatory Visit: Payer: Medicare Other | Attending: Internal Medicine

## 2020-01-12 DIAGNOSIS — Z23 Encounter for immunization: Secondary | ICD-10-CM | POA: Insufficient documentation

## 2020-01-12 NOTE — Progress Notes (Signed)
   Covid-19 Vaccination Clinic  Name:  James Doyle    MRN: [id number] DOB: 11-24-1946  01/12/2020  Mr. James Doyle was observed post Covid-19 immunization for 15 minutes without incidence. He was provided with Vaccine Information Sheet and instruction to access the V-Safe system.   Mr. James Doyle was instructed to call 911 with any severe reactions post vaccine: Marland Kitchen Difficulty breathing  . Swelling of your face and throat  . A fast heartbeat  . A bad rash all over your body  . Dizziness and weakness    Immunizations Administered    Name Date Dose VIS Date Route   Pfizer COVID-19 Vaccine 01/12/2020  3:44 PM 0.3 mL 11/10/2019 Intramuscular   Manufacturer: [place]   Lot: [id number]   [place]: [id number]

## 2020-01-14 ENCOUNTER — Encounter: Payer: Self-pay | Admitting: Nurse Practitioner

## 2020-01-14 MED ORDER — ONDANSETRON HCL 8 MG PO TABS: 8.0000 mg | ORAL_TABLET | Freq: Two times a day (BID) | ORAL | 1 refills | Status: DC | PRN

## 2020-01-14 MED ORDER — PROCHLORPERAZINE MALEATE 10 MG PO TABS: 10.0000 mg | ORAL_TABLET | Freq: Four times a day (QID) | ORAL | 1 refills | Status: DC | PRN

## 2020-01-14 NOTE — Progress Notes (Signed)
START ON PATHWAY REGIMEN - Colorectal     A cycle is every 14 days:     Oxaliplatin      Leucovorin      Fluorouracil      Fluorouracil   **Always confirm dose/schedule in your pharmacy ordering system**  Patient Characteristics: Distant Metastases, Postoperative Treatment for R0 Resection Tumor Location: Colon Therapeutic Status: Distant Metastases  Intent of Therapy: Curative Intent, Discussed with Patient

## 2020-01-15 ENCOUNTER — Telehealth: Payer: Self-pay

## 2020-01-15 NOTE — Telephone Encounter (Signed)
Received call from patient's daughter Lynelle Smoke letting us know that he is transferring his care to [facility].  She asked that I cancel all his scheduled appointments.  I made Dr. Burr Medico and Cira Rue NP aware.

## 2020-01-17 ENCOUNTER — Ambulatory Visit: Payer: Medicare Other

## 2020-01-18 ENCOUNTER — Ambulatory Visit: Payer: Medicare Other | Admitting: Hematology

## 2020-01-22 ENCOUNTER — Encounter (HOSPITAL_COMMUNITY): Payer: Medicare Other

## 2020-01-23 ENCOUNTER — Other Ambulatory Visit: Payer: Medicare Other

## 2020-01-23 ENCOUNTER — Ambulatory Visit: Payer: Medicare Other

## 2020-01-23 ENCOUNTER — Ambulatory Visit: Payer: Medicare Other | Admitting: Nurse Practitioner

## 2020-02-08 ENCOUNTER — Ambulatory Visit: Payer: Medicare Other

## 2020-02-08 ENCOUNTER — Other Ambulatory Visit: Payer: Medicare Other

## 2020-02-08 ENCOUNTER — Ambulatory Visit: Payer: Medicare Other | Admitting: Hematology

## 2020-02-16 ENCOUNTER — Ambulatory Visit: Payer: Medicare Other | Admitting: Hematology

## 2020-02-16 ENCOUNTER — Other Ambulatory Visit: Payer: Medicare Other

## 2020-03-06 ENCOUNTER — Encounter: Payer: Self-pay | Admitting: General Surgery

## 2020-08-09 ENCOUNTER — Other Ambulatory Visit: Payer: Self-pay | Admitting: Family Medicine

## 2020-08-09 DIAGNOSIS — H539 Unspecified visual disturbance: Secondary | ICD-10-CM

## 2020-08-12 MED FILL — GABAPENTIN 300 MG CAPSULE: 300 | 30 days supply | Qty: 60 | Fill #0

## 2020-08-30 ENCOUNTER — Ambulatory Visit
Admission: RE | Admit: 2020-08-30 | Discharge: 2020-08-30 | Disposition: A | Discharge status: Home / Self Care | Payer: Medicare Other | Source: Ambulatory Visit | Attending: Family Medicine | Admitting: Family Medicine

## 2020-08-30 ENCOUNTER — Other Ambulatory Visit: Payer: Self-pay

## 2020-08-30 DIAGNOSIS — H539 Unspecified visual disturbance: Secondary | ICD-10-CM

## 2020-08-30 MED ORDER — GADOBENATE DIMEGLUMINE 529 MG/ML IV SOLN
20.0000 mL | Freq: Once | INTRAVENOUS | Status: AC | PRN
  Administered 2020-08-30: 20 mL via INTRAVENOUS

## 2020-09-06 ENCOUNTER — Encounter: Payer: Self-pay | Admitting: Neurology

## 2020-09-10 ENCOUNTER — Ambulatory Visit: Payer: Medicare Other | Attending: Internal Medicine

## 2020-09-10 DIAGNOSIS — Z23 Encounter for immunization: Secondary | ICD-10-CM

## 2020-09-10 NOTE — Progress Notes (Signed)
   Covid-19 Vaccination Clinic  Name:  James Doyle    MRN: [id number] DOB: Apr 09, 1946  09/10/2020  James Doyle was observed post Covid-19 immunization for 15 minutes without incident. He was provided with Vaccine Information Sheet and instruction to access the V-Safe system.   James Doyle was instructed to call 911 with any severe reactions post vaccine: Marland Kitchen Difficulty breathing  . Swelling of face and throat  . A fast heartbeat  . A bad rash all over body  . Dizziness and weakness

## 2020-12-09 ENCOUNTER — Ambulatory Visit: Payer: Medicare Other | Admitting: Neurology

## 2021-02-07 ENCOUNTER — Telehealth: Payer: Self-pay | Admitting: Internal Medicine

## 2021-02-07 NOTE — Telephone Encounter (Signed)
Hey Dr Carlean Purl, this pt is requesting to schedule a colonoscopy with [place], he is a former [facility] patient but is not happy with the care he receives there, pt is requesting to see you, he had a colonoscopy done in 2019, I will send the report to you for review

## 2021-02-10 NOTE — Telephone Encounter (Signed)
I would be happy to see this patient please arrange a visit in the office regarding history of colon cancer  It is acceptable to use a follow-up visit if desired

## 2021-02-11 ENCOUNTER — Encounter: Payer: Self-pay | Admitting: Occupational Therapy

## 2021-02-11 ENCOUNTER — Other Ambulatory Visit: Payer: Self-pay

## 2021-02-11 ENCOUNTER — Ambulatory Visit: Payer: Medicare Other | Attending: Neurology | Admitting: Occupational Therapy

## 2021-02-11 DIAGNOSIS — M6281 Muscle weakness (generalized): Secondary | ICD-10-CM | POA: Diagnosis present

## 2021-02-11 DIAGNOSIS — R208 Other disturbances of skin sensation: Secondary | ICD-10-CM | POA: Insufficient documentation

## 2021-02-11 DIAGNOSIS — R278 Other lack of coordination: Secondary | ICD-10-CM

## 2021-02-11 DIAGNOSIS — R2681 Unsteadiness on feet: Secondary | ICD-10-CM | POA: Insufficient documentation

## 2021-02-11 NOTE — Therapy (Signed)
[facility] [address] [place], [place], [address] Phone: [phone]   Fax:  [phone]  Occupational Therapy Evaluation  Patient Details  Name: James Doyle MRN: [id number] Date of Birth: June 13, 1946 Referring Provider (OT): Richard Miu   Encounter Date: 02/11/2021   OT End of Session - 02/11/21 1601    Visit Number 1    Number of Visits 9    Date for OT Re-Evaluation 04/27/21    Authorization Type [employer] Medicare    Progress Note Due on Visit 10    OT Start Time 1220    OT Stop Time 1315    OT Time Calculation (min) 55 min    Activity Tolerance Patient tolerated treatment well    Behavior During Therapy [facility] for tasks assessed/performed           Past Medical History:  Diagnosis Date  . Aortic atherosclerosis ([place]) 12/27/2017   Noted on CT abd/pelvis  . Arthritis   . Cancer of left colon ([place]) 06/08/2017  . Cellulitis of left hand   . Chronic back pain   . Chronic pain of right knee   . Chronic right hip pain   . 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 DDD (degenerative disc disease), lumbar   . H/O vocal cord paralysis    left   . History of hypokalemia   . History of thumb fracture    Left  . Left nephrolithiasis 12/27/2017   Noted on CT abd/pelvis    Past Surgical History:  Procedure Laterality Date  . BACK SURGERY     times 2  . COLON RESECTION    . COLONOSCOPY    . COLOSTOMY    . COLOSTOMY TAKEDOWN N/A 02/09/2018   Procedure: LAPAROSCOPIC COLOSTOMY REVERSAL ERAS PATHWAY;  Surgeon: Leighton Ruff, MD;  Location: [facility];  Service: General;  Laterality: N/A;  . INNER EAR SURGERY     bilat  . LAPAROSCOPY N/A 06/09/2017   Procedure: LAPAROSCOPY DIAGNOSTIC, OPEN  LEFT COLECTOMY;  Surgeon: Leighton Ruff, MD;  Location: [facility];  Service: General;  Laterality: N/A;  . LAPAROSCOPY N/A 12/24/2019   Procedure: LAPAROSCOPY DIAGNOSTIC lysis of adhesions;  Surgeon: Leighton Ruff, MD;  Location: [facility];  Service: General;  Laterality: N/A;  . LAPAROTOMY N/A 12/24/2019   Procedure: small bowel resection;  Surgeon: Leighton Ruff, MD;  Location: [facility];  Service: General;  Laterality: N/A;  . PROSTATE BIOPSY    . TONSILLECTOMY    . TOTAL KNEE ARTHROPLASTY Right 07/27/2016   Procedure: RIGHT TOTAL KNEE ARTHROPLASTY;  Surgeon: Gaynelle Arabian, MD;  Location: [facility];  Service: Orthopedics;  Laterality: Right;    There were no vitals filed for this visit.   Subjective Assessment - 02/11/21 1221    Subjective  I just want to be normal again.  I can't even turn a bolt.    Currently in Pain? Yes    Pain Score 8     Pain Location Hand    Pain Orientation Right;Left    Pain Descriptors / Indicators Pins and needles    Pain Type Acute pain    Pain Onset More than a month ago    Pain Frequency Constant    Aggravating Factors  Cold, bumping    Pain Relieving Factors Lyrica.  Cannot take gabapentin    Effect of Pain on Daily Activities difficulty buttoning, playing guitar, tying shoes  [facility] OT Assessment - 02/11/21 0001      Assessment   Medical Diagnosis Polyneuropathy s/p chemo    Referring Provider (OT) Posey Pronto, [place]    Onset Date/Surgical Date 01/20/21    Hand Dominance Right    Prior Therapy NA      Precautions   Precautions Fall    Precaution Comments Very careful      Balance Screen   Has the patient fallen in the past 6 months Yes    How many times? 1      Prior Function   Level of Independence Independent with basic ADLs    Vocation Retired    Contractor, banjo, Location manager, upright bass      ADL   Eating/Feeding Modified independent    Grooming Modified independent    Upper Body Bathing Modified independent    Lower Body Bathing Modified independent    Upper Body Dressing Use of adaptive device    Lower Body Dressing Needs assist for fasteners    Toilet Transfer Modified independent    ADL comments Difficulty with hearing  aides,      Written Expression   Dominant Hand Right    Handwriting 90% legible      Vision - History   Baseline Vision Wears glasses only for reading    Visual History Glaucoma    Additional Comments has drops for glaucoma      Activity Tolerance   Activity Tolerance --   decreased endurance     Cognition   Cognition Comments Memory issues with chemo seems to be resolving      Observation/Other Assessments   Focus on Therapeutic Outcomes (FOTO)  NA      Posture/Postural Control   Posture/Postural Control No significant limitations      Sensation   Light Touch Impaired Detail   HAS AWARENESS OF LIGHT TOUCH   Stereognosis Impaired Detail   INCONSISTENT   Hot/Cold Not tested    Proprioception Appears Intact    Additional Comments Left more impaired than right - pins and needles      Coordination   Gross Motor Movements are Fluid and Coordinated No    Finger Nose Finger Test Undershooting    9 Hole Peg Test Right;Left    Right 9 Hole Peg Test 40.28    Left 9 Hole Peg Test 59.78      ROM / Strength   AROM / PROM / Strength AROM;PROM;Strength      AROM   Overall AROM  Within functional limits for tasks performed      Strength   Overall Strength Within functional limits for tasks performed    Overall Strength Comments Proximal BUE      Hand Function   Right Hand Gross Grasp Impaired    Right Hand Grip (lbs) 52    Right Hand Lateral Pinch 8 lbs    Right Hand 3 Point Pinch 12 lbs    Left Hand Gross Grasp Functional    Left Hand Grip (lbs) 75    Left Hand Lateral Pinch 12 lbs    Left 3 point pinch 14 lbs                           OT Education - 02/11/21 1601    Education Details OT eval results, potential goals    Person(s) Educated Patient;Child(ren)    Methods Explanation    Comprehension Verbalized understanding;Need further instruction  OT Short Term Goals - 02/11/21 1611      OT SHORT TERM GOAL #1   Title Patient will  complete HEP designed to improve grip and pinch strength    Time 4    Period Weeks    Status New    Target Date 03/28/21      OT SHORT TERM GOAL #2   Title Patient will demonstrate understanding of adaptive dressing equipment - button hook, zipper pull, zipper rings    Time 4    Period Weeks    Status New      OT SHORT TERM GOAL #3   Title Patient will demonstrate understanding of techniques to reduce pain in BUE    Time 4    Period Weeks    Status New             OT Long Term Goals - 02/11/21 1613      OT LONG TERM GOAL #1   Title Patient will complete an updated HEP to address strength and coordiantion in BUE    Time 8    Period Weeks    Status New      OT LONG TERM GOAL #2   Title Patient will self report improved ability to use a pick to play guitar for one full song, or to explore optional methods to pick or strum instrument    Time 8    Period Weeks    Status New      OT LONG TERM GOAL #3   Title Patient will demonstrate 5 lb increase in grip strength in RUE    Time 8    Period Weeks    Status New      OT LONG TERM GOAL #4   Title Patient will demonstrate 5 sec decrease in time to complete 9 hole peg test in BUE    Time 8    Period Weeks    Status New                 Plan - 02/11/21 1602    Clinical Impression Statement Patient is a 75 yr old male s/p chemotherapy for metastatic colon cancer now with polyneuropathy.  Patient has limited functional use of BUE secondary to hypersensitivity, and numbness/pain.  Patient with decreased strength in dominant right hand.  Patient with neuropathy in feet as well, and has noticed mild changes to balance.  Daughter also reports decreased activity tolerance.  Patient reports difficulty with fine motor coordination, and playing musical stringed instruments - guitar, banjo, etc.  Patient also helping to care for his wife who recently had knee surgery.  Patient has supportive family.  Daughter attended therapy  session today.  Patient will benefit from skilled OT intervention to improve functional use of BUE, and hopefully minimize pain in hands.    OT Occupational Profile and History Problem Focused Assessment - Including review of records relating to presenting problem    Occupational performance deficits (Please refer to evaluation for details): ADL's;IADL's;Leisure    Body Structure / Function / Physical Skills ADL;Coordination;Endurance;UE functional use;Sensation;Balance;Body mechanics;Decreased knowledge of use of DME;IADL;Pain;Strength;FMC;Dexterity;Mobility;ROM    Rehab Potential Good    Clinical Decision Making Limited treatment options, no task modification necessary    Comorbidities Affecting Occupational Performance: May have comorbidities impacting occupational performance    Modification or Assistance to Complete Evaluation  No modification of tasks or assist necessary to complete eval    OT Frequency 1x / week    OT Duration 8  weeks    OT Treatment/Interventions Self-care/ADL training;Therapeutic exercise;Moist Heat;Splinting;Patient/family education;Balance training;Therapeutic activities;Functional Mobility Training;Fluidtherapy;Ultrasound;DME and/or AE instruction    Plan Therapy putty if he can tolerate.  May do well with heat first (moist heat, or fluido), coordination exercise    Consulted and Agree with Plan of Care Patient;Family member/caregiver           Patient will benefit from skilled therapeutic intervention in order to improve the following deficits and impairments:   Body Structure / Function / Physical Skills: ADL,Coordination,Endurance,UE functional use,Sensation,Balance,Body mechanics,Decreased knowledge of use of DME,IADL,Pain,Strength,FMC,Dexterity,Mobility,ROM       Visit Diagnosis: Other lack of coordination  Other disturbances of skin sensation  Muscle weakness (generalized)  Unsteadiness on feet    Problem List Patient Active Problem List    Diagnosis Date Noted  . 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 SBO (small bowel obstruction) ([place]) 12/22/2019  . Abdominal distention 11/18/2019  . Essential hypertension 11/18/2019  . Hyperbilirubinemia 11/18/2019  . Leukocytosis 11/18/2019  . Hyperglycemia 11/18/2019  . 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 Status post end colostomy takedown laparoscopically 02/09/2018 06/12/2017  . Cancer of left colon pT3, pN1b, s/p colectomy 2018 06/08/2017  . OA (osteoarthritis) of knee 07/27/2016    Mariah Milling, OTR/L 02/11/2021, 6:49 PM  [facility] [address] [place], [place], [address] Phone: [phone]   Fax:  [phone]  Name: James Doyle MRN: [id number] Date of Birth: 15-Dec-1945

## 2021-02-13 NOTE — Telephone Encounter (Signed)
Left msg on pt's vm to call back 

## 2021-02-13 NOTE — Telephone Encounter (Signed)
Will place records in cabinet folder

## 2021-02-18 ENCOUNTER — Ambulatory Visit: Payer: Medicare Other | Admitting: Occupational Therapy

## 2021-02-18 ENCOUNTER — Other Ambulatory Visit: Payer: Self-pay

## 2021-02-18 ENCOUNTER — Encounter: Payer: Self-pay | Admitting: Occupational Therapy

## 2021-02-18 DIAGNOSIS — R278 Other lack of coordination: Secondary | ICD-10-CM | POA: Diagnosis not present

## 2021-02-18 DIAGNOSIS — M6281 Muscle weakness (generalized): Secondary | ICD-10-CM

## 2021-02-18 DIAGNOSIS — R208 Other disturbances of skin sensation: Secondary | ICD-10-CM

## 2021-02-18 DIAGNOSIS — R2681 Unsteadiness on feet: Secondary | ICD-10-CM

## 2021-02-18 NOTE — Patient Instructions (Signed)
11. Grip Strengthening (Resistive Putty)   Squeeze putty using thumb and all fingers. Repeat 15 times. Do 1-2 sessions per day.   Extension (Assistive Putty)   Roll putty back and forth, being sure to use all fingertips. Repeat 3 times. Do 1-2 sessions per day.  Then pinch as below.   Palmar Pinch Strengthening (Resistive Putty)   Pinch putty between thumb and each fingertip in turn after rolling out            IP Fisting (Resistive Putty)   Keeping knuckles straight, bend fingertips to squeeze putty. Repeat 10 times. Do 1-2 sessions per day.  MP Flexion (Resistive Putty)   Bending only at large knuckles, press putty down against thumb. Keep fingertips straight. Repeat 10 times. Do 1-2 sessions per day.   Lateral Pinch Strengthening (Resistive Putty)    Squeeze between thumb and side of each finger in turn. Repeat 10 times. Do 1-2 sessions per day.   FINGERS: Extension (Putty)    Open hand and fingers to flatten putty. ___ reps per set, ___ sets per day, ___ days per week    Adduction (Resistive Putty)    Press between 2 fingers and pull putty anchored with other hand. Repeat with all fingers. Repeat 1-2 times. Do 1-2 sessions per day.   FINGERS: Intrinsic (Putty)    Flatten putty by separating fingers. Keep fingers straight. ___ reps per set, ___ sets per day, ___ days per week    Thumb Adduction (Resistive Putty)    Press putty with thumb against index finger. Keep all fingers straight. Repeat 10 times. Do 1-2 sessions per day.

## 2021-02-18 NOTE — Therapy (Signed)
[facility] [address] [place], [place], [address] Phone: [phone]   Fax:  [phone]  Occupational Therapy Treatment  Patient Details  Name: James Doyle MRN: [id number] Date of Birth: 08-12-1946 Referring Provider (OT): Richard Miu   Encounter Date: 02/18/2021   OT End of Session - 02/18/21 1849    Visit Number 2    Number of Visits 9    Date for OT Re-Evaluation 04/27/21    Authorization Type [employer] Medicare    Progress Note Due on Visit 10    OT Start Time 1745    OT Stop Time 1830    OT Time Calculation (min) 45 min    Activity Tolerance Patient tolerated treatment well    Behavior During Therapy [facility] for tasks assessed/performed           Past Medical History:  Diagnosis Date  . Aortic atherosclerosis ([place]) 12/27/2017   Noted on CT abd/pelvis  . Arthritis   . Cancer of left colon ([place]) 06/08/2017  . Cellulitis of left hand   . Chronic back pain   . Chronic pain of right knee   . Chronic right hip pain   . 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 DDD (degenerative disc disease), lumbar   . H/O vocal cord paralysis    left   . History of hypokalemia   . History of thumb fracture    Left  . Left nephrolithiasis 12/27/2017   Noted on CT abd/pelvis    Past Surgical History:  Procedure Laterality Date  . BACK SURGERY     times 2  . COLON RESECTION    . COLONOSCOPY    . COLOSTOMY    . COLOSTOMY TAKEDOWN N/A 02/09/2018   Procedure: LAPAROSCOPIC COLOSTOMY REVERSAL ERAS PATHWAY;  Surgeon: Leighton Ruff, MD;  [facility]: [facility];  Service: General;  Laterality: N/A;  . INNER EAR SURGERY     bilat  . LAPAROSCOPY N/A 06/09/2017   Procedure: LAPAROSCOPY DIAGNOSTIC, OPEN  LEFT COLECTOMY;  Surgeon: Leighton Ruff, MD;  [facility];  Service: General;  Laterality: N/A;  . LAPAROSCOPY N/A 12/24/2019   Procedure: LAPAROSCOPY DIAGNOSTIC lysis of adhesions;  Surgeon: Leighton Ruff, MD;  [facility]: [facility];  Service: General;  Laterality: N/A;  . LAPAROTOMY N/A 12/24/2019   Procedure: small bowel resection;  Surgeon: Leighton Ruff, MD;  [facility];  Service: General;  Laterality: N/A;  . PROSTATE BIOPSY    . TONSILLECTOMY    . TOTAL KNEE ARTHROPLASTY Right 07/27/2016   Procedure: RIGHT TOTAL KNEE ARTHROPLASTY;  Surgeon: Gaynelle Arabian, MD;  [facility];  Service: Orthopedics;  Laterality: Right;    There were no vitals filed for this visit.   Subjective Assessment - 02/18/21 1753    Subjective  I can't get a hold of things with my hands    Patient is accompanied by: Family member    Currently in Pain? No/denies    Pain Score 0-No pain                        OT Treatments/Exercises (OP) - 02/18/21 0001      ADLs   UB Dressing Demonstrated and allowed patient to practice with button hook.  Patient has replaced many of his shirts for snap shirts - but was able to use button hook without difficulty.  Demo'd zipper ring and zipper pull as well.  Daughter purchased button hook and zipper rings  during session.    Leisure Gave patient nitrile gloves to attempt to use for holding a pick - to see if this helped him maintain control of pick in right hand.    ADL Comments Reviewed short and long term goals with patient and his daughter.  Both in agreement - and eager to improve grip strength especially.      Exercises   Exercises Theraputty;Hand      Hand Exercises   Other Hand Exercises Screwing and unscrewing nuts and bolts with BUE.  Able to use either hand blindly or without vision occluded.  Encouraged patient to practice at home.      Theraputty   Theraputty - Flatten red    Theraputty - Roll red    Theraputty - Grip red    Theraputty - Pinch red                  OT Education - 02/18/21 1848    Education Details Short and long term goals, red putty exercises, button hook, zipper rings    Person(s) Educated  Patient;Child(ren)    Methods Explanation;Demonstration;Verbal cues;Handout    Comprehension Verbalized understanding;Returned demonstration            OT Short Term Goals - 02/18/21 1755      OT SHORT TERM GOAL #1   Title Patient will complete HEP designed to improve grip and pinch strength    Time 4    Period Weeks    Status On-going    Target Date 03/28/21      OT SHORT TERM GOAL #2   Title Patient will demonstrate understanding of adaptive dressing equipment - button hook, zipper pull, zipper rings    Time 4    Period Weeks    Status On-going      OT SHORT TERM GOAL #3   Title Patient will demonstrate understanding of techniques to reduce pain in BUE    Time 4    Period Weeks    Status On-going             OT Long Term Goals - 02/18/21 1757      OT LONG TERM GOAL #1   Title Patient will complete an updated HEP to address strength and coordiantion in BUE    Time 8    Period Weeks    Status On-going      OT LONG TERM GOAL #2   Title Patient will self report improved ability to use a pick to play guitar for one full song, or to explore optional methods to pick or strum instrument    Time 8    Period Weeks    Status On-going      OT LONG TERM GOAL #3   Title Patient will demonstrate 5 lb increase in grip strength in RUE    Time 8    Period Weeks      OT LONG TERM GOAL #4   Title Patient will demonstrate 5 sec decrease in time to complete 9 hole peg test in BUE    Time [place] - 02/18/21 1849    Clinical Impression Statement Patient excited to work toward improving grip strength and coordination in [place] and History Problem Focused Assessment - Including review of records relating to presenting problem    Occupational  performance deficits (Please refer to evaluation for details): ADL's;IADL's;Leisure    Body Structure / Function / Physical Skills  ADL;Coordination;Endurance;UE functional use;Sensation;Balance;Body mechanics;Decreased knowledge of use of DME;IADL;Pain;Strength;FMC;Dexterity;Mobility;ROM    Rehab Potential Good    Clinical Decision Making Limited treatment options, no task modification necessary    Comorbidities Affecting Occupational Performance: May have comorbidities impacting occupational performance    Modification or Assistance to Complete Evaluation  No modification of tasks or assist necessary to complete eval    OT Frequency 1x / week    OT Duration 8 weeks    OT Treatment/Interventions Self-care/ADL training;Therapeutic exercise;Moist Heat;Splinting;Patient/family education;Balance training;Therapeutic activities;Functional Mobility Training;Fluidtherapy;Ultrasound;DME and/or AE instruction    Plan Check on putty exercises given 3/22, develop coordination exercises, guitar playing if he brings    OT Home Exercise Plan red putty    Consulted and Agree with Plan of Care Patient;Family member/caregiver    Family Member Consulted daughter           Patient will benefit from skilled therapeutic intervention in order to improve the following deficits and impairments:   Body Structure / Function / Physical Skills: ADL,Coordination,Endurance,UE functional use,Sensation,Balance,Body mechanics,Decreased knowledge of use of DME,IADL,Pain,Strength,FMC,Dexterity,Mobility,ROM       Visit Diagnosis: Other lack of coordination  Other disturbances of skin sensation  Muscle weakness (generalized)  Unsteadiness on feet    Problem List Patient Active Problem List   Diagnosis Date Noted  . 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 SBO (small bowel obstruction) ([place]) 12/22/2019  . Abdominal distention 11/18/2019  . Essential hypertension 11/18/2019  . Hyperbilirubinemia 11/18/2019  . Leukocytosis 11/18/2019  . Hyperglycemia 11/18/2019  . Colon obstruction  s/p colectomy/colostomy 06/09/2017 06/12/2017  . Status post end colostomy takedown laparoscopically 02/09/2018 06/12/2017  . Cancer of left colon pT3, pN1b, s/p colectomy 2018 06/08/2017  . OA (osteoarthritis) of knee 07/27/2016    Mariah Milling, OTR/L  02/18/2021, 6:51 PM  [facility] [address] [place], [place], [address] Phone: [phone]   Fax:  [phone]  Name: James Doyle MRN: [id number] Date of Birth: Mar 30, 1946

## 2021-02-25 ENCOUNTER — Other Ambulatory Visit: Payer: Self-pay

## 2021-02-25 ENCOUNTER — Encounter: Payer: Self-pay | Admitting: Occupational Therapy

## 2021-02-25 ENCOUNTER — Ambulatory Visit: Payer: Medicare Other | Admitting: Occupational Therapy

## 2021-02-25 DIAGNOSIS — R278 Other lack of coordination: Secondary | ICD-10-CM

## 2021-02-25 DIAGNOSIS — M6281 Muscle weakness (generalized): Secondary | ICD-10-CM

## 2021-02-25 DIAGNOSIS — R208 Other disturbances of skin sensation: Secondary | ICD-10-CM

## 2021-02-25 DIAGNOSIS — R2681 Unsteadiness on feet: Secondary | ICD-10-CM

## 2021-02-25 NOTE — Patient Instructions (Signed)
  Coordination Activities  Perform the following activities for 10 minutes  times per day with both hand(s).   Flip cards 1 at a time as fast as you can.  Deal cards with your thumb (Hold deck in hand and push card off top with thumb).  Rotate card in hand (clockwise and counter-clockwise).  Pick up coins, buttons, marbles, dried beans/pasta of different sizes and place in container.  Pick up coins and place in container or coin bank.  Pick up coins and stack.  Pick up coins one at a time until you get 5-10 in your hand, then move coins from palm to fingertips to stack one at a time.  Screw together nuts and bolts, then unfasten.

## 2021-02-25 NOTE — Therapy (Signed)
[facility] [address] [place], [place], [address] Phone: [phone]   Fax:  [phone]  Occupational Therapy Treatment  Patient Details  Name: James Doyle MRN: [id number] Date of Birth: [birth year] Referring Provider (OT): Richard Miu   Encounter Date: 02/25/2021   OT End of Session - 02/25/21 1615    Visit Number 3    Number of Visits 9    Date for OT Re-Evaluation 04/27/21    Authorization Type [employer] Medicare    Progress Note Due on Visit 10    OT Start Time 1317    OT Stop Time 1400    OT Time Calculation (min) 43 min    Activity Tolerance Patient tolerated treatment well    Behavior During Therapy [facility] for tasks assessed/performed           Past Medical History:  Diagnosis Date  . Aortic atherosclerosis ([place]) 12/27/2017   Noted on CT abd/pelvis  . Arthritis   . Cancer of left colon ([place]) 06/08/2017  . Cellulitis of left hand   . Chronic back pain   . Chronic pain of right knee   . Chronic right hip pain   . 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 DDD (degenerative disc disease), lumbar   . H/O vocal cord paralysis    left   . History of hypokalemia   . History of thumb fracture    Left  . Left nephrolithiasis 12/27/2017   Noted on CT abd/pelvis    Past Surgical History:  Procedure Laterality Date  . BACK SURGERY     times 2  . COLON RESECTION    . COLONOSCOPY    . COLOSTOMY    . COLOSTOMY TAKEDOWN N/A 02/09/2018   Procedure: LAPAROSCOPIC COLOSTOMY REVERSAL ERAS PATHWAY;  Surgeon: Leighton Ruff, MD;  Location: [facility];  Service: General;  Laterality: N/A;  . INNER EAR SURGERY     bilat  . LAPAROSCOPY N/A 06/09/2017   Procedure: LAPAROSCOPY DIAGNOSTIC, OPEN  LEFT COLECTOMY;  Surgeon: Leighton Ruff, MD;  Location: [facility];  Service: General;  Laterality: N/A;  . LAPAROSCOPY N/A 12/24/2019   Procedure: LAPAROSCOPY DIAGNOSTIC lysis of adhesions;  Surgeon: Leighton Ruff, MD;  Location: [facility];  Service: General;  Laterality: N/A;  . LAPAROTOMY N/A 12/24/2019   Procedure: small bowel resection;  Surgeon: Leighton Ruff, MD;  Location: [facility];  Service: General;  Laterality: N/A;  . PROSTATE BIOPSY    . TONSILLECTOMY    . TOTAL KNEE ARTHROPLASTY Right 07/27/2016   Procedure: RIGHT TOTAL KNEE ARTHROPLASTY;  Surgeon: Gaynelle Arabian, MD;  Location: [facility];  Service: Orthopedics;  Laterality: Right;    There were no vitals filed for this visit.   Subjective Assessment - 02/25/21 1321    Subjective  I was able to play the dobro - I think that putty is helping me    Patient is accompanied by: Family member    Currently in Pain? No/denies    Pain Score 0-No pain              [facility] OT Assessment - 02/25/21 0001      Hand Function   Right Hand Grip (lbs) 79    Right Hand Lateral Pinch 16 lbs    Left Hand Grip (lbs) 89    Left Hand Lateral Pinch 14 lbs                    OT Treatments/Exercises (OP) -  02/25/21 0001      ADLs   LB Dressing Daughter reports adding zipper ring to pants - and patient indicates this has been helpful    Leisure Reports improved ability to use pick and play dobro (less chording with fingers)      Exercises   Exercises Hand      Hand Exercises   Other Hand Exercises See patient instructions - for coordination HEP established this session. Recommended several games patients could play with grandchildren to further challenge coordination.                  OT Education - 02/25/21 1615    Education Details coordination exercises    Person(s) Educated Patient;Child(ren)    Methods Explanation;Demonstration;Tactile cues;Verbal cues;Handout    Comprehension Verbalized understanding;Returned demonstration            OT Short Term Goals - 02/25/21 1616      OT SHORT TERM GOAL #1   Title Patient will complete HEP designed to improve grip and pinch strength    Time 4    Period Weeks    Status  Achieved    Target Date 03/28/21      OT SHORT TERM GOAL #2   Title Patient will demonstrate understanding of adaptive dressing equipment - button hook, zipper pull, zipper rings    Time 4    Period Weeks    Status On-going      OT SHORT TERM GOAL #3   Title Patient will demonstrate understanding of techniques to reduce pain in BUE    Time 4    Period Weeks    Status On-going             OT Long Term Goals - 02/25/21 1617      OT LONG TERM GOAL #1   Title Patient will complete an updated HEP to address strength and coordiantion in BUE    Time 8    Period Weeks    Status On-going      OT LONG TERM GOAL #2   Title Patient will self report improved ability to use a pick to play guitar for one full song, or to explore optional methods to pick or strum instrument    Time 8    Period Weeks    Status On-going      OT LONG TERM GOAL #3   Title Patient will demonstrate 5 lb increase in grip strength in RUE    Time 8    Period Weeks    Status Achieved      OT LONG TERM GOAL #4   Title Patient will demonstrate 5 sec decrease in time to complete 9 hole peg test in BUE    Time 8    Period Weeks    Status On-going                 Plan - 02/25/21 1615    Clinical Impression Statement Patient demonstrates significant improvement in grip strength!    OT Occupational Profile and History Problem Focused Assessment - Including review of records relating to presenting problem    Occupational performance deficits (Please refer to evaluation for details): ADL's;IADL's;Leisure    Body Structure / Function / Physical Skills ADL;Coordination;Endurance;UE functional use;Sensation;Balance;Body mechanics;Decreased knowledge of use of DME;IADL;Pain;Strength;FMC;Dexterity;Mobility;ROM    Rehab Potential Good    Clinical Decision Making Limited treatment options, no task modification necessary    Comorbidities Affecting Occupational Performance: May have comorbidities impacting  occupational performance  Modification or Assistance to Complete Evaluation  No modification of tasks or assist necessary to complete eval    OT Frequency 1x / week    OT Duration 8 weeks    OT Treatment/Interventions Self-care/ADL training;Therapeutic exercise;Moist Heat;Splinting;Patient/family education;Balance training;Therapeutic activities;Functional Mobility Training;Fluidtherapy;Ultrasound;DME and/or AE instruction    Plan Check on putty exercises given 3/22, develop coordination exercises, guitar playing if he brings    OT Home Exercise Plan red putty    Consulted and Agree with Plan of Care Patient;Family member/caregiver    Family Member Consulted daughter           Patient will benefit from skilled therapeutic intervention in order to improve the following deficits and impairments:   Body Structure / Function / Physical Skills: ADL,Coordination,Endurance,UE functional use,Sensation,Balance,Body mechanics,Decreased knowledge of use of DME,IADL,Pain,Strength,FMC,Dexterity,Mobility,ROM       Visit Diagnosis: Other lack of coordination  Other disturbances of skin sensation  Muscle weakness (generalized)  Unsteadiness on feet    Problem List Patient Active Problem List   Diagnosis Date Noted  . 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 SBO (small bowel obstruction) ([place]) 12/22/2019  . Abdominal distention 11/18/2019  . Essential hypertension 11/18/2019  . Hyperbilirubinemia 11/18/2019  . Leukocytosis 11/18/2019  . Hyperglycemia 11/18/2019  . 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 Status post end colostomy takedown laparoscopically 02/09/2018 06/12/2017  . Cancer of left colon pT3, pN1b, s/p colectomy 2018 06/08/2017  . OA (osteoarthritis) of knee 07/27/2016    Mariah Milling, OTR/L 02/25/2021, 4:18 PM  [facility] [address] [place], [place], [address] Phone: [phone]   Fax:  [phone]  Name: YASSEN KINNETT MRN: [id number] Date of Birth: 1946-05-11

## 2021-03-04 ENCOUNTER — Encounter: Payer: Self-pay | Admitting: Internal Medicine

## 2021-03-04 ENCOUNTER — Ambulatory Visit: Payer: Medicare Other | Attending: Family Medicine | Admitting: Occupational Therapy

## 2021-03-04 ENCOUNTER — Encounter: Payer: Self-pay | Admitting: Occupational Therapy

## 2021-03-04 ENCOUNTER — Other Ambulatory Visit: Payer: Self-pay

## 2021-03-04 DIAGNOSIS — R278 Other lack of coordination: Secondary | ICD-10-CM

## 2021-03-04 DIAGNOSIS — R208 Other disturbances of skin sensation: Secondary | ICD-10-CM

## 2021-03-04 DIAGNOSIS — R2681 Unsteadiness on feet: Secondary | ICD-10-CM | POA: Diagnosis present

## 2021-03-04 DIAGNOSIS — M6281 Muscle weakness (generalized): Secondary | ICD-10-CM

## 2021-03-04 NOTE — Therapy (Signed)
[facility] [address] [place], [place], [address] Phone: [phone]   Fax:  [phone]  Occupational Therapy Treatment and Discharge Summary  Patient Details  Name: James Doyle MRN: [id number] Date of Birth: Dec 31, 1945 Referring Provider (OT): Richard Miu   Encounter Date: 03/04/2021   OT End of Session - 03/04/21 1602    Visit Number 4    Number of Visits 9    Date for OT Re-Evaluation 04/27/21    Authorization Type [employer] Medicare    Progress Note Due on Visit 10    OT Start Time 1147    OT Stop Time 1233    OT Time Calculation (min) 46 min    Activity Tolerance Patient tolerated treatment well    Behavior During Therapy [facility] for tasks assessed/performed           Past Medical History:  Diagnosis Date  . Aortic atherosclerosis ([place]) 12/27/2017   Noted on CT abd/pelvis  . Arthritis   . Cancer of left colon ([place]) 06/08/2017  . Cellulitis of left hand   . Chronic back pain   . Chronic pain of right knee   . Chronic right hip pain   . 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 DDD (degenerative disc disease), lumbar   . H/O vocal cord paralysis    left   . History of hypokalemia   . History of thumb fracture    Left  . Left nephrolithiasis 12/27/2017   Noted on CT abd/pelvis    Past Surgical History:  Procedure Laterality Date  . BACK SURGERY     times 2  . COLON RESECTION    . COLONOSCOPY    . COLOSTOMY    . COLOSTOMY TAKEDOWN N/A 02/09/2018   Procedure: LAPAROSCOPIC COLOSTOMY REVERSAL ERAS PATHWAY;  Surgeon: Leighton Ruff, MD;  [facility];  Service: General;  Laterality: N/A;  . INNER EAR SURGERY     bilat  . LAPAROSCOPY N/A 06/09/2017   Procedure: LAPAROSCOPY DIAGNOSTIC, OPEN  LEFT COLECTOMY;  Surgeon: Leighton Ruff, MD;  [facility];  Service: General;  Laterality: N/A;  . LAPAROSCOPY N/A 12/24/2019   Procedure: LAPAROSCOPY DIAGNOSTIC lysis of adhesions;   Surgeon: Leighton Ruff, MD;  [facility];  Service: General;  Laterality: N/A;  . LAPAROTOMY N/A 12/24/2019   Procedure: small bowel resection;  Surgeon: Leighton Ruff, MD;  [facility];  Service: General;  Laterality: N/A;  . PROSTATE BIOPSY    . TONSILLECTOMY    . TOTAL KNEE ARTHROPLASTY Right 07/27/2016   Procedure: RIGHT TOTAL KNEE ARTHROPLASTY;  Surgeon: Gaynelle Arabian, MD;  [facility];  Service: Orthopedics;  Laterality: Right;    There were no vitals filed for this visit.   Subjective Assessment - 03/04/21 1557    Subjective  I think that putty is really helping    Patient is accompanied by: Family member    Currently in Pain? No/denies    Pain Score 0-No pain              [facility] OT Assessment - 03/04/21 0001      Coordination   Right 9 Hole Peg Test 34.78    Left 9 Hole Peg Test 51.15                    OT Treatments/Exercises (OP) - 03/04/21 0001      ADLs   Medication Management Patient able to remove and replace hearing aides into  his ears with increased time (minimal for right ear) Explained to patient that use of a mirror may be helpful if he feels he cannot put hearing aides in - as he has reported in the past, but he had little difficulty this session.    Leisure Patient brought his guitar today,and was able to play a full song with minimal challenges with placing fingers (specifically ring finger on left hand on 4th string)  Patient able to effectively use pick in right hand - significant improvement, and chord with left hand.   Long discussion with patient about the benefit of practicing, and learning to compensate for reduced sensory awareness in fingers.                  OT Education - 03/04/21 1601    Education Details reviewed remaining goals and progress - paln to discharge    Person(s) Educated Patient;Child(ren)    Methods Explanation    Comprehension Verbalized understanding            OT Short Term Goals -  03/04/21 1603      OT SHORT TERM GOAL #1   Title Patient will complete HEP designed to improve grip and pinch strength    Time 4    Period Weeks    Status Achieved    Target Date 03/28/21      OT SHORT TERM GOAL #2   Title Patient will demonstrate understanding of adaptive dressing equipment - button hook, zipper pull, zipper rings    Time 4    Period Weeks    Status Achieved      OT SHORT TERM GOAL #3   Title Patient will demonstrate understanding of techniques to reduce pain in BUE    Time 4    Period Weeks    Status Achieved             OT Long Term Goals - 03/04/21 1604      OT LONG TERM GOAL #1   Title Patient will complete an updated HEP to address strength and coordiantion in BUE    Time 8    Period Weeks    Status Achieved      OT LONG TERM GOAL #2   Title Patient will self report improved ability to use a pick to play guitar for one full song, or to explore optional methods to pick or strum instrument    Time 8    Period Weeks    Status Achieved      OT LONG TERM GOAL #3   Title Patient will demonstrate 5 lb increase in grip strength in RUE    Time 8    Period Weeks    Status Achieved      OT LONG TERM GOAL #4   Title Patient will demonstrate 5 sec decrease in time to complete 9 hole peg test in BUE    Time 8    Period Weeks    Status Achieved                 Plan - 03/04/21 1602    Clinical Impression Statement Patient has met all OT goals and is agreeable to OT discharge.  Patient with significant improvement in functional use of his hands, and improved awareness of compensatory strategies to help with ADL.    OT Occupational Profile and History Problem Focused Assessment - Including review of records relating to presenting problem    Occupational performance deficits (Please refer to evaluation  for details): ADL's;IADL's;Leisure    Body Structure / Function / Physical Skills ADL;Coordination;Endurance;UE functional  use;Sensation;Balance;Body mechanics;Decreased knowledge of use of DME;IADL;Pain;Strength;FMC;Dexterity;Mobility;ROM    Rehab Potential Good    Clinical Decision Making Limited treatment options, no task modification necessary    Comorbidities Affecting Occupational Performance: May have comorbidities impacting occupational performance    Modification or Assistance to Complete Evaluation  No modification of tasks or assist necessary to complete eval    OT Frequency 1x / week    OT Duration 8 weeks    OT Treatment/Interventions Self-care/ADL training;Therapeutic exercise;Moist Heat;Splinting;Patient/family education;Balance training;Therapeutic activities;Functional Mobility Training;Fluidtherapy;Ultrasound;DME and/or AE instruction    Plan DISCHARGE    OT Home Exercise Plan red putty    Consulted and Agree with Plan of Care Patient;Family member/caregiver    Family Member Consulted daughter           Patient will benefit from skilled therapeutic intervention in order to improve the following deficits and impairments:   Body Structure / Function / Physical Skills: ADL,Coordination,Endurance,UE functional use,Sensation,Balance,Body mechanics,Decreased knowledge of use of DME,IADL,Pain,Strength,FMC,Dexterity,Mobility,ROM       Visit Diagnosis: Other lack of coordination  Other disturbances of skin sensation  Muscle weakness (generalized)  Unsteadiness on feet    Problem List Patient Active Problem List   Diagnosis Date Noted  . 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 SBO (small bowel obstruction) ([place]) 12/22/2019  . Abdominal distention 11/18/2019  . Essential hypertension 11/18/2019  . Hyperbilirubinemia 11/18/2019  . Leukocytosis 11/18/2019  . Hyperglycemia 11/18/2019  . 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 Status post end colostomy takedown laparoscopically 02/09/2018 06/12/2017  .  Cancer of left colon pT3, pN1b, s/p colectomy 2018 06/08/2017  . OA (osteoarthritis) of knee 07/27/2016    Mariah Milling, OTR/L 03/04/2021, 4:04 PM  [facility] [address] [place], [place], [address] Phone: [phone]   Fax:  [phone]  Name: James Doyle MRN: [id number] Date of Birth: Jul 04, 1946

## 2021-03-18 ENCOUNTER — Ambulatory Visit: Payer: Medicare Other | Admitting: Occupational Therapy

## 2021-03-25 ENCOUNTER — Ambulatory Visit: Payer: Medicare Other | Admitting: Occupational Therapy

## 2021-04-01 ENCOUNTER — Ambulatory Visit: Payer: Medicare Other | Admitting: Occupational Therapy

## 2021-04-08 ENCOUNTER — Encounter: Payer: Medicare Other | Admitting: Occupational Therapy

## 2021-04-15 ENCOUNTER — Encounter: Payer: Medicare Other | Admitting: Occupational Therapy

## 2021-05-15 ENCOUNTER — Encounter: Payer: Medicare Other | Admitting: Internal Medicine

## 2021-06-04 ENCOUNTER — Ambulatory Visit: Payer: Medicare Other | Admitting: Internal Medicine

## 2021-08-05 ENCOUNTER — Ambulatory Visit: Payer: Medicare Other | Admitting: Internal Medicine

## 2021-08-05 ENCOUNTER — Encounter: Payer: Self-pay | Admitting: Internal Medicine

## 2021-08-05 VITALS — BP 102/60 | HR 68 | Ht 73.0 in | Wt 230.0 lb

## 2021-08-05 DIAGNOSIS — C787 Secondary malignant neoplasm of liver and intrahepatic bile duct: Secondary | ICD-10-CM

## 2021-08-05 DIAGNOSIS — C189 Malignant neoplasm of colon, unspecified: Secondary | ICD-10-CM | POA: Diagnosis not present

## 2021-08-05 NOTE — Patient Instructions (Signed)
If you are age 78 or older, your body mass index should be between 23-30. Your Body mass index is 30.34 kg/m. If this is out of the aforementioned range listed, please consider follow up with your Primary Care Provider.  If you are age 63 or younger, your body mass index should be between 19-25. Your Body mass index is 30.34 kg/m. If this is out of the aformentioned range listed, please consider follow up with your Primary Care Provider.   __________________________________________________________  The [facility] providers would like to encourage you to use [facility] to communicate with providers for non-urgent requests or questions.  Due to long hold times on the telephone, sending your provider a message by [facility] may be a faster and more efficient way to get a response.  Please allow 48 business hours for a response.  Please remember that this is for non-urgent requests.   I appreciate the opportunity to care for you. Silvano Rusk, MD, [facility]

## 2021-08-05 NOTE — Progress Notes (Signed)
James Doyle 75 y.o. 18-Jan-1946 [id number]  Assessment & Plan:   Encounter Diagnosis  Name Primary?   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  I have explained that its not clear that he will need further routine colonoscopy given his metastatic colon cancer status.  I would certainly be happy to perform 1 if appropriate and needed and we will see what oncology says and I will be available.  Note that he could need an ultraslim colonoscope if repeat colonoscopy indicated based upon requirements of last colonoscopy in 2019.  I appreciate the opportunity to care for this patient. CC: James Doyle, James Sa, MD     Subjective:   Chief Complaint: Metastatic colon cancer, establish care  HPI 75 year old white man currently under the care of Dr. Reynaldo Doyle at [facility] for metastatic colon cancer as outlined below.  Previously seen and followed by Dr. Penelope Doyle of [facility] gastroenterology, Dr. Penelope Doyle has retired, the patient's son-in-law James Doyle and I worked together on [employer] at [employer] and he recommended the patient see me.  James Doyle daughter James Doyle is with him today.  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  Splenic flexure cancer was detected 5 years after a prior surveillance colonoscopy.  He did have a post colon cancer resection colonoscopy and an ultraslim colonoscope was required.  ASSESSMENT & PLAN  # Metastatic colon cancer. James Doyle was diagnosed with stage 3 adenocarcinoma of the splenic flexure in July 2018. He received adjuvant Xeloda-he declined oxaliplatin due to concern for side effects. In January 2021 he presented with small bowel obstruction. 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 He received first-line FOLFOX with partial response to treatment. Treatment was complicated by severe neuropathy. CT  on 03/12/2021 revealed progressive disease with new pulmonary nodules and liver metastases. Biopsy of a liver mass on 03/26/2021 confirmed metastatic adenocarcinoma consistent with a colonic primary.   He is receiving second line FOLFIRI/ bevacizumab. Due to poor tolerance of treatment we have made multiple dose reductions of irinotecan. With these dose reductions he is tolerating treatment better.  We will continue treatment with a goal of 6 months.  Plan/ James Doyle  Continue FOLFIRI/bev  Iri at 50% due to fatigue/ intolerance  RTC in 2 weeks with imaging  Peripheral neuropathy - duloxetine '30mg'$  and James Doyle. Seeing neurology 08/11/2021  ONCOLOGY HISTORY    06/08/17 - CT AP - Mass of the transverse colon near splenic flexure measuring up to 4 cm compatible with colonic adenocarcinoma. No lymphadenopathy by imaging criteria. Proximal dilatation of ascending and transverse colon with mild inflammatory changes likely due to obstruction. 4 mm right middle lobe pulmonary nodule, attention on follow-up recommended. 2 mm right kidney nonobstructing nephrolithiasis.   06/09/17 - CEA 1.8  06/09/17 - left colectomy end colostomy - Invasive adenocarcinoma, moderately differentiated (per report, grossly 5 cm and centered in the transverse colon), extending through the muscularis propria and the subserosal soft tissues to focally involve the serosal surface. Background colon with diverticulosis. Resection margins are negative for tumor. Metastatic adenocarcinoma involving two (2) of twenty-seven (27) lymph nodes. T3N1   07/20/17 - CT chest - No evidence of metastatic disease or other acute findings within the thorax.   08/05/17 - 11/2017 - Xeloda. Of note he was recommended including oxaliplatin with Xeloda but he declined for concerns of side effects  12/27/17 - CT AP- Interval partial colectomy with transverse colostomy and long Hartmann's pouch. No evidence of recurrent or metastatic disease. Too small to  characterize left hepatic lobe lesion, similar to on the prior exam. Ongoing stability favors a benign lesion such as a tiny cyst. This could be re-evaluated at follow-up. Left renal lesion which measures greater than fluid density but is decreased in size compared to the prior. This favors a complex cyst but is technically indeterminate. Recommend attention on follow-up. Aortic Atherosclerosis (ICD10-I70.0). Right nephrolithiasis.    07/13/18 - CT AP - No findings for residual or recurrent tumor, locoregional lymphadenopathy or metastatic disease elsewhere. Stable 5 mm left hepatic lobe lesion, likely benign cysts. Stable 22 mm lesion projecting off the anterior aspect of the left kidney. This is likely a complex cyst but recommend a precontrast sequence of the abdomen when the patient has is next follow-up CT scan. Stable enlarged prostate gland.   10/06/17 - CEA 3  01/30/18 - laparoscopic colostomy reversal path Skin and large bowel with ostomy site changes  07/17/19 - CT CAP - Stable exam. No new or progressive interval findings to suggest  recurrent or metastatic disease in the chest, abdomen, or pelvis. No change in the tiny hypodensity in the dome of the left liver compatible with benign etiology. 2.0 cm exophytic lesion in the left kidney measures stable to minimally smaller in the interval. As noted previously, this is probably a cyst complicated by proteinaceous debris or hemorrhage.   11/18/19 - CT AP - Multiple loops of mildly dilated and fluid-filled small bowel in the left hemidiaphragm with associated mild mesenteric edema, interloop fluid and a small amount of ascites extending upward into the perisplenic/subdiaphragmatic space. No transition point is evident. Differential considerations include infectious enteritis, partial small bowel obstruction, or intermittent small bowel obstruction. Surgical changes of prior sigmoid colectomy.   12/21/19 - CT AP - Small-bowel obstruction with transition  in the mid abdomen, likely secondary to adhesion. Mildly edematous small bowel wall with stranding of the adjacent mesentery and small amount of free fluid likely reactive to obstruction. Mild enteritis is not excluded. Mildly distended colon proximal to the sigmoid anastomosis. A degree of obstruction or mild narrowing at the level of the anastomosis is not excluded.   12/24/19 - small bowel resection - small bowel mass adherent to small bowel mesentery creating a fixation point  - Adenocarcinoma, moderately differentiated, 2.5 cm -  One tumor deposit (0.7 cm) -  Radial margin involved by carcinoma   02/02/20 - CEA 3.0  02/02/20 - CT CAP - Peritoneal carcinomatosis with increasing size of implants in the mesentery and along the left paracolic gutter. Enlarged lower right paraesophageal and right infrahilar lymph nodes, suspicious for metastasis. The right lower paraesophageal lymph node is increased in size since last exam. Left hemicolectomy. No evidence of bowel obstruction.    02/02/20 - 03/12/21 - FOLFOX, 25% dose reduction for cycle 3  03/01/20 - CEA 3.5  03/20/20 - CT CAP - Postsurgical changes of left colectomy. No bowel obstruction. Decrease in size of previously noted mesenteric mass now measuring 1.6 cm (previously measuring 2.4 cm). Decrease in peritoneal carcinomatosis and nodularity in the left paracolic gutter. Decrease in size of right hilar and right paraesophageal lymph node.   05/15/20 - CT CAP - Postsurgical changes following left hemicolectomy. Continued decrease in size of previously described mesenteric mass. Stable peritoneal nodule versus exophytic renal lesion anterior to the left kidney, unchanged from 11/18/2019. No evidence of  new metastatic disease.    07/11/20 - CT CAP - Status post left colectomy for colorectal cancer. Interval near complete resolution of mesenteric root mass with minimal residual. Oxaliplatin stopped due to neuropathy.  08/30/20 - MRI brain - No metastatic  disease or acute intracranial abnormality. Moderate for age nonspecific cerebral white matter signal changes, most commonly due to chronic small vessel disease. Generalized intracranial artery tortuosity.   09/19/20 - CT CAP - Postsurgical changes of left hemicolectomy and sigmoidectomy. Slight increase in a 4 mm right upper lobe nodule, recently measuring 2 mm. Attention on follow-up exam. No significant change in mesenteric root lesion. Soft tissue deposit versus exophytic left renal lesion, unchanged.   03/12/21 - CT CAP - New pulmonary nodules concerning for metastasis.  New small right pleural effusion.  New numerous liver lesions consistent with metastasis.  Mild splenomegaly unchanged. New/enlarged pericardiophrenic lymph node, subcarinal lymph nodes, retrocaval and perigastric lymph nodes. New peritoneal infiltration concerning for carcinomatosis. Possible subcutaneous abnormality, not grossly changed. New perisplenic perihepatic ascites.  New perihepatic nodule. CEA 9.1  03/26/21 - US biopsy liver - Metastatic adenocarcinoma consistent with patient's known colonic primary.   04/16/2021 - current FOLFIRI  04/16/21- C1 FOLFIRI iri '150mg'$ /m2. Tolerated poorly. CEA 10.6  05/01/21- C2 FOLFIRI iri '120mg'$ /m2 + bev  05/15/21- C3 FOLFIRI iri '90mg'$ /m2 + bev  05/28/2021 C4 FOLFIRI iri at '90mg'$ /m2 + bev  06/12/2021 CT C/A/P notes overall stable disease. CEA 9.7  06/12/21 c5 FOLFIRI iri at '90mg'$ /m2 + bev  06/16/2021 C6 FOLFIRI iri at '90mg'$ /m2 + bev  06/25/21 c7 FOLFIRI iri at '90mg'$ /m2 + bev  07/10/21 c8 FOLFIRI iri at '90mg'$ /m2 + bev  07/24/21 c9 FOLFIRI iri at '90mg'$ /m2 + bev      Not on File Current Meds  Medication Sig   brimonidine (ALPHAGAN) 0.2 % ophthalmic solution Place 1 drop into both eyes 2 (two) times daily.    dexamethasone (DECADRON) 4 MG tablet TAKE TWO TABLETS BY MOUTH ONCE A DAY ON DAYS 2, 3, 16 AND 17 OF EACH CYCLE   dorzolamide-timolol (COSOPT) 22.3-6.8 MG/ML ophthalmic solution Place 1  drop into both eyes daily.   DULoxetine (CYMBALTA) 30 MG capsule Take by mouth.   lidocaine-prilocaine (EMLA) cream Apply a small amount to port site and cover with a bandage 30-60 minutes prior to port access   loperamide (IMODIUM) 2 MG capsule TAKE TWO CAPSULES BY MOUTH AT ONSET OF DIARRHEA, THEN ONE CAPSULE EVERY 2 HOURS (TWO CAPSULES EVERY 4 HOURS WHILE ASLEEP) AROUND THE CLOCK UNTIL DIARRHEA-FREE FOR 12 HOURS   ondansetron (ZOFRAN) 8 MG tablet Take 1 tablet (8 mg total) by mouth 2 (two) times daily as needed for refractory nausea / vomiting. Start on day 3 after chemotherapy.   polyethylene glycol (MIRALAX / GLYCOLAX) 17 g packet Take 17 g by mouth daily as needed for mild constipation. Hold for diarrhea   pregabalin (James Doyle) 100 MG capsule Take 100 mg by mouth 2 (two) times daily.   prochlorperazine (COMPAZINE) 10 MG tablet Take 1 tablet (10 mg total) by mouth every 6 (six) hours as needed (Nausea or vomiting).   Past Medical History:  Diagnosis Date   Adenomatous polyps 03/17/2007   Aortic atherosclerosis ([place]) 12/27/2017   Noted on CT abd/pelvis   Arthritis    Cancer of left colon ([place]) 06/08/2017   Cellulitis of left hand    Chronic back pain    Chronic pain of right knee    Chronic right hip pain    Colon  obstruction s/p colectomy/colostomy 06/09/2017 06/12/2017   DDD (degenerative disc disease), lumbar    H/O vocal cord paralysis    left    History of hypokalemia    History of thumb fracture    Left   Left nephrolithiasis 12/27/2017   Noted on CT abd/pelvis   Past Surgical History:  Procedure Laterality Date   CERVICAL DISC SURGERY  2007   COLON RESECTION     COLONOSCOPY  03/2007   with polyp resection   COLOSTOMY     COLOSTOMY TAKEDOWN N/A 02/09/2018   Procedure: LAPAROSCOPIC COLOSTOMY REVERSAL ERAS PATHWAY;  Surgeon: Leighton Ruff, MD;  Location: [facility];  Service: General;  Laterality: N/A;   INNER EAR SURGERY     bilat   LAPAROSCOPY N/A 06/09/2017   Procedure:  LAPAROSCOPY DIAGNOSTIC, OPEN  LEFT COLECTOMY;  Surgeon: Leighton Ruff, MD;  Location: [facility];  Service: General;  Laterality: N/A;   LAPAROSCOPY N/A 12/24/2019   Procedure: LAPAROSCOPY DIAGNOSTIC lysis of adhesions;  Surgeon: Leighton Ruff, MD;  Location: [facility];  Service: General;  Laterality: N/A;   LAPAROTOMY N/A 12/24/2019   Procedure: small bowel resection;  Surgeon: Leighton Ruff, MD;  Location: [facility];  Service: General;  Laterality: N/A;   [place]     right great saphenous laser ablation  2009   TONSILLECTOMY     TOTAL KNEE ARTHROPLASTY Right 07/27/2016   Procedure: RIGHT TOTAL KNEE ARTHROPLASTY;  Surgeon: Gaynelle Arabian, MD;  Location: [facility];  Service: Orthopedics;  Laterality: Right;   vocal cord surgery  2007   Social History   Social History Narrative   Patient is married, 1 daughter Tammy   Retired Environmental manager, 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   Review of Systems neuropathy  Objective:   Physical Exam BP 102/60   Pulse 68   Ht '6\' 1"'$  (1.854 m)   Wt 230 lb (104.3 kg)   SpO2 97%   BMI 30.34 kg/m  Obese, elderly wm NAD Lungs cta Cor NL S2 no rmg Abd somewhat obese, soft, NT, well-healed surgical scars no hernias BS + Alert and oriented x 3 Gait slow, somewhat unsteady, uses cane

## 2022-04-06 ENCOUNTER — Emergency Department (HOSPITAL_COMMUNITY): Payer: Medicare Other

## 2022-04-06 ENCOUNTER — Inpatient Hospital Stay (HOSPITAL_COMMUNITY)
Admission: EM | Admit: 2022-04-06 | Discharge: 2022-04-08 | DRG: 180 | Disposition: A | Discharge status: Home Health | Payer: Medicare Other | Attending: Internal Medicine | Admitting: Internal Medicine

## 2022-04-06 ENCOUNTER — Other Ambulatory Visit: Payer: Self-pay

## 2022-04-06 ENCOUNTER — Encounter (HOSPITAL_COMMUNITY): Payer: Self-pay

## 2022-04-06 DIAGNOSIS — Z7189 Other specified counseling: Secondary | ICD-10-CM | POA: Diagnosis not present

## 2022-04-06 DIAGNOSIS — F0393 Unspecified dementia, unspecified severity, with mood disturbance: Secondary | ICD-10-CM | POA: Diagnosis present

## 2022-04-06 DIAGNOSIS — Z833 Family history of diabetes mellitus: Secondary | ICD-10-CM

## 2022-04-06 DIAGNOSIS — Z96651 Presence of right artificial knee joint: Secondary | ICD-10-CM | POA: Diagnosis present

## 2022-04-06 DIAGNOSIS — I447 Left bundle-branch block, unspecified: Secondary | ICD-10-CM | POA: Diagnosis present

## 2022-04-06 DIAGNOSIS — J9601 Acute respiratory failure with hypoxia: Secondary | ICD-10-CM | POA: Diagnosis present

## 2022-04-06 DIAGNOSIS — R0902 Hypoxemia: Secondary | ICD-10-CM

## 2022-04-06 DIAGNOSIS — C186 Malignant neoplasm of descending colon: Secondary | ICD-10-CM | POA: Diagnosis not present

## 2022-04-06 DIAGNOSIS — C787 Secondary malignant neoplasm of liver and intrahepatic bile duct: Secondary | ICD-10-CM | POA: Diagnosis present

## 2022-04-06 DIAGNOSIS — Z823 Family history of stroke: Secondary | ICD-10-CM | POA: Diagnosis not present

## 2022-04-06 DIAGNOSIS — C7801 Secondary malignant neoplasm of right lung: Secondary | ICD-10-CM | POA: Diagnosis present

## 2022-04-06 DIAGNOSIS — I5031 Acute diastolic (congestive) heart failure: Secondary | ICD-10-CM | POA: Diagnosis present

## 2022-04-06 DIAGNOSIS — G9341 Metabolic encephalopathy: Secondary | ICD-10-CM | POA: Diagnosis present

## 2022-04-06 DIAGNOSIS — G250 Essential tremor: Secondary | ICD-10-CM | POA: Diagnosis present

## 2022-04-06 DIAGNOSIS — E860 Dehydration: Secondary | ICD-10-CM | POA: Diagnosis present

## 2022-04-06 DIAGNOSIS — G9340 Encephalopathy, unspecified: Secondary | ICD-10-CM

## 2022-04-06 DIAGNOSIS — Z85038 Personal history of other malignant neoplasm of large intestine: Secondary | ICD-10-CM

## 2022-04-06 DIAGNOSIS — R001 Bradycardia, unspecified: Secondary | ICD-10-CM | POA: Diagnosis present

## 2022-04-06 DIAGNOSIS — J910 Malignant pleural effusion: Secondary | ICD-10-CM | POA: Diagnosis present

## 2022-04-06 DIAGNOSIS — I700 Atherosclerosis of aorta: Secondary | ICD-10-CM | POA: Diagnosis present

## 2022-04-06 DIAGNOSIS — J690 Pneumonitis due to inhalation of food and vomit: Secondary | ICD-10-CM | POA: Diagnosis present

## 2022-04-06 DIAGNOSIS — Z8249 Family history of ischemic heart disease and other diseases of the circulatory system: Secondary | ICD-10-CM | POA: Diagnosis not present

## 2022-04-06 DIAGNOSIS — J90 Pleural effusion, not elsewhere classified: Secondary | ICD-10-CM | POA: Diagnosis present

## 2022-04-06 DIAGNOSIS — Z981 Arthrodesis status: Secondary | ICD-10-CM

## 2022-04-06 DIAGNOSIS — Z79899 Other long term (current) drug therapy: Secondary | ICD-10-CM

## 2022-04-06 DIAGNOSIS — R4182 Altered mental status, unspecified: Secondary | ICD-10-CM | POA: Diagnosis present

## 2022-04-06 LAB — URINALYSIS, ROUTINE W REFLEX MICROSCOPIC
Bilirubin Urine: NEGATIVE
Glucose, UA: NEGATIVE mg/dL
Hgb urine dipstick: NEGATIVE
Ketones, ur: NEGATIVE mg/dL
Leukocytes,Ua: NEGATIVE
Nitrite: NEGATIVE
Protein, ur: NEGATIVE mg/dL
Specific Gravity, Urine: 1.029 (ref 1.005–1.030)
pH: 5 (ref 5.0–8.0)

## 2022-04-06 LAB — CBC
HCT: 40.2 % (ref 39.0–52.0)
Hemoglobin: 12.6 g/dL — ABNORMAL LOW (ref 13.0–17.0)
MCH: 28.1 pg (ref 26.0–34.0)
MCHC: 31.3 g/dL (ref 30.0–36.0)
MCV: 89.5 fL (ref 80.0–100.0)
Platelets: 322 10*3/uL (ref 150–400)
RBC: 4.49 MIL/uL (ref 4.22–5.81)
RDW: 14.7 % (ref 11.5–15.5)
WBC: 15.2 10*3/uL — ABNORMAL HIGH (ref 4.0–10.5)
nRBC: 0 % (ref 0.0–0.2)

## 2022-04-06 LAB — COMPREHENSIVE METABOLIC PANEL
ALT: 31 U/L (ref 0–44)
AST: 65 U/L — ABNORMAL HIGH (ref 15–41)
Albumin: 2.4 g/dL — ABNORMAL LOW (ref 3.5–5.0)
Alkaline Phosphatase: 431 U/L — ABNORMAL HIGH (ref 38–126)
Anion gap: 9 (ref 5–15)
BUN: 16 mg/dL (ref 8–23)
CO2: 25 mmol/L (ref 22–32)
Calcium: 8.8 mg/dL — ABNORMAL LOW (ref 8.9–10.3)
Chloride: 103 mmol/L (ref 98–111)
Creatinine, Ser: 1.06 mg/dL (ref 0.61–1.24)
GFR, Estimated: >60 mL/min (ref >60)
Glucose, Bld: 126 mg/dL — ABNORMAL HIGH (ref 70–99)
Potassium: 4.9 mmol/L (ref 3.5–5.1)
Sodium: 137 mmol/L (ref 135–145)
Total Bilirubin: 0.7 mg/dL (ref 0.3–1.2)
Total Protein: 5.9 g/dL — ABNORMAL LOW (ref 6.5–8.1)

## 2022-04-06 LAB — I-STAT VENOUS BLOOD GAS, ED
Acid-Base Excess: 5 mmol/L — ABNORMAL HIGH (ref 0.0–2.0)
Bicarbonate: 30 mmol/L — ABNORMAL HIGH (ref 20.0–28.0)
Calcium, Ion: 1.18 mmol/L (ref 1.15–1.40)
HCT: 40 % (ref 39.0–52.0)
Hemoglobin: 13.6 g/dL (ref 13.0–17.0)
O2 Saturation: 98 %
Potassium: 4.3 mmol/L (ref 3.5–5.1)
Sodium: 134 mmol/L — ABNORMAL LOW (ref 135–145)
TCO2: 31 mmol/L (ref 22–32)
pCO2, Ven: 45 mmHg (ref 44–60)
pH, Ven: 7.432 — ABNORMAL HIGH (ref 7.25–7.43)
pO2, Ven: 108 mmHg — ABNORMAL HIGH (ref 32–45)

## 2022-04-06 LAB — PROTIME-INR
INR: 1.1 (ref 0.8–1.2)
Prothrombin Time: 14.5 seconds (ref 11.4–15.2)

## 2022-04-06 LAB — LACTIC ACID, PLASMA: Lactic Acid, Venous: 1.9 mmol/L (ref 0.5–1.9)

## 2022-04-06 LAB — LIPASE, BLOOD: Lipase: 50 U/L (ref 11–51)

## 2022-04-06 LAB — PROCALCITONIN: Procalcitonin: 0.58 ng/mL

## 2022-04-06 LAB — AMMONIA: Ammonia: 25 umol/L (ref 9–35)

## 2022-04-06 MED ORDER — PREGABALIN 100 MG PO CAPS
100.0000 mg | ORAL_CAPSULE | Freq: Two times a day (BID) | ORAL | Status: DC
  Administered 2022-04-06: 100 mg via ORAL
  Filled 2022-04-06 (×2): qty 1

## 2022-04-06 MED ORDER — ACETAMINOPHEN 325 MG PO TABS: 650.0000 mg | ORAL_TABLET | Freq: Four times a day (QID) | ORAL | Status: DC | PRN

## 2022-04-06 MED ORDER — DULOXETINE HCL 30 MG PO CPEP
30.0000 mg | ORAL_CAPSULE | Freq: Every day | ORAL | Status: DC
  Administered 2022-04-06: 30 mg via ORAL
  Filled 2022-04-06 (×4): qty 1

## 2022-04-06 MED ORDER — POLYETHYLENE GLYCOL 3350 17 G PO PACK: 17.0000 g | PACK | Freq: Every day | ORAL | Status: DC | PRN

## 2022-04-06 MED ORDER — SODIUM CHLORIDE 0.9 % IV SOLN
1.0000 g | INTRAVENOUS | Status: DC
  Administered 2022-04-06: 1 g via INTRAVENOUS
  Filled 2022-04-06: qty 10

## 2022-04-06 MED ORDER — IOHEXOL 350 MG/ML SOLN
150.0000 mL | Freq: Once | INTRAVENOUS | Status: AC | PRN
  Administered 2022-04-06: 150 mL via INTRAVENOUS

## 2022-04-06 MED ORDER — PROCHLORPERAZINE MALEATE 5 MG PO TABS: 10.0000 mg | ORAL_TABLET | Freq: Four times a day (QID) | ORAL | Status: DC | PRN

## 2022-04-06 MED ORDER — ONDANSETRON HCL 4 MG PO TABS: 8.0000 mg | ORAL_TABLET | Freq: Two times a day (BID) | ORAL | Status: DC | PRN

## 2022-04-06 MED ORDER — BRIMONIDINE TARTRATE 0.2 % OP SOLN: 1.0000 [drp] | Freq: Two times a day (BID) | OPHTHALMIC | Status: DC

## 2022-04-06 MED ORDER — DORZOLAMIDE HCL-TIMOLOL MAL 2-0.5 % OP SOLN
1.0000 [drp] | Freq: Two times a day (BID) | OPHTHALMIC | Status: DC
  Administered 2022-04-06 – 2022-04-08 (×4): 1 [drp] via OPHTHALMIC
  Filled 2022-04-06: qty 10

## 2022-04-06 MED ORDER — ENOXAPARIN SODIUM 40 MG/0.4ML IJ SOSY
40.0000 mg | PREFILLED_SYRINGE | INTRAMUSCULAR | Status: DC
  Administered 2022-04-06 – 2022-04-07 (×2): 40 mg via SUBCUTANEOUS
  Filled 2022-04-06 (×2): qty 0.4

## 2022-04-06 NOTE — H&P (Signed)
?History and Physical  ? ? ?James Doyle OIN:[id number] DOB: [birth year] DOA: 04/06/2022 ? ?PCP: Clelia Croft, MD (Confirm with patient/family/NH records and if not entered, this has to be entered at [facility] point of entry) ?Patient coming from: Home ? ?I have personally briefly reviewed patient's old medical records in [place] ? ?Chief Complaint: AMS ? ?HPI: James Doyle is a 76 y.o. male with medical history significant of stage III metastatic colon cancer to liver and right side of the lung, on palliative chemical/immune therapy, early dementia/memory loss, obesity, presented with mentation changes. ? ?Patient very sleepy and confused, unable to provide any history, all history provided by patient daughter/ at bedside.  Patient has very metastatic colon cancer to liver and rest of the block and new onset of right-sided pleural effusion likely malignant, and underwent thoracentesis for the first time on April 18 at [facility] oncology which removed about 1 L of fluid.  Formal report pending.  ? ?Daughter reports that the patient was recently started a new immunotherapy medication for the second round in 6 weeks.  And has been having frequent daytime sleepiness, and worsening of memory loss in the recent 2 to 3 weeks.  For this reason, the immune therapy has been held since last week.  Despite, patient continued to show frequent episodes of daytime sleepiness and confusion.  Meantime, patient has had a bilateral arm and hand tremors since September last year, for which his neurology diagnosed him with essential tremor and has started him on propanolol 60 mg daily, and increased to 120 mg daily for the last 2 weeks.  Family also noticed patient has had " Deep and dry cough" for about 2 weeks, no fever noticed as per family. ? ?Patient woke up at around 9:30 and took breakfast then went to sleep around 10 o'clock. Around 11:00, patient was found hard to arouse, daughter noted patient color turned blue and cold and  clammy to felt.  She may recheck, SBP 110s.  Too high for did not check pulses or pulse ox.  EMS was called immediately, and EMS arrived found patient O2 saturation 89% on room air in the 80s. ? ?ED Course:  ? ?Vital signs stable, borderline bradycardia, blood pressure stable, saturation 98% on 2 L.  X-ray showed right-sided pleural effusion, CT angiogram negative for PE but large right-sided pleural effusion. ? ? ? ?Review of Systems: Unable to perform, patient confused ? ?Past Medical History:  ?Diagnosis Date  ? Adenomatous polyps 03/17/2007  ? Aortic atherosclerosis ([place]) 12/27/2017  ? Noted on CT abd/pelvis  ? Arthritis   ? Cancer of left colon ([place]) 06/08/2017  ? Cellulitis of left hand   ? Chronic back pain   ? Chronic pain of right knee   ? Chronic right hip pain   ? Colon obstruction s/p colectomy/colostomy 06/09/2017 06/12/2017  ? DDD (degenerative disc disease), lumbar   ? H/O vocal cord paralysis   ? left   ? History of hypokalemia   ? History of thumb fracture   ? Left  ? Left nephrolithiasis 12/27/2017  ? Noted on CT abd/pelvis  ? ? ?Past Surgical History:  ?Procedure Laterality Date  ? [place] SURGERY  2007  ? COLON RESECTION    ? COLONOSCOPY  03/2007  ? with polyp resection  ? COLOSTOMY    ? COLOSTOMY TAKEDOWN N/A 02/09/2018  ? Procedure: LAPAROSCOPIC COLOSTOMY REVERSAL ERAS PATHWAY;  Surgeon: Leighton Ruff, MD;  [facility]: [facility];  Service: General;  Laterality: N/A;  ? INNER EAR SURGERY    ? bilat  ? LAPAROSCOPY N/A 06/09/2017  ? Procedure: LAPAROSCOPY DIAGNOSTIC, OPEN  LEFT COLECTOMY;  Surgeon: Leighton Ruff, MD;  Location: [facility];  Service: General;  Laterality: N/A;  ? LAPAROSCOPY N/A 12/24/2019  ? Procedure: LAPAROSCOPY DIAGNOSTIC lysis of adhesions;  Surgeon: Leighton Ruff, MD;  Location: [facility];  Service: General;  Laterality: N/A;  ? LAPAROTOMY N/A 12/24/2019  ? Procedure: small bowel resection;  Surgeon: Leighton Ruff, MD;  Location: [facility];  Service: General;  Laterality: N/A;  ?  [place]  ? PROSTATE BIOPSY    ? right great saphenous laser ablation  2009  ? TONSILLECTOMY    ? TOTAL KNEE ARTHROPLASTY Right 07/27/2016  ? Procedure: RIGHT TOTAL KNEE ARTHROPLASTY;  Surgeon: Gaynelle Arabian, MD;  Location: [facility];  Service: Orthopedics;  Laterality: Right;  ? vocal cord surgery  2007  ? ? ? reports that he has never smoked. He has never used smokeless tobacco. He reports that he does not drink alcohol and does not use drugs. ? ?Not on File ? ?Family History  ?Problem Relation Age of Onset  ? Heart failure Mother   ? Stroke Father   ? Diabetes Brother   ? Congestive Heart Failure Brother   ? Cancer Maternal Uncle   ?     colon cancer  ? Colon cancer Neg Hx   ? Liver disease Neg Hx   ? Pancreatic cancer Neg Hx   ? Esophageal cancer Neg Hx   ? ? ? ?Prior to Admission medications   ?Medication Sig Start Date End Date Taking? Authorizing Provider  ?acetaminophen (TYLENOL) 500 MG tablet Take by mouth.    [provider]  ?brimonidine (ALPHAGAN) 0.2 % ophthalmic solution Place 1 drop into both eyes 2 (two) times daily.  11/15/19   [provider]  ?dorzolamide-timolol (COSOPT) 22.3-6.8 MG/ML ophthalmic solution Place 1 drop into both eyes daily. 12/15/19   [provider]  ?DULoxetine (CYMBALTA) 30 MG capsule Take by mouth. 07/10/21 07/10/22  [provider]  ?lidocaine-prilocaine (EMLA) cream Apply a small amount to port site and cover with a bandage 30-60 minutes prior to port access 09/25/20   [provider]  ?ondansetron (ZOFRAN) 8 MG tablet Take 1 tablet (8 mg total) by mouth 2 (two) times daily as needed for refractory nausea / vomiting. Start on day 3 after chemotherapy. 01/14/20   Truitt Merle, MD  ?polyethylene glycol (MIRALAX / GLYCOLAX) 17 g packet Take 17 g by mouth daily as needed for mild constipation. Hold for diarrhea 12/28/19   Norm Parcel, PA-C  ?pregabalin (LYRICA) 100 MG capsule Take 100 mg by mouth 2 (two) times daily.    [provider]  ?prochlorperazine (COMPAZINE) 10 MG tablet Take 1 tablet (10 mg total) by mouth every 6 (six) hours as needed (Nausea or vomiting). 01/14/20   Truitt Merle, MD  ? ? ?Physical Exam: ?Vitals:  ? 04/06/22 1730 04/06/22 1845 04/06/22 1846 04/06/22 1900  ?BP: 136/89 (!) 146/85  138/83  ?Pulse: 61 62 63 62  ?Resp: 15 (!) '25 14 17  '$ ?Temp:      ?SpO2: 100% 98% 94% 96%  ? ? ?Constitutional: NAD, calm, comfortable ?Vitals:  ? 04/06/22 1730 04/06/22 1845 04/06/22 1846 04/06/22 1900  ?BP: 136/89 (!) 146/85  138/83  ?Pulse: 61 62 63 62  ?Resp: 15 (!) '25 14 17  '$ ?Temp:      ?SpO2: 100% 98%  94% 96%  ? ?Eyes: PERRL, lids and conjunctivae normal ?ENMT: Mucous membranes are dry. Posterior pharynx clear of any exudate or lesions.Normal dentition.  ?Neck: normal, supple, no masses, no thyromegaly ?Respiratory: Diminished breathing sound right side, no wheezing, no crackles. Normal respiratory effort. No accessory muscle use.  ?Cardiovascular: Regular rate and rhythm, no murmurs / rubs / gallops. No extremity edema. 2+ pedal pulses. No carotid bruits.  ?Abdomen: no tenderness, no masses palpated. No hepatosplenomegaly. Bowel sounds positive.  ?Musculoskeletal: no clubbing / cyanosis. No joint deformity upper and lower extremities. Good ROM, no contractures. Normal muscle tone.  ?Skin: no rashes, lesions, ulcers. No induration ?Neurologic: No facial droops, moving all limbs, not following commands ?Psychiatric: Arousable, lethargic, oriented to himself, confused about time and place ? ? ?Labs on Admission: I have personally reviewed following labs and imaging studies ? ?CBC: ?Recent Labs  ?Lab 04/06/22 ?1416  ?WBC 15.2*  ?HGB 12.6*  ?HCT 40.2  ?MCV 89.5  ?PLT 322  ? ?Basic Metabolic Panel: ?Recent Labs  ?Lab 04/06/22 ?1416  ?NA 137  ?K 4.9  ?CL 103  ?CO2 25  ?GLUCOSE 126*  ?BUN 16  ?CREATININE 1.06  ?CALCIUM 8.8*  ? ?GFR: ?CrCl cannot be calculated (Unknown ideal weight.). ?Liver Function Tests: ?Recent Labs  ?Lab  04/06/22 ?1416  ?AST 65*  ?ALT 31  ?ALKPHOS 431*  ?BILITOT 0.7  ?PROT 5.9*  ?ALBUMIN 2.4*  ? ?Recent Labs  ?Lab 04/06/22 ?1416  ?LIPASE 50  ? ?No results for input(s): AMMONIA in the last 168 hours. ?Coagulation P

## 2022-04-06 NOTE — ED Provider Notes (Signed)
?[facility] ?Provider Note ? ? ?CSN: [id number] ?Arrival date & time: 04/06/22  1305 ? ?  ? ?History ? ?Chief Complaint  ?Patient presents with  ? Loss of Consciousness  ? ? ?James Doyle is a 76 y.o. 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  At this time patient is alert and oriented x4.  Additionally patient was found with oxygen saturation 84%, he endorses feeling short of breath.  He is not normally on oxygen at home.  Daughter reports that he did have a fluid collection drained from his right lobe a couple of weeks ago.  He is on chemotherapy reports last chemotherapy was around 2 weeks ago or more.  He denies any chest pain, shortness of breath, feeling of heart palpitations, denies any weakness, numbness in legs or arms. Daughter endorses new cough, pale, clammy appearance over the last few days. ? ? ?Loss of Consciousness ?Associated symptoms: confusion and weakness   ? ?  ? ?Home Medications ?Prior to Admission medications   ?Medication Sig Start Date End Date Taking? Authorizing Provider  ?acetaminophen (TYLENOL) 500 MG tablet Take by mouth.    [provider]  ?brimonidine (ALPHAGAN) 0.2 % ophthalmic solution Place 1 drop into both eyes 2 (two) times daily.  11/15/19   [provider]  ?dorzolamide-timolol (COSOPT) 22.3-6.8 MG/ML ophthalmic solution Place 1 drop into both eyes daily. 12/15/19   [provider]  ?DULoxetine (CYMBALTA) 30 MG capsule Take by mouth. 07/10/21 07/10/22  [provider]  ?lidocaine-prilocaine (EMLA) cream Apply a small amount to port site and cover with a bandage 30-60 minutes prior to port access 09/25/20   [provider]  ?ondansetron (ZOFRAN) 8 MG tablet Take 1 tablet (8 mg total) by mouth 2 (two) times daily as needed for refractory nausea /  vomiting. Start on day 3 after chemotherapy. 01/14/20   Truitt Merle, MD  ?polyethylene glycol (MIRALAX / GLYCOLAX) 17 g packet Take 17 g by mouth daily as needed for mild constipation. Hold for diarrhea 12/28/19   Norm Parcel, PA-C  ?pregabalin (LYRICA) 100 MG capsule Take 100 mg by mouth 2 (two) times daily.    [provider]  ?prochlorperazine (COMPAZINE) 10 MG tablet Take 1 tablet (10 mg total) by mouth every 6 (six) hours as needed (Nausea or vomiting). 01/14/20   Truitt Merle, MD  ?   ? ?Allergies    ?Patient has no allergy information on record.   ? ?Review of Systems   ?Review of Systems  ?Cardiovascular:  Positive for syncope.  ?Neurological:  Positive for syncope, speech difficulty and weakness.  ?Psychiatric/Behavioral:  Positive for confusion.   ?All other systems reviewed and are negative. ? ?Physical Exam ?Updated Vital Signs ?BP 136/89   Pulse 61   Temp 97.9 ?F (36.6 ?C)   Resp 15   SpO2 100%  ?Physical Exam ?Vitals and nursing note reviewed.  ?Constitutional:   ?   General: He is not in acute distress. ?   Appearance: Normal appearance. He is ill-appearing.  ?HENT:  ?   Head: Normocephalic and atraumatic.  ?Eyes:  ?   General: Scleral icterus present.     ?   Right eye: No discharge.     ?   Left eye: No discharge.  ?Cardiovascular:  ?   Rate and Rhythm:  Normal rate and regular rhythm.  ?   Heart sounds: No murmur heard. ?  No friction rub. No gallop.  ?Pulmonary:  ?   Effort: Pulmonary effort is normal.  ?   Breath sounds: Normal breath sounds.  ?   Comments: Some decreased respiratory effort, scattered crackles, rhonchi sounds noted bilaterally, right greater than left, no focal consolidation noted, wet cough noted on exam ?Abdominal:  ?   General: Bowel sounds are normal.  ?   Palpations: Abdomen is soft.  ?   Comments: No significant tenderness to palpation  ?Skin: ?   General: Skin is warm and dry.  ?   Capillary Refill: Capillary refill takes less than 2 seconds.  ?Neurological:  ?    Mental Status: He is alert and oriented to person, place, and time.  ?Psychiatric:     ?   Mood and Affect: Mood normal.     ?   Behavior: Behavior normal.  ? ? ?ED Results / Procedures / Treatments   ?Labs ?(all labs ordered are listed, but only abnormal results are displayed) ?Labs Reviewed  ?CBC - Abnormal; Notable for the following components:  ?    Result Value  ? WBC 15.2 (*)   ? Hemoglobin 12.6 (*)   ? All other components within normal limits  ?COMPREHENSIVE METABOLIC PANEL - Abnormal; Notable for the following components:  ? Glucose, Bld 126 (*)   ? Calcium 8.8 (*)   ? Total Protein 5.9 (*)   ? Albumin 2.4 (*)   ? AST 65 (*)   ? Alkaline Phosphatase 431 (*)   ? All other components within normal limits  ?URINALYSIS, ROUTINE W REFLEX MICROSCOPIC - Abnormal; Notable for the following components:  ? Color, Urine AMBER (*)   ? All other components within normal limits  ?LIPASE, BLOOD  ?LACTIC ACID, PLASMA  ? ? ?EKG ?EKG Interpretation ? ?Date/Time:  Monday Apr 06 2022 13:29:14 EDT ?Ventricular Rate:  59 ?PR Interval:  170 ?QRS Duration: 151 ?QT Interval:  444 ?QTC Calculation: 440 ?R Axis:   -13 ?Text Interpretation: Sinus rhythm Left bundle branch block Confirmed by Regan Lemming ([id number]) on 04/06/2022 3:13:48 PM ? ?Radiology ?DG Chest 2 View ? ?Result Date: 04/06/2022 ?CLINICAL DATA:  Shortness of breath. EXAM: CHEST - 2 VIEW COMPARISON:  Chest radiograph December 16, 2005 and chest CT July 20, 2017 FINDINGS: Right chest wall Port-A-Cath with tip overlying right atrium. The heart size and mediastinal contours are partially obscured but the cardiac silhouette appears slightly enlarged. Moderate right-sided pleural effusion with adjacent airspace consolidation. Low left lung volume with streaky opacities in the left lung base. Prior median sternotomy with fracture of the inferior most sternotomy wire unchanged from prior. IMPRESSION: 1. Moderate right-sided pleural effusion with adjacent airspace consolidation which  may reflect atelectasis or infiltrate. 2. Streaky opacities in the left lung base likely atelectasis. Electronically Signed   By: Dahlia Bailiff M.D.   On: 04/06/2022 13:52  ? ?CT Head Wo Contrast ? ?Result Date: 04/06/2022 ?CLINICAL DATA:  Mental status change, unknown cause EXAM: CT HEAD WITHOUT CONTRAST TECHNIQUE: Contiguous axial images were obtained from the base of the skull through the vertex without intravenous contrast. RADIATION DOSE REDUCTION: This exam was performed according to the departmental dose-optimization program which includes automated exposure control, adjustment of the mA and/or kV according to patient size and/or use of iterative reconstruction technique. COMPARISON:  MRI head August 30, 2020. FINDINGS: Brain: No evidence of acute infarction, hemorrhage, hydrocephalus, extra-axial  collection or mass lesion/mass effect. Moderate to advanced patchy white matter hypodensities, nonspecific but compatible with chronic microvascular ischemic disease. Cerebral atrophy. Areas of dural calcification. Vascular: Intracranial atherosclerosis. No hyperdense vessel identified. Skull: No acute fracture. Sinuses/Orbits: Clear sinuses.  No acute orbital findings. Other: No mastoid effusions. IMPRESSION: 1. No evidence of acute intracranial abnormality. 2. Chronic microvascular ischemic disease and cerebral atrophy (ICD10-G31.9). Electronically Signed   By: Margaretha Sheffield M.D.   On: 04/06/2022 14:28  ? ?CT Angio Chest PE W and/or Wo Contrast ? ?Result Date: 04/06/2022 ?CLINICAL DATA:  Pulmonary embolism (PE) suspected, high prob EXAM: CT ANGIOGRAPHY CHEST WITH CONTRAST TECHNIQUE: Multidetector CT imaging of the chest was performed using the standard protocol during bolus administration of intravenous contrast. Multiplanar CT image reconstructions and MIPs were obtained to evaluate the vascular anatomy. RADIATION DOSE REDUCTION: This exam was performed according to the departmental dose-optimization program  which includes automated exposure control, adjustment of the mA and/or kV according to patient size and/or use of iterative reconstruction technique. CONTRAST:  141m OMNIPAQUE IOHEXOL 350 MG/ML SOLN COMPA

## 2022-04-06 NOTE — ED Triage Notes (Signed)
Pt BIB [facility] from  home. Family found the pt unconscious on the couch, unable to wake him up. Pt was Conscious but not complete alert upon EMS arrival. Sat was at 84%, 2L applied by EMS. Colon cancer pt.  ? ?Baseline A&O X4. Not on oxygen.  ?

## 2022-04-07 ENCOUNTER — Inpatient Hospital Stay (HOSPITAL_COMMUNITY): Payer: Medicare Other

## 2022-04-07 DIAGNOSIS — Z7189 Other specified counseling: Secondary | ICD-10-CM

## 2022-04-07 DIAGNOSIS — J90 Pleural effusion, not elsewhere classified: Secondary | ICD-10-CM | POA: Diagnosis not present

## 2022-04-07 DIAGNOSIS — G9340 Encephalopathy, unspecified: Secondary | ICD-10-CM | POA: Diagnosis not present

## 2022-04-07 DIAGNOSIS — C186 Malignant neoplasm of descending colon: Secondary | ICD-10-CM | POA: Diagnosis not present

## 2022-04-07 HISTORY — PX: IR THORACENTESIS ASP PLEURAL SPACE W/IMG GUIDE: IMG5380

## 2022-04-07 LAB — LIPID PANEL
Cholesterol: 244 mg/dL — ABNORMAL HIGH (ref 0–200)
HDL: 30 mg/dL — ABNORMAL LOW (ref >40)
LDL Cholesterol: 186 mg/dL — ABNORMAL HIGH (ref 0–99)
Total CHOL/HDL Ratio: 8.1 RATIO
Triglycerides: 140 mg/dL (ref <150)
VLDL: 28 mg/dL (ref 0–40)

## 2022-04-07 LAB — COMPREHENSIVE METABOLIC PANEL
ALT: 34 U/L (ref 0–44)
AST: 52 U/L — ABNORMAL HIGH (ref 15–41)
Albumin: 2.3 g/dL — ABNORMAL LOW (ref 3.5–5.0)
Alkaline Phosphatase: 447 U/L — ABNORMAL HIGH (ref 38–126)
Anion gap: 9 (ref 5–15)
BUN: 12 mg/dL (ref 8–23)
CO2: 28 mmol/L (ref 22–32)
Calcium: 9.1 mg/dL (ref 8.9–10.3)
Chloride: 99 mmol/L (ref 98–111)
Creatinine, Ser: 1.05 mg/dL (ref 0.61–1.24)
GFR, Estimated: >60 mL/min (ref >60)
Glucose, Bld: 116 mg/dL — ABNORMAL HIGH (ref 70–99)
Potassium: 4.3 mmol/L (ref 3.5–5.1)
Sodium: 136 mmol/L (ref 135–145)
Total Bilirubin: 0.8 mg/dL (ref 0.3–1.2)
Total Protein: 6 g/dL — ABNORMAL LOW (ref 6.5–8.1)

## 2022-04-07 LAB — CBC
HCT: 41.2 % (ref 39.0–52.0)
Hemoglobin: 12.7 g/dL — ABNORMAL LOW (ref 13.0–17.0)
MCH: 28 pg (ref 26.0–34.0)
MCHC: 30.8 g/dL (ref 30.0–36.0)
MCV: 90.7 fL (ref 80.0–100.0)
Platelets: 313 10*3/uL (ref 150–400)
RBC: 4.54 MIL/uL (ref 4.22–5.81)
RDW: 14.7 % (ref 11.5–15.5)
WBC: 15.5 10*3/uL — ABNORMAL HIGH (ref 4.0–10.5)
nRBC: 0 % (ref 0.0–0.2)

## 2022-04-07 LAB — LACTATE DEHYDROGENASE: LDH: 363 U/L — ABNORMAL HIGH (ref 98–192)

## 2022-04-07 LAB — BODY FLUID CELL COUNT WITH DIFFERENTIAL
Eos, Fluid: 25 %
Lymphs, Fluid: 29 %
Monocyte-Macrophage-Serous Fluid: 43 % — ABNORMAL LOW (ref 50–90)
Neutrophil Count, Fluid: 3 % (ref 0–25)
Total Nucleated Cell Count, Fluid: 545 cu mm (ref 0–1000)

## 2022-04-07 LAB — PROCALCITONIN: Procalcitonin: 0.57 ng/mL

## 2022-04-07 LAB — PROTEIN, PLEURAL OR PERITONEAL FLUID: Total protein, fluid: 4 g/dL

## 2022-04-07 LAB — LACTATE DEHYDROGENASE, PLEURAL OR PERITONEAL FLUID: LD, Fluid: 403 U/L — ABNORMAL HIGH (ref 3–23)

## 2022-04-07 LAB — BRAIN NATRIURETIC PEPTIDE: B Natriuretic Peptide: 59.9 pg/mL (ref 0.0–100.0)

## 2022-04-07 LAB — TROPONIN I (HIGH SENSITIVITY): Troponin I (High Sensitivity): 13 ng/L (ref <18)

## 2022-04-07 LAB — GRAM STAIN

## 2022-04-07 LAB — TSH: TSH: 0.857 u[IU]/mL (ref 0.350–4.500)

## 2022-04-07 LAB — MAGNESIUM: Magnesium: 2.2 mg/dL (ref 1.7–2.4)

## 2022-04-07 MED ORDER — SODIUM CHLORIDE 0.9% FLUSH
10.0000 mL | INTRAVENOUS | Status: DC | PRN
  Administered 2022-04-08: 10 mL

## 2022-04-07 MED ORDER — PROPRANOLOL HCL ER 60 MG PO CP24
60.0000 mg | ORAL_CAPSULE | Freq: Every day | ORAL | Status: DC
  Filled 2022-04-07: qty 1

## 2022-04-07 MED ORDER — SODIUM CHLORIDE 0.9% FLUSH
10.0000 mL | Freq: Two times a day (BID) | INTRAVENOUS | Status: DC
  Administered 2022-04-08: 10 mL

## 2022-04-07 MED ORDER — PREGABALIN 100 MG PO CAPS
100.0000 mg | ORAL_CAPSULE | Freq: Every day | ORAL | Status: DC
Start: 2022-04-07 — End: 2022-04-07

## 2022-04-07 MED ORDER — CHLORHEXIDINE GLUCONATE CLOTH 2 % EX PADS
6.0000 | MEDICATED_PAD | Freq: Every day | CUTANEOUS | Status: DC
  Administered 2022-04-07 – 2022-04-08 (×2): 6 via TOPICAL

## 2022-04-07 MED ORDER — LATANOPROST 0.005 % OP SOLN
1.0000 [drp] | Freq: Every day | OPHTHALMIC | Status: DC
  Administered 2022-04-07: 1 [drp] via OPHTHALMIC
  Filled 2022-04-07: qty 2.5

## 2022-04-07 MED ORDER — LIDOCAINE HCL (PF) 1 % IJ SOLN
INTRAMUSCULAR | Status: DC | PRN
  Administered 2022-04-07: 10 mL

## 2022-04-07 MED ORDER — ASPIRIN 81 MG PO CHEW
81.0000 mg | CHEWABLE_TABLET | Freq: Every day | ORAL | Status: DC
Start: 2022-04-07 — End: 2022-04-08
  Administered 2022-04-07 – 2022-04-08 (×2): 81 mg via ORAL
  Filled 2022-04-07 (×2): qty 1

## 2022-04-07 MED ORDER — DIPHENOXYLATE-ATROPINE 2.5-0.025 MG PO TABS: 1.0000 | ORAL_TABLET | Freq: Every day | ORAL | Status: DC | PRN

## 2022-04-07 MED ORDER — SODIUM CHLORIDE 0.9 % IV SOLN
3.0000 g | Freq: Four times a day (QID) | INTRAVENOUS | Status: DC
  Administered 2022-04-07 – 2022-04-08 (×5): 3 g via INTRAVENOUS
  Filled 2022-04-07 (×6): qty 8

## 2022-04-07 MED ORDER — LIDOCAINE HCL 1 % IJ SOLN
INTRAMUSCULAR | Status: AC
  Filled 2022-04-07: qty 20

## 2022-04-07 NOTE — Progress Notes (Addendum)
HEMATOLOGY-ONCOLOGY PROGRESS NOTE  ASSESSMENT AND PLAN: This is a 76 year old male with  1.  Metastatic adenocarcinoma of the colon.  The patient is currently on fourth line therapy under the care of Dr. Maryruth Hancock at [facility].  Had poor tolerance of the regorafenib and stopped this medication 1 week early.  He has been off his treatment for about 2 weeks now.  He was due to follow-up with his primary oncologist yesterday but was too weak to go to his appointment.  Discussed the incurable nature of his disease with the patient's daughter and that treatment is palliative.  Patient's daughter states that the majority of the family has not yet ready to consider hospice.  Palliative care team to discuss goals of care with the patient and family.  He will have outpatient follow-up with his medical oncologist at [facility].  2.  Acute metabolic encephalopathy.  Unclear etiology.  CT of the head did not show any evidence of acute intracranial abnormality.  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  The patient seems to have moments of being more alert today.  EEG pending.  Would consider neuro evaluation.  3.  Malignant pleural effusion.  Cytology from thoracentesis performed at [facility] on 4/18 showed malignant cells.  He had over 2 L removed earlier today.  If fluid is rapidly recurring, may need consideration of Pleurx catheter placement.  However, will defer to primary medical oncologist.  4.  Possible aspiration pneumonia.  Antibiotics per hospitalist.  Clenton Pare, DNP, AGPCNP-BC, AOCNP  SUBJECTIVE: Presented with AMS.  According to his daughter, the patient has been having frequent daytime sleepiness and worsening memory loss in the past 2 to 3 weeks.  Due to this, his systemic treatment was placed on hold.  He has been experiencing bilateral arm and hand tremors.  He has been diagnosed with essential tremor started on propanolol.  He has been experiencing a cough.  CTA chest  performed on admission showed a large right pleural effusion with right lower lobe collapse and milder atelectasis in the adjacent right middle lobe and upper lobe.  There is a small pleural effusion with adjacent left basilar atelectasis.  No PE.  He also has multiple bilateral pulmonary nodules and multiple hypodense liver lesions concerning for metastases as well as multiple prominent mediastinal lymph nodes and small volume ascites.  CBC on admission showed a WBC of 15.2, hemoglobin 12.6, calcium 8.8, albumin 2.4, AST 65, alk phos 431, ammonia level was normal at 25.  An ultrasound-guided thoracentesis was performed earlier today and 2 L of fluid was removed.  Followed by Dr. Maryruth Hancock at [facility] for metastatic adenocarcinoma of the transverse colon, KRAS G12A mutated, MSS. He received adjuvant Xeloda-he declined oxaliplatin due to concern for side effects. In January 2021 he presented with small bowel obstruction. 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 He received first-line FOLFOX with partial response to treatment. 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 He progressed on second line FOLFIRI/ bevacizumab, which he tolerated poorly. He then progressed on Lonsurf. He was then switched to regorafenib. Recent CT scan showed disease progression, but he was delayed by almost 1 month in starting regorafenib. Since progression may have occurred prior to start of treatment and CEA was decreasing, the decision was made to continue on current treatment.  The patient required his first thoracentesis at [facility] on 03/17/2022 with 1 L of fluid removed.  Cytology showed malignant cells consistent with adenocarcinoma.  The patient's daughter  states that he started a second cycle of regorafenib and took 2 weeks of this medication.  He developed side effects and stopped early.  He was due to follow-up at [facility] yesterday to discuss treatment options.  However, family could not get him into the  vehicle to bring him to his appointment.  Today, the patient has been having periods where he is more clear.  However, he is still having some confusion.  He is having an EEG performed at the time my visit.  Daughter reports that his appetite has been poor recently.  He has been having some nausea.  No vomiting reported.  Has been evaluated in the past at [facility] neurology due to cognitive deficits.  Family states that they were not definitively told that he had dementia.  Long-term memory seems overall preserved.  Daughter states that they have both home health and palliative services scheduled to follow-up with them in the home.  Oncology History Overview Note  Cancer Staging Cancer of left colon [facility]) Staging form: Colon and Rectum, AJCC 8th Edition - Pathologic stage from 06/09/2017: Stage IIIB (pT3, pN1b, cM0) - Signed by Malachy Mood, MD on 07/06/2017     Cancer of left colon pT3, pN1b, s/p colectomy 2018  06/08/2017 Initial Diagnosis   Cancer of left colon (HCC)    06/08/2017 Imaging   CT AP W Contrast 06/08/17 IMPRESSION: 1. Mass of the transverse colon near splenic flexure measuring up to 4 cm compatible with colonic adenocarcinoma. No lymphadenopathy by imaging criteria. 2. Proximal dilatation of ascending and transverse colon with mild inflammatory changes likely due to obstruction. 3. 4 mm right middle lobe pulmonary nodule, attention on follow-up recommended. 4. 2 mm right kidney nonobstructing nephrolithiasis.    06/09/2017 Surgery   LAPAROSCOPY DIAGNOSTIC, OPEN  LEFT COLECTOMY by Dr. Maisie Fus    06/09/2017 Pathology Results   Diagnosis 06/09/17 Colon, segmental resection for tumor, left - INVASIVE ADENOCARCINOMA, WELL DIFFERENTIATED, SPANNING 5 CM. - TUMOR INVADES THROUGH MUSCULARIS PROPRIA INTO SUBSEROSAL TISSUE. - RESECTION MARGINS ARE NEGATIVE. - METASTATIC CARCINOMA IN TWO OF TWENTY-SEVEN LYMPH NODES (2/27). - SEE ONCOLOGY TABLE.     07/20/2017 Imaging   CT Chest W  Contrast IMPRESSION: No evidence of metastatic disease or other acute findings within the thorax.    08/05/2017 - 12/09/2017 Chemotherapy   Xeloda 2500mg  in am and 2000mg  pm, 2 weeks on and 1 week off, starting on 08/05/17 and plan for 6 months   Reduced to 2000 mg (4 tablets) in AM, 1500 mg (3 tablets) in PM with cycle 3, cycle 3 has been held since 08/1017 due to GI toxicity and active infection.    12/27/2017 Imaging   CT AP W Contrast 12/27/17 IMPRESSION: 1. Interval partial colectomy with transverse colostomy and long Hartmann's pouch. 2. No evidence of recurrent or metastatic disease. 3. Too small to characterize left hepatic lobe lesion, similar to on the prior exam. Ongoing stability favors a benign lesion such as a tiny cyst. This could be re-evaluated at follow-up. 4. Left renal lesion which measures greater than fluid density but is decreased in size compared to the prior. This favors a complex cyst but is technically indeterminate. Recommend attention on follow-up. 5.  Aortic Atherosclerosis (ICD10-I70.0). 6. Right nephrolithiasis.    02/09/2018 Surgery   Surgery 02/09/2018: LAPAROSCOPIC COLOSTOMY REVERSAL ERAS PATHWAY with Romie Levee, MD   07/13/2018 Imaging   IMPRESSION: 1. Interval reversal of the patient's colostomy with reanastomosis. No complicating features. 2. No findings for residual or  recurrent tumor, locoregional lymphadenopathy or metastatic disease elsewhere. 3. Stable 5 mm left hepatic lobe lesion, likely benign cysts. 4. Stable 22 mm lesion projecting off the anterior aspect of the left kidney. This is likely a complex cyst but recommend a precontrast sequence of the abdomen when the patient has is next follow-up CT scan. 5. Stable enlarged prostate gland.    07/17/2019 Imaging   Surveillance CT CAP IMPRESSION: 1. Stable exam. No new or progressive interval findings to suggest recurrent or metastatic disease in the chest, abdomen, or pelvis. 2.  No change in the tiny hypodensity in the dome of the left liver compatible with benign etiology. 3. 2.0 cm exophytic lesion in the left kidney measures stable to minimally smaller in the interval. As noted previously, this is probably a cyst complicated by proteinaceous debris or hemorrhage. Continued attention on follow-up recommended. 4. Prostatomegaly. 5.  Aortic Atherosclerois (ICD10-170.0)   11/18/2019 Imaging   CT AP IMPRESSION: 1. Multiple loops of mildly dilated and fluid-filled small bowel in the left hemidiaphragm with associated mild mesenteric edema, interloop fluid and a small amount of ascites extending upward into the perisplenic/subdiaphragmatic space. No transition point is evident. Differential considerations include infectious enteritis, partial small bowel obstruction, or intermittent small bowel obstruction. 2. Surgical changes of prior sigmoid colectomy. 3. Punctate nonobstructing nephrolithiasis in the interpolar right kidney. 4. Large bilateral renal cysts.   12/21/2019 Imaging   CT AP IMPRESSION: 1. Small-bowel obstruction with transition in the mid abdomen, likely secondary to adhesion. 2. Mildly edematous small bowel wall with stranding of the adjacent mesentery and small amount of free fluid likely reactive to obstruction. Mild enteritis is not excluded. 3. Mildly distended colon proximal to the sigmoid anastomosis. A degree of obstruction or mild narrowing at the level of the anastomosis is not excluded. 4. Additional nonacute findings as above.   12/24/2019 Surgery   POST-OPERATIVE DIAGNOSIS:  small bowel obstruction PROCEDURE:  Procedure(s): LAPAROSCOPY DIAGNOSTIC lysis of adhesions small bowel resection   Surgeon(s): Romie Levee, MD   12/24/2019 Pathology Results   Surgical Pathology Report  Clinical History: small bowel obstruction  FINAL MICROSCOPIC DIAGNOSIS:  A. SMALL BOWEL, PARTIAL COLECTOMY:  -  Adenocarcinoma, moderately  differentiated, 2.5 cm  -  One tumor deposit (0.7 cm)  -  Radial margin involved by carcinoma  -  See comment below   COMMENT:  Given the patient's history of left-sided colon cancer, this likely  represents a metastasis rather than a primary small bowel neoplasm. Dr.  Kenard Gower reviewed the case and agrees with the above diagnosis.  Dr.  Maisie Fus was notified of these results on December 26, 2019. Ancillary  studies are available upon request.    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 SMALL BOWEL, PARTIAL COLECTOMY:  -  Adenocarcinoma, moderately differentiated, 2.5 cm  -  One tumor deposit (0.7 cm)  -  Radial margin involved by carcinoma  -  See comment below   COMMENT:   Given the patient's history of left-sided colon cancer, this likely  represents a metastasis rather than a primary small bowel neoplasm. Dr.  Kenard Gower reviewed the case and agrees with the above diagnosis.  Dr.  Maisie Fus was notified of these results on December 26, 2019. Ancillary  studies are available upon request.   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   I have reviewed the past medical history, past surgical history, social history and family history with the patient and they are unchanged from previous note.   PHYSICAL EXAMINATION: ECOG PERFORMANCE STATUS: 3 - Symptomatic, >50% confined to bed  Vitals:   04/07/22 0800 04/07/22 0920  BP: 136/84 124/86  Pulse: 60 (!) 56  Resp: 13 15  Temp:    SpO2: 96% 96%   There were no vitals filed for this visit.  Intake/Output from  previous day: No intake/output data recorded.  Physical Exam Vitals reviewed.  Constitutional:      Comments: Sleepy, can be woken up but has some confusion  HENT:     Head: Normocephalic.  Cardiovascular:     Rate and Rhythm: Normal rate.  Pulmonary:     Effort: Pulmonary effort is normal. No respiratory distress.  Abdominal:     Palpations: Abdomen is soft.  Neurological:     Comments: Oriented to person but not place or time    LABORATORY DATA:  I have reviewed the data as listed    Latest Ref Rng & Units 04/07/2022    3:01 AM 04/06/2022    9:36 PM 04/06/2022    2:16 PM  CMP  Glucose 70 - 99 mg/dL 324    401    BUN 8 - 23 mg/dL 12    16    Creatinine 0.61 - 1.24 mg/dL 0.27    2.53    Sodium 135 - 145 mmol/L 136   134   137    Potassium 3.5 - 5.1 mmol/L 4.3   4.3   4.9    Chloride 98 - 111 mmol/L 99    103    CO2 22 - 32 mmol/L 28    25    Calcium 8.9 - 10.3 mg/dL 9.1    8.8    Total Protein 6.5 - 8.1 g/dL 6.0    5.9    Total Bilirubin 0.3 - 1.2 mg/dL 0.8    0.7    Alkaline Phos 38 - 126 U/L 447    431    AST 15 - 41 U/L 52    65    ALT 0 - 44 U/L 34    31      Lab Results  Component Value Date   WBC 15.5 (H) 04/07/2022   HGB 12.7 (L) 04/07/2022   HCT 41.2 04/07/2022   MCV 90.7 04/07/2022   PLT 313 04/07/2022   NEUTROABS 7.5 01/10/2020    Lab Results  Component Value Date   CEA1 1.86 12/27/2017    DG Chest 1 View  Result Date: 04/07/2022 CLINICAL DATA:  Provided history: Post right-sided thoracentesis, 2 liters removed. EXAM: CHEST  1 VIEW COMPARISON:  CT angiogram chest 04/06/2022. Chest radiographs 04/06/2022 and earlier. FINDINGS: Right chest infusion port catheter with tip projecting at the level of the superior cavoatrial junction. Prior median sternotomy. As before, there is a fracture within the inferior most sternotomy wire. Cardiomegaly. Aortic atherosclerosis. Small residual right pleural effusion status post thoracentesis. No evidence of pneumothorax.  Persistent right lower and middle lobe atelectasis. Redemonstrated small left pleural effusion. Increasing opacity within the left mid and lower lung field, which may reflect atelectasis and/or  consolidation. Numerous small bilateral pulmonary nodules, better appreciated on the recent prior chest CT of 04/06/2022. No evidence of acute bony abnormality. IMPRESSION: Small residual right pleural effusion status post thoracentesis. No evidence of pneumothorax. Persistent right lower and middle lobe atelectasis. Redemonstrated small left pleural effusion. Increasing opacity within the left mid and lower lung field, which may reflect atelectasis and/or consolidation. Numerous small bilateral pulmonary nodules, better appreciated on the recent prior chest CT of 04/06/2022. Cardiomegaly. Electronically Signed   By: Jackey Loge D.O.   On: 04/07/2022 09:33   DG Chest 2 View  Result Date: 04/06/2022 CLINICAL DATA:  Shortness of breath. EXAM: CHEST - 2 VIEW COMPARISON:  Chest radiograph December 16, 2005 and chest CT July 20, 2017 FINDINGS: Right chest wall Port-A-Cath with tip overlying right atrium. The heart size and mediastinal contours are partially obscured but the cardiac silhouette appears slightly enlarged. Moderate right-sided pleural effusion with adjacent airspace consolidation. Low left lung volume with streaky opacities in the left lung base. Prior median sternotomy with fracture of the inferior most sternotomy wire unchanged from prior. IMPRESSION: 1. Moderate right-sided pleural effusion with adjacent airspace consolidation which may reflect atelectasis or infiltrate. 2. Streaky opacities in the left lung base likely atelectasis. Electronically Signed   By: Maudry Mayhew M.D.   On: 04/06/2022 13:52   CT Head Wo Contrast  Result Date: 04/06/2022 CLINICAL DATA:  Mental status change, unknown cause EXAM: CT HEAD WITHOUT CONTRAST TECHNIQUE: Contiguous axial images were obtained from the base of the skull  through the vertex without intravenous contrast. RADIATION DOSE REDUCTION: This exam was performed according to the departmental dose-optimization program which includes automated exposure control, adjustment of the mA and/or kV according to patient size and/or use of iterative reconstruction technique. COMPARISON:  MRI head August 30, 2020. FINDINGS: Brain: No evidence of acute infarction, hemorrhage, hydrocephalus, extra-axial collection or mass lesion/mass effect. Moderate to advanced patchy white matter hypodensities, nonspecific but compatible with chronic microvascular ischemic disease. Cerebral atrophy. Areas of dural calcification. Vascular: Intracranial atherosclerosis. No hyperdense vessel identified. Skull: No acute fracture. Sinuses/Orbits: Clear sinuses.  No acute orbital findings. Other: No mastoid effusions. IMPRESSION: 1. No evidence of acute intracranial abnormality. 2. Chronic microvascular ischemic disease and cerebral atrophy (ICD10-G31.9). Electronically Signed   By: Feliberto Harts M.D.   On: 04/06/2022 14:28   CT Angio Chest PE W and/or Wo Contrast  Result Date: 04/06/2022 CLINICAL DATA:  Pulmonary embolism (PE) suspected, high prob EXAM: CT ANGIOGRAPHY CHEST WITH CONTRAST TECHNIQUE: Multidetector CT imaging of the chest was performed using the standard protocol during bolus administration of intravenous contrast. Multiplanar CT image reconstructions and MIPs were obtained to evaluate the vascular anatomy. RADIATION DOSE REDUCTION: This exam was performed according to the departmental dose-optimization program which includes automated exposure control, adjustment of the mA and/or kV according to patient size and/or use of iterative reconstruction technique. CONTRAST:  OMNIPAQUE IOHEXOL 350 MG/ML SOLN COMPARISON:  CT 12/21/2019, CT 07/17/2019 FINDINGS: Cardiovascular: After repeat scan due to additional poor contrast bolus, there is satisfactory opacification of the pulmonary  arteries to the segmental level. No evidence of pulmonary embolism. Normal cardiac size.No pericardial disease. The thoracic aorta is unremarkable. There is a chest port with catheter tip in the right atrium. Mediastinum/Nodes: There are multiple prominent mediastinal lymph nodes, none of which are pathologically enlarged, largest being a left anterior mediastinal node measuring 0.9 cm (series 11, image 153). These may be reactive. Lungs/Pleura: There is a large right and small  left pleural effusion. There is collapse of the right lower lobe and atelectasis in the adjacent right upper and middle lobe. There is atelectasis in the left lower lobe. There is ground-glass in the right apex likely reflecting volume loss as well. There are multiple bilateral pulmonary nodules concerning for metastases, for reference: - Anterior right upper lobe nodule measures 1.3 cm (series 15, image 57). - Left upper lobe nodule measures 1.0 cm (series 15, image 36). Upper Abdomen: Heterogeneous appearance of liver due to the presence of multiple hypodense liver lesions, concerning for metastases. Small volume abdominal ascites. There are multiple renal cysts. Musculoskeletal: No acute osseous abnormality. No suspicious lytic or blastic lesions. Prior median sternotomy. Review of the MIP images confirms the above findings. IMPRESSION: Large right pleural effusion with right lower lobe collapse and milder atelectasis in the adjacent right middle lobe right upper lobe. Small left pleural effusion with adjacent left basilar atelectasis. No evidence of pulmonary embolism. Multiple bilateral pulmonary nodules and multiple hypodense liver lesions concerning for metastases. Multiple prominent mediastinal lymph nodes. Small volume abdominal ascites. Recommend correlation with any more recent imaging, if available. Electronically Signed   By: Caprice Renshaw M.D.   On: 04/06/2022 17:01     Future Appointments  Date Time Provider Department Center   04/07/2022  1:00 PM [facility] 7 [facility]      LOS: 1 day

## 2022-04-07 NOTE — ED Notes (Signed)
Pt moved from recliner to bed and transported to IR. Daughter to accompany to sign consent.  ?

## 2022-04-07 NOTE — ED Notes (Signed)
Pt back from IR, 2L drained. Pt placed in hospital bed.  ?

## 2022-04-07 NOTE — ED Notes (Signed)
EEG at bedside.

## 2022-04-07 NOTE — Progress Notes (Signed)
Patient's chart was reviewed.  Please see note by Altamese Dilling, DNP.  76 year old man with advanced colon cancer and has progressed on multiple therapies and currently off regorafenib after he has failure to thrive and possible disease progression.  Patient hospitalized for excessive fatigue and oral effusion. ? ?Overall, I agree with the current management approach and I am in favor of treating any reversible conditions at this time.  It appears that his decline is likely related to cancer progression mostly. ? ?I recommendation is to continue with supportive care and he can discuss further treatment for his cancer with his oncologist upon discharge.  I have no objections to transitioning to hospice if the patient desires to. ?

## 2022-04-07 NOTE — ED Notes (Signed)
Breakfast order placed ?

## 2022-04-07 NOTE — ED Notes (Signed)
Offered pt's family member recliner for comfort, family declined. Daughter is at bedside, pt denies any needs at this time, resting w/ no acute distress noted. Monitoring devices in place, call light within reach, bed locked, lights dimmed to pt preference. ?

## 2022-04-07 NOTE — Progress Notes (Signed)
Pt. Arrived to 5W-33 from ED, pt alert and oriented to self, and able to tell RN he was in the hospital, unsure of the year but able to recall president. Skin clean dry and intact small bruise noted on sacrum. Pt. Placed on telemetry and pt. And family oriented to room and unit. BP 125/80 (BP Location: Right Arm)   Pulse 71   Temp 98 ?F (36.7 ?C) (Axillary)   Resp 17   Ht '6\' 1"'$  (1.854 m)   Wt 102.1 kg   SpO2 90%   BMI 29.69 kg/m?  ? ?

## 2022-04-07 NOTE — Progress Notes (Signed)
EEG complete - results pending 

## 2022-04-07 NOTE — Procedures (Signed)
Patient Name: James Doyle  ?MRN: [id number]  ?Epilepsy Attending: Lora Havens  ?Referring Physician/Provider: Thurnell Lose, MD ?Date: 04/07/2022 ?Duration: 22.28 mins ? ?Patient history: 76 year old male with altered mental status.  EEG to evaluate for seizure. ? ?Level of alertness: Awake, asleep ? ?AEDs during EEG study: None ? ?Technical aspects: This EEG study was done with scalp electrodes positioned according to the 10-20 International system of electrode placement. Electrical activity was acquired at a sampling rate of '500Hz'$  and reviewed with a high frequency filter of '70Hz'$  and a low frequency filter of '1Hz'$ . EEG data were recorded continuously and digitally stored.  ? ?Description: The posterior dominant rhythm consists of 8 Hz activity of moderate voltage (25-35 uV) seen predominantly in posterior head regions, symmetric and reactive to eye opening and eye closing. Sleep was characterized by vertex waves, sleep spindles (12 to 14 Hz), maximal frontocentral region. EEG showed intermittent generalized 3 to 6 Hz theta-delta slowing. Hyperventilation and photic stimulation were not performed.    ? ?ABNORMALITY ?- Intermittent slow, generalized ? ?IMPRESSION: ?This study is suggestive of mild diffuse encephalopathy, nonspecific etiology. No seizures or epileptiform discharges were seen throughout the recording. ? ?Lora Havens  ? ?

## 2022-04-07 NOTE — Progress Notes (Signed)
Pharmacy Antibiotic Note ? ?James Doyle is a 76 y.o. male admitted on 04/06/2022 with aspiration pneumonia.  Pharmacy has been consulted for ampicillin-sulbactam (Unasyn) dosing. ? ?Plan: ?Initiate ampicillin-sulbactam 3g IV q6h  ?Monitor SCr, WBC, temp and s/sx of infection daily ?F/u cultures and clinical progress and de-escalate therapy as able ? ?  ? ?Temp (24hrs), Avg:97.9 ?F (36.6 ?C), Min:97.9 ?F (36.6 ?C), Max:97.9 ?F (36.6 ?C) ? ?Recent Labs  ?Lab 04/06/22 ?1416 04/07/22 ?0301  ?WBC 15.2* 15.5*  ?CREATININE 1.06 1.05  ?LATICACIDVEN 1.9  --   ?  ?CrCl cannot be calculated (Unknown ideal weight.).   ? ?Allergies  ?Allergen Reactions  ? Gabapentin Other (See Comments)  ?  Dizzy  ? ? ?Antimicrobials this admission: ?Ceftriaxone 5/8 ?Ampicillin-sulbactam 5/9 >>  ? ?Dose adjustments this admission: ?N/A ? ?Microbiology results: ?5/9 Pleural fluid Cx: to be collected  ? ?Thank you for allowing pharmacy to be a part of this patient?s care. ? ?Kaleen Mask ?04/07/2022 10:46 AM ? ?

## 2022-04-07 NOTE — Evaluation (Signed)
Physical Therapy Evaluation ?Patient Details ?Name: James Doyle ?MRN: [id number] ?DOB: 18-Apr-1946 ?Today's Date: 04/07/2022 ? ?History of Present Illness ? Pt is a 76 y/o male admitted secondary to AMS. Thought to be metabolic encephalopathy. Pt also with R pleural effusion and s/p thoracentesis on 5/9. PMH includes colon CA, memory deficits, and R TKA.  ?Clinical Impression ? Pt admitted secondary to problem above with deficits below. Pt with cognitive deficits, however, family reports pt getting closer to baseline. Requiring assist from family for bed mobility tasks and min A for transfers and gait. Pt family very supportive and can provide assist at home. Recommending HHPT at d/c to address current deficits. Will continue to follow acutely.    ?   ? ?Recommendations for follow up therapy are one component of a multi-disciplinary discharge planning process, led by the attending physician.  Recommendations may be updated based on patient status, additional functional criteria and insurance authorization. ? ?Follow Up Recommendations Home health PT ? ?  ?Assistance Recommended at Discharge Frequent or constant Supervision/Assistance  ?Patient can return home with the following ? A little help with walking and/or transfers;A little help with bathing/dressing/bathroom;Assistance with cooking/housework;Help with stairs or ramp for entrance;Assist for transportation;Direct supervision/assist for financial management;Direct supervision/assist for medications management ? ?  ?Equipment Recommendations None recommended by PT  ?Recommendations for Other Services ?    ?  ?Functional Status Assessment Patient has had a recent decline in their functional status and demonstrates the ability to make significant improvements in function in a reasonable and predictable amount of time.  ? ?  ?Precautions / Restrictions Precautions ?Precautions: Fall ?Restrictions ?Weight Bearing Restrictions: No  ? ?  ? ?Mobility ? Bed  Mobility ?Overal bed mobility: Needs Assistance ?  ?  ?  ?  ?  ?  ?General bed mobility comments: Family wanting to demonstrate technique they use for home. Pt requiring assist for LE and trunk throughout. Anticipate close to mod A ?  ? ?Transfers ?Overall transfer level: Needs assistance ?Equipment used: Rolling walker (2 wheels) ?Transfers: Sit to/from Stand ?Sit to Stand: Min assist, From elevated surface ?  ?  ?  ?  ?  ?General transfer comment: Min A for lift assist and steadying ?  ? ?Ambulation/Gait ?Ambulation/Gait assistance: Min assist ?Gait Distance (Feet): 75 Feet ?Assistive device: Rolling walker (2 wheels) ?Gait Pattern/deviations: Step-through pattern, Decreased stride length, Trunk flexed ?Gait velocity: Decreased ?  ?  ?General Gait Details: Cues for proximity to device and upright posture throughout. Required cues to stay inside of walker. Min A for steadying. ? ?Stairs ?  ?  ?  ?  ?  ? ?Wheelchair Mobility ?  ? ?Modified Rankin (Stroke Patients Only) ?  ? ?  ? ?Balance Overall balance assessment: Needs assistance ?Sitting-balance support: Feet supported, No upper extremity supported ?Sitting balance-Leahy Scale: Fair ?  ?  ?Standing balance support: Bilateral upper extremity supported ?Standing balance-Leahy Scale: Poor ?Standing balance comment: REliant on BUE and external support ?  ?  ?  ?  ?  ?  ?  ?  ?  ?  ?  ?   ? ? ? ?Pertinent Vitals/Pain Pain Assessment ?Pain Assessment: Faces ?Faces Pain Scale: Hurts a little bit ?Pain Location: abdomen ?Pain Descriptors / Indicators: Grimacing, Guarding ?Pain Intervention(s): Monitored during session, Limited activity within patient's tolerance, Repositioned  ? ? ?Home Living Family/patient expects to be discharged to:: Private residence ?Living Arrangements: Spouse/significant other ?Available Help at Discharge: Family;Available 24 hours/day ?Type of  Home: House ?Home Access: Stairs to enter ?Entrance Stairs-Rails: Right;Left;Can reach both ?Entrance  Stairs-Number of Steps: 4 ?  ?Home Layout: One level ?Home Equipment: Shower seat - built in;Toilet riser;Rolling Walker (2 wheels);Cane - single point;Rollator (4 wheels);BSC/3in1 ?   ?  ?Prior Function Prior Level of Function : Needs assist ?  ?  ?  ?  ?  ?  ?Mobility Comments: Uses rollator outside of house. RW inside of home. Sometimes requires physical assist for bed mobility and other mobility tasks. ?ADLs Comments: Assists with bathing/dressing ?  ? ? ?Hand Dominance  ?   ? ?  ?Extremity/Trunk Assessment  ? Upper Extremity Assessment ?Upper Extremity Assessment: Defer to OT evaluation ?  ? ?Lower Extremity Assessment ?Lower Extremity Assessment: Generalized weakness ?  ? ?Cervical / Trunk Assessment ?Cervical / Trunk Assessment: Kyphotic  ?Communication  ? Communication: HOH  ?Cognition Arousal/Alertness: Awake/alert ?Behavior During Therapy: [facility] for tasks assessed/performed ?Overall Cognitive Status: History of cognitive impairments - at baseline ?  ?  ?  ?  ?  ?  ?  ?  ?  ?  ?  ?  ?  ?  ?  ?  ?General Comments: Memory deficits at baseline. ?  ?  ? ?  ?General Comments General comments (skin integrity, edema, etc.): Pt's wife, daughter, and granddaughter present ? ?  ?Exercises    ? ?Assessment/Plan  ?  ?PT Assessment Patient needs continued PT services  ?PT Problem List Decreased strength;Decreased activity tolerance;Decreased mobility;Decreased balance;Decreased knowledge of use of DME;Decreased knowledge of precautions;Decreased safety awareness;Decreased cognition ? ?   ?  ?PT Treatment Interventions DME instruction;Gait training;Functional mobility training;Therapeutic activities;Therapeutic exercise;Balance training;Patient/family education   ? ?PT Goals (Current goals can be found in the Care Plan section)  ?Acute Rehab PT Goals ?Patient Stated Goal: to go home ?PT Goal Formulation: With patient ?Time For Goal Achievement: 04/21/22 ?Potential to Achieve Goals: Good ? ?  ?Frequency Min 3X/week ?   ? ? ?Co-evaluation   ?  ?  ?  ?  ? ? ?  ?AM-PAC PT "6 Clicks" Mobility  ?Outcome Measure Help needed turning from your back to your side while in a flat bed without using bedrails?: A Little ?Help needed moving from lying on your back to sitting on the side of a flat bed without using bedrails?: A Lot ?Help needed moving to and from a bed to a chair (including a wheelchair)?: A Little ?Help needed standing up from a chair using your arms (e.g., wheelchair or bedside chair)?: A Little ?Help needed to walk in hospital room?: A Little ?Help needed climbing 3-5 steps with a railing? : A Lot ?6 Click Score: 16 ? ?  ?End of Session Equipment Utilized During Treatment: Gait belt ?Activity Tolerance: Patient tolerated treatment well ?Patient left: in bed;with call bell/phone within reach;with family/visitor present;with nursing/sitter in room (on bed in ED) ?Nurse Communication: Mobility status ?PT Visit Diagnosis: Unsteadiness on feet (R26.81);Muscle weakness (generalized) (M62.81);Difficulty in walking, not elsewhere classified (R26.2) ?  ? ?Time: 7169-6789 ?PT Time Calculation (min) (ACUTE ONLY): 31 min ? ? ?Charges:   PT Evaluation ?$PT Eval Moderate Complexity: 1 Mod ?PT Treatments ?$Gait Training: 8-22 mins ?  ?   ? ? ?Reuel Derby, PT, DPT  ?Acute Rehabilitation Services  ?Pager: [phone] ?Office: [phone] ? ? ?[place] ?04/07/2022, 2:17 PM ?

## 2022-04-07 NOTE — Progress Notes (Signed)
Attempted echocardiogram three times; having EEG, being transferred up to room, locating patient/patient getting situated in room. Will attempt again 04/08/22 as schedule permits. ? ?04/07/2022 3:33 PM  ?Joette Catching RCS ?

## 2022-04-07 NOTE — ED Notes (Signed)
Pt reports he takes lyrica and Cymbalta at night. Medication times adjusted. ?

## 2022-04-07 NOTE — Consult Note (Signed)
? ?                                                                                ?Consultation Note ?Date: 04/07/2022  ? ?Patient Name: James Doyle  ?DOB: 1946-08-23  MRN: [id number]  Age / Sex: 76 y.o., male  ?PCP: Clelia Croft, MD ?Referring Physician: Thurnell Lose, MD ? ?Reason for Consultation: Establishing goals of care ? ?HPI/Patient Profile: 76 y.o. 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 ? ?Patient admitted for workup of acute metabolic encephalopathy. PMT has been consulted to assist with goals of care conversation.  ? ?Clinical Assessment and Goals of Care: ? ?I have reviewed medical records including EPIC notes, labs and imaging, received report from RN, assessed the patient and then met at the bedside along with wife Margaretha Sheffield, daughter Lynelle Smoke, and granddaughter to discuss diagnosis prognosis, GOC, EOL wishes, disposition and options. ? ?I introduced Palliative Medicine as specialized medical care for people living with serious illness. It focuses on providing relief from the symptoms and stress of a serious illness. The goal is to improve quality of life for both the patient and the family. ? ?Medical History Review and Understanding: ?Patient's daughter Lynelle Smoke shares that he was diagnosed with lung cancer in 2019 and is now on fourth line treatment for palliation, followed at [facility]. We discussed his peripheral neuropathy, ongoing confusion, issues with bowels. ? ?Social History: ?Patient lives at home with his wife and daughter lives right nearby for additional support. He has several grandchildren that bring him joy and family is very important to him. They are [place]. ? ?Functional and Nutritional State: ?Patient has rapidly declined over the past two weeks. He was ambulating with a walker and also a  wheelchair, needing help for all ADLs. Weakness and appetite has been decreased for some time before this faster decline as well. Has been less able to assist family and work with them to get dressed, bathed etc. ? ?Palliative Symptoms: ?Pain, neuropathy, constipation, diarrhea, depression ? ?Code Status: ?Concepts specific to code status, artifical feeding and hydration, and rehospitalization were considered and discussed. ? ?Discussion: ?Patient's daughter initially meets with me outside of his room and we discussed that he has just recently been seen by [facility] and [facility] for the first time. This is all new to her and it is unclear when she should be asking for assistance from each agency. We reviewed the differences and she greatly appreciates this distinction. She shares that family is not ready to consider hospice at this time. Patient's goal is to live long enough to develop a relationship with his great-grandchild, due in 6 months. He finds great joy in spending time with his grandchildren as well. Patient has been seen by oncology at [facility] during his granddaughter's residency there, as well as at [facility]. This granddaughter was able to be present during initial palliative visit before moving to [facility]. We joined patient's wife and other granddaughter at the bedside to continue the conversation. Family  has great resources and equipment available to continue caring for him at home while noting it is getting increasingly difficult. Patient would not want to go to SNF for care. We reviewed the availability of assistance with caregivers through hospice versus private pay and family wishes to continue efforts to care for him themselves as long as they can. Patient is not able to participate in the discussion, however his wife shares that he knows he is not getting better. There is mention of a possible new treatment that would be available around Christmas time.  Counseled on  the importance of anticipatory care planning and ongoing discussions. ? ? ?The difference between aggressive medical intervention and comfort care was considered in light of the patient's goals of care. Hospice and Palliative Care services outpatient were explained and offered.  ? ?Discussed the importance of continued conversation with family and the medical providers regarding overall plan of care and treatment options, ensuring decisions are within the context of the patient?s values and GOCs.  ? ?Questions and concerns were addressed.  Hard Choices booklet left for review. The family was encouraged to call with questions or concerns.  PMT will continue to support holistically.  ? ?  ? ?SUMMARY OF RECOMMENDATIONS   ?Continue full scope treatment, patient and family are not ready for hospice ?Patient's goal is to continue spending time with grandchildren and hopeful great-grandchild later this year ?Psychosocial and emotional support provided ?PMT will continue to follow ? ?Prognosis:  ?Poor prognosis given metastatic colon cancer on fourth line palliative treatment with functional/nutritional/cognitive decline  ? ?Discharge Planning: Home with Palliative Services  ? ?  ? ?Primary Diagnoses: ?Present on Admission: ? Cancer of left colon pT3, pN1b, s/p colectomy 2018 ? AMS (altered mental status) ? ? ?I have reviewed the medical record, interviewed the patient and family, and examined the patient. The following aspects are pertinent. ? ?Past Medical History:  ?Diagnosis Date  ? Adenomatous polyps 03/17/2007  ? Aortic atherosclerosis ([place]) 12/27/2017  ? Noted on CT abd/pelvis  ? Arthritis   ? Cancer of left colon ([place]) 06/08/2017  ? Cellulitis of left hand   ? Chronic back pain   ? Chronic pain of right knee   ? Chronic right hip pain   ? Colon obstruction s/p colectomy/colostomy 06/09/2017 06/12/2017  ? DDD (degenerative disc disease), lumbar   ? H/O vocal cord paralysis   ? left   ? History of hypokalemia   ?  History of thumb fracture   ? Left  ? Left nephrolithiasis 12/27/2017  ? Noted on CT abd/pelvis  ? ?Social History  ? ?Socioeconomic History  ? Marital status: Married  ?  Spouse name: Not on file  ? Number of children: 1  ? Years of education: Not on file  ? Highest education level: Not on file  ?Occupational History  ? Occupation: retired Armed forces training and education officer  ?Tobacco Use  ? Smoking status: Never  ? Smokeless tobacco: Never  ?Vaping Use  ? Vaping Use: Never used  ?Substance and Sexual Activity  ? Alcohol use: No  ? Drug use: No  ? Sexual activity: Not Currently  ?Other Topics Concern  ? Not on file  ?Social History Narrative  ? Patient is married, 1 daughter Lynelle Smoke  ? 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 No/never tobacco, no alcohol no drug use  ? ?Social Determinants of Health  ? ?  Financial Resource Strain: Not on file  ?Food Insecurity: Not on file  ?Transportation Needs: Not on file  ?Physical Activity: Not on file  ?Stress: Not on file  ?Social Connections: Not on file  ? ?Family History  ?Problem Relation Age of Onset  ? Heart failure Mother   ? Stroke Father   ? Diabetes Brother   ? Congestive Heart Failure Brother   ? Cancer Maternal Uncle   ?     colon cancer  ? Colon cancer Neg Hx   ? Liver disease Neg Hx   ? Pancreatic cancer Neg Hx   ? Esophageal cancer Neg Hx   ? ?Scheduled Meds: ? aspirin  81 mg Oral Daily  ? dorzolamide-timolol  1 drop Both Eyes BID  ? enoxaparin (LOVENOX) injection  40 mg Subcutaneous Q24H  ? latanoprost  1 drop Both Eyes QHS  ? lidocaine      ? ?Continuous Infusions: ? ampicillin-sulbactam (UNASYN) IV 3 g (04/07/22 1246)  ? ?PRN Meds:.acetaminophen, diphenoxylate-atropine, lidocaine (PF), ondansetron, polyethylene glycol ?Medications Prior to Admission:  ?Prior to Admission medications   ?Medication Sig Start Date End Date Taking? Authorizing Provider  ?acetaminophen (TYLENOL)  500 MG tablet Take 500 mg by mouth 2 (two) times daily.   Yes [provider]  ?diphenoxylate-atropine (LOMOTIL) 2.5-0.025 MG tablet Take 1 tablet by mouth daily as needed for diarrhea or loose stools. 4/

## 2022-04-07 NOTE — Progress Notes (Addendum)
?                                  PROGRESS NOTE                                             ?                                                                                                                     ?                                         ? ? Patient Demographics:  ? ? James Doyle, is a 76 y.o. male, DOB - 21-Nov-1946, VOZ:[id number] ? ?Outpatient Primary MD for the patient is Clelia Croft, MD    LOS - 1  Admit date - 04/06/2022   ? ?Chief Complaint  ?Patient presents with  ? Loss of Consciousness  ?    ? ?Brief Narrative (HPI from H&P)   76 y.o. 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  Cording to family at [facility] patient was recently restarted on a new immunotherapy protocol for his cancer about 6 weeks ago and has been increasingly more tired and fatigued since then.  Yesterday after breakfast patient became increasingly more sleepy and appeared to be sweaty and clammy, there was no real loss of consciousness, no seizure-like activity or incontinence.  He was subsequently brought to the ER where he was found to have a large right-sided pleural effusion along with metabolic encephalopathy and admitted to the hospital.  ? ? Subjective:  ? ? James Doyle today has, No headache, No chest pain, No abdominal pain - No Nausea, No new weakness tingling or numbness, no SOB. ? ? Assessment  & Plan :  ? ?Acute metabolic encephalopathy on top of advanced dementia in a patient with stage IV malignant colon cancer - his main symptom seems to be increased fatigue since the new immunotherapy protocol - Regorafenib - was started for his cancer 6 weeks ago at [facility], he also takes Lyrica and Cymbalta and appears to be slightly dehydrated.  His head CT is unremarkable, he  has no focal deficits, check EEG. with supportive care he is already showing signs of improvement.  Will hold all offending medications, hydrate with IV fluids.  PT OT and speech eval.  Soft diet for now and monitor. ? ?Stage IV metastatic colon cancer with mets to liver and right lung.  With most likely worsening malignant right-sided pleural effusion, IR to tap.  Currently  minimal to no hypoxia.  Supplemental oxygen as needed for now.  CTA rules out PE. ?  ?Mild resting sinus bradycardia.  With stable blood pressure, hold beta-blocker, check TSH.  Monitor. ? ?Possible aspiration pneumonia with mild leukocytosis.  High risk due to #1 above.  Soft diet, speech evaluation, since he has mild leukocytosis will cover with Unasyn and monitor.  Added flutter valve and I-S for pulmonary toiletry as well. ? ?Essential tremors.  For now hold beta-blocker due to resting bradycardia. ? ?Left bundle branch block noted on EKG.  EKG from 2 years ago did not have it, no chest pain however will check troponin baseline, not a candidate for invasive procedures due to #1 above, placed on aspirin, beta-blocker has to be held due to resting bradycardia, check baseline lipid panel as well, echocardiogram to evaluate wall motion and EF. ? ?   ? ?Condition - Extremely Guarded ? ?Family Communication  : Daughter Lynelle Smoke [phone] bedside on 04/07/2022 ? ?Code Status :  Full ? ?Consults  :  [place] ? ?PUD Prophylaxis :  ? ? Procedures  :    ? ?EEG ? ?R. Thoracentesis due 04/07/22 -  ? ?CT Head - Non acute ? ?CT Chest -  Large right pleural effusion with right lower lobe collapse and milder atelectasis in the adjacent right middle lobe right upper lobe. Small left pleural effusion with adjacent left basilar atelectasis. No evidence of pulmonary embolism. Multiple bilateral pulmonary nodules and multiple hypodense liver lesions concerning for metastases. Multiple prominent mediastinal lymph nodes. Small volume abdominal ascites. Recommend  correlation with any more recent imaging, if available.   ? ?   ? ?Disposition Plan  :   ? ?Status is: Inpatient ? ? ?DVT Prophylaxis  :   ? ?enoxaparin (LOVENOX) injection 40 mg Start: 04/06/22 2200 ? ?Lab Results  ?Component Value Date  ? PLT 313 04/07/2022  ? ? ?Diet :  ?Diet Order   ? ?       ?  DIET SOFT Room service appropriate? Yes; Fluid consistency: Thin  Diet effective now       ?  ? ?  ?  ? ?  ?  ? ?Inpatient Medications ? ?Scheduled Meds: ? dorzolamide-timolol  1 drop Both Eyes BID  ? enoxaparin (LOVENOX) injection  40 mg Subcutaneous Q24H  ? latanoprost  1 drop Both Eyes QHS  ? lidocaine      ? propranolol ER  60 mg Oral Daily  ? ?Continuous Infusions: ? cefTRIAXone (ROCEPHIN)  IV Stopped (04/06/22 1603)  ? ?PRN Meds:.acetaminophen, diphenoxylate-atropine, lidocaine (PF), ondansetron, polyethylene glycol, prochlorperazine ? ?Antibiotics  :   ? ?Anti-infectives (From admission, onward)  ? ? Start     Dose/Rate Route Frequency Ordered Stop  ? 04/06/22 1530  cefTRIAXone (ROCEPHIN) 1 g in sodium chloride 0.9 % 100 mL IVPB       ? 1 g ?200 mL/hr over 30 Minutes Intravenous Every 24 hours 04/06/22 1515    ? ?  ? ? ? Time Spent in minutes  30 ? ? ?Lala Lund M.D on 04/07/2022 at 9:55 AM ? ?To page go to [email or web address]  ? ?[facility] -  Office  [phone] ? ?See all Orders from today for further details ? ? ? Objective:  ? ?Vitals:  ? 04/07/22 0600 04/07/22 0723 04/07/22 0800 04/07/22 0920  ?BP: 126/71 135/81 136/84 124/86  ?Pulse: 69 (!) 59 60 (!) 56  ?Resp: '18 13 13 15  '$ ?Temp:      ?  SpO2: 97% 95% 96% 96%  ? ? ?Wt Readings from Last 3 Encounters:  ?08/05/21 104.3 kg  ?01/10/20 101.3 kg  ?12/22/19 104 kg  ? ? ?No intake or output data in the 24 hours ending 04/07/22 0955 ? ? ?Physical Exam ? ?Awake Alert, No new F.N deficits, Normal affect ?[place].AT,PERRAL ?Supple Neck, No JVD,   ?Symmetrical Chest wall movement, Good air movement bilaterally, CTAB ?RRR,No Gallops,Rubs or new Murmurs,  ?+ve B.Sounds,  Abd Soft, No tenderness,   ?No Cyanosis, Clubbing or edema  ?  ? ? Data Review:  ? ? ?CBC ?Recent Labs  ?Lab 04/06/22 ?1416 04/06/22 ?2136 04/07/22 ?0301  ?WBC 15.2*  --  15.5*  ?HGB 12.6* 13.6 12.7*  ?HCT 40.2 40.0 41.2  ?PLT 322  --  313  ?MCV 89.5  --  90.7  ?MCH 28.1  --  28.0  ?MCHC 31.3  --  30.8  ?RDW 14.7  --  14.7  ? ? ?Electrolytes ?Recent Labs  ?Lab 04/06/22 ?1416 04/06/22 ?2115 04/06/22 ?2136 04/07/22 ?0301  ?NA 137  --  134* 136  ?K 4.9  --  4.3 4.3  ?CL 103  --   --  99  ?CO2 25  --   --  28  ?GLUCOSE 126*  --   --  116*  ?BUN 16  --   --  12  ?CREATININE 1.06  --   --  1.05  ?CALCIUM 8.8*  --   --  9.1  ?AST 65*  --   --  52*  ?ALT 31  --   --  34  ?ALKPHOS 431*  --   --  447*  ?BILITOT 0.7  --   --  0.8  ?ALBUMIN 2.4*  --   --  2.3*  ?MG  --   --   --  2.2  ?PROCALCITON  --  0.58  --  0.57  ?LATICACIDVEN 1.9  --   --   --   ?INR  --  1.1  --   --   ?TSH  --   --   --  0.857  ?AMMONIA  --  25  --   --   ?BNP  --   --   --  59.9  ? ? ?Micro Results ?No results found for this or any previous visit (from the past 240 hour(s)). ? ?Radiology Reports ?DG Chest 2 View ? ?Result Date: 04/06/2022 ?CLINICAL DATA:  Shortness of breath. EXAM: CHEST - 2 VIEW COMPARISON:  Chest radiograph December 16, 2005 and chest CT July 20, 2017 FINDINGS: Right chest wall Port-A-Cath with tip overlying right atrium. The heart size and mediastinal contours are partially obscured but the cardiac silhouette appears slightly enlarged. Moderate right-sided pleural effusion with adjacent airspace consolidation. Low left lung volume with streaky opacities in the left lung base. Prior median sternotomy with fracture of the inferior most sternotomy wire unchanged from prior. IMPRESSION: 1. Moderate right-sided pleural effusion with adjacent airspace consolidation which may reflect atelectasis or infiltrate. 2. Streaky opacities in the left lung base likely atelectasis. Electronically Signed   By: Dahlia Doyle M.D.   On: 04/06/2022  13:52  ? ?CT Head Wo Contrast ? ?Result Date: 04/06/2022 ?CLINICAL DATA:  Mental status change, unknown cause EXAM: CT HEAD WITHOUT CONTRAST TECHNIQUE: Contiguous axial images were obtained from the base of t

## 2022-04-07 NOTE — Procedures (Signed)
PROCEDURE SUMMARY: ? ?Successful US guided right thoracentesis. ?Yielded 2 L of amber-colored fluid. ?Pt tolerated procedure well. ?No immediate complications. ? ?Specimen sent for labs. ?CXR ordered; no post-procedure pneumothorax identified.  ? ?EBL < 2 mL ? ?Theresa Duty, NP ?04/07/2022 ?10:09 AM ? ? ? ?

## 2022-04-07 NOTE — Evaluation (Signed)
Clinical/Bedside Swallow Evaluation ?Patient Details  ?Name: James Doyle ?MRN: [id number] ?Date of Birth: 03-24-1946 ? ?Today's Date: 04/07/2022 ?Time: SLP Start Time (ACUTE ONLY): 1110 SLP Stop Time (ACUTE ONLY): 1125 ?SLP Time Calculation (min) (ACUTE ONLY): 15 min ? ?Past Medical History:  ?Past Medical History:  ?Diagnosis Date  ? Adenomatous polyps 03/17/2007  ? Aortic atherosclerosis ([place]) 12/27/2017  ? Noted on CT abd/pelvis  ? Arthritis   ? Cancer of left colon ([place]) 06/08/2017  ? Cellulitis of left hand   ? Chronic back pain   ? Chronic pain of right knee   ? Chronic right hip pain   ? Colon obstruction s/p colectomy/colostomy 06/09/2017 06/12/2017  ? DDD (degenerative disc disease), lumbar   ? H/O vocal cord paralysis   ? left   ? History of hypokalemia   ? History of thumb fracture   ? Left  ? Left nephrolithiasis 12/27/2017  ? Noted on CT abd/pelvis  ? ?Past Surgical History:  ?Past Surgical History:  ?Procedure Laterality Date  ? [place] SURGERY  2007  ? COLON RESECTION    ? COLONOSCOPY  03/2007  ? with polyp resection  ? COLOSTOMY    ? COLOSTOMY TAKEDOWN N/A 02/09/2018  ? Procedure: LAPAROSCOPIC COLOSTOMY REVERSAL ERAS PATHWAY;  Surgeon: Leighton Ruff, MD;  Location: [facility];  Service: General;  Laterality: N/A;  ? INNER EAR SURGERY    ? bilat  ? IR THORACENTESIS ASP PLEURAL SPACE W/IMG GUIDE  04/07/2022  ? LAPAROSCOPY N/A 06/09/2017  ? Procedure: LAPAROSCOPY DIAGNOSTIC, OPEN  LEFT COLECTOMY;  Surgeon: Leighton Ruff, MD;  Location: [facility];  Service: General;  Laterality: N/A;  ? LAPAROSCOPY N/A 12/24/2019  ? Procedure: LAPAROSCOPY DIAGNOSTIC lysis of adhesions;  Surgeon: Leighton Ruff, MD;  Location: [facility];  Service: General;  Laterality: N/A;  ? LAPAROTOMY N/A 12/24/2019  ? Procedure: small bowel resection;  Surgeon: Leighton Ruff, MD;  Location: [facility];  Service: General;  Laterality: N/A;  ? [place]  ? PROSTATE BIOPSY    ? right great saphenous laser ablation  2009  ? TONSILLECTOMY     ? TOTAL KNEE ARTHROPLASTY Right 07/27/2016  ? Procedure: RIGHT TOTAL KNEE ARTHROPLASTY;  Surgeon: Gaynelle Arabian, MD;  Location: [facility];  Service: Orthopedics;  Laterality: Right;  ? vocal cord surgery  2007  ? ?HPI:  ?CT angiogram negative for PE but large right-sided pleural effusion  ?  ?Assessment / Plan / Recommendation  ?Clinical Impression ? Patient currently presenting with an oropharyngeal swallow that is [facility] and without overt s/s aspiration or penetration. His daughter reported that his appetite has declined significantly from new medication he has been taking for cancer treatment. She denies observing patient with any coughing, choking, etc. SLP observed patient with PO's of regular texture solids and thin liquids. His granddaughter fed him a bite of a bacon egg and cheese biscuit and he drank sips of thin liquids via straw. Swallow initiation was timely and no overt s/s aspiration or penetration. He did exhibit prolonged mastication with solids but this appears largely due to his decrease overall attention and becoming distracted as he was talking while chewing most of the time. SLP is not recommending f/u skilled treatment/assessment related to his swallow function at this time. ?SLP Visit Diagnosis: Dysphagia, unspecified (R13.10) ?   ?Aspiration Risk ? No limitations;Mild aspiration risk  ?  ?Diet Recommendation Regular;Thin liquid  ? ?Medication Administration: Whole meds with liquid ?Supervision: Full supervision/cueing for compensatory strategies;Staff to assist  with self feeding ?Compensations: Minimize environmental distractions;Slow rate;Small sips/bites ?Postural Changes: Seated upright at 90 degrees  ?  ?Other  Recommendations Oral Care Recommendations: Oral care BID   ? ?Recommendations for follow up therapy are one component of a multi-disciplinary discharge planning process, led by the attending physician.  Recommendations may be updated based on patient status, additional functional  criteria and insurance authorization. ? ?Follow up Recommendations No SLP follow up  ? ? ?  ?Assistance Recommended at Discharge None  ?Functional Status Assessment Patient has had a recent decline in their functional status and demonstrates the ability to make significant improvements in function in a reasonable and predictable amount of time.  ?Frequency and Duration    ? N/A ?  ?   ? ?Prognosis   N/A ? ?  ? ?Swallow Study   ?General Date of Onset: 04/06/22 ?HPI: CT angiogram negative for PE but large right-sided pleural effusion ?Type of Study: Bedside Swallow Evaluation ?Previous Swallow Assessment: none ?Diet Prior to this Study: Regular;Thin liquids ?Temperature Spikes Noted: No ?Respiratory Status: Room air ?History of Recent Intubation: No ?Behavior/Cognition: Alert;Cooperative;Pleasant mood;Confused ?Oral Cavity Assessment: Dry ?Oral Care Completed by SLP: Recent completion by staff ?Oral Cavity - Dentition: Adequate natural dentition ?Self-Feeding Abilities: Needs assist;Total assist;Needs set up ?Patient Positioning: Upright in bed ?Baseline Vocal Quality: Normal ?Volitional Cough: Cognitively unable to elicit ?Volitional Swallow: Unable to elicit  ?  ?Oral/Motor/Sensory Function Overall Oral Motor/Sensory Function: Within functional limits   ?Ice Chips     ?Thin Liquid Thin Liquid: Within functional limits ?Presentation: Straw  ?  ?Nectar Thick     ?Honey Thick     ?Puree Puree: Not tested   ?Solid ? ? ?  Solid: Impaired ?Oral Phase Impairments: Impaired mastication ?Other Comments: patient with delayed mastication however this appears due to distraction/attention as he was talking while chewing  ? ?  ? ?Sonia Baller, MA, CCC-SLP ?Speech Therapy ? ? ? ? ? ?

## 2022-04-08 ENCOUNTER — Inpatient Hospital Stay (HOSPITAL_COMMUNITY): Payer: Medicare Other

## 2022-04-08 DIAGNOSIS — C186 Malignant neoplasm of descending colon: Secondary | ICD-10-CM | POA: Diagnosis not present

## 2022-04-08 DIAGNOSIS — I5031 Acute diastolic (congestive) heart failure: Secondary | ICD-10-CM | POA: Diagnosis not present

## 2022-04-08 DIAGNOSIS — G9340 Encephalopathy, unspecified: Secondary | ICD-10-CM | POA: Diagnosis not present

## 2022-04-08 LAB — CBC WITH DIFFERENTIAL/PLATELET
Abs Immature Granulocytes: 0.21 10*3/uL — ABNORMAL HIGH (ref 0.00–0.07)
Basophils Absolute: 0.1 10*3/uL (ref 0.0–0.1)
Basophils Relative: 0 %
Eosinophils Absolute: 0.2 10*3/uL (ref 0.0–0.5)
Eosinophils Relative: 2 %
HCT: 39.3 % (ref 39.0–52.0)
Hemoglobin: 12.4 g/dL — ABNORMAL LOW (ref 13.0–17.0)
Immature Granulocytes: 1 %
Lymphocytes Relative: 12 %
Lymphs Abs: 1.7 10*3/uL (ref 0.7–4.0)
MCH: 27.9 pg (ref 26.0–34.0)
MCHC: 31.6 g/dL (ref 30.0–36.0)
MCV: 88.3 fL (ref 80.0–100.0)
Monocytes Absolute: 1.6 10*3/uL — ABNORMAL HIGH (ref 0.1–1.0)
Monocytes Relative: 11 %
Neutro Abs: 10.8 10*3/uL — ABNORMAL HIGH (ref 1.7–7.7)
Neutrophils Relative %: 74 %
Platelets: 290 10*3/uL (ref 150–400)
RBC: 4.45 MIL/uL (ref 4.22–5.81)
RDW: 14.8 % (ref 11.5–15.5)
WBC: 14.5 10*3/uL — ABNORMAL HIGH (ref 4.0–10.5)
nRBC: 0 % (ref 0.0–0.2)

## 2022-04-08 LAB — COMPREHENSIVE METABOLIC PANEL
ALT: 32 U/L (ref 0–44)
AST: 52 U/L — ABNORMAL HIGH (ref 15–41)
Albumin: 2.1 g/dL — ABNORMAL LOW (ref 3.5–5.0)
Alkaline Phosphatase: 481 U/L — ABNORMAL HIGH (ref 38–126)
Anion gap: 5 (ref 5–15)
BUN: 9 mg/dL (ref 8–23)
CO2: 29 mmol/L (ref 22–32)
Calcium: 8.8 mg/dL — ABNORMAL LOW (ref 8.9–10.3)
Chloride: 101 mmol/L (ref 98–111)
Creatinine, Ser: 0.97 mg/dL (ref 0.61–1.24)
GFR, Estimated: >60 mL/min (ref >60)
Glucose, Bld: 107 mg/dL — ABNORMAL HIGH (ref 70–99)
Potassium: 3.9 mmol/L (ref 3.5–5.1)
Sodium: 135 mmol/L (ref 135–145)
Total Bilirubin: 0.9 mg/dL (ref 0.3–1.2)
Total Protein: 5.7 g/dL — ABNORMAL LOW (ref 6.5–8.1)

## 2022-04-08 LAB — MAGNESIUM: Magnesium: 2.1 mg/dL (ref 1.7–2.4)

## 2022-04-08 LAB — PROCALCITONIN: Procalcitonin: 0.58 ng/mL

## 2022-04-08 LAB — ECHOCARDIOGRAM COMPLETE
Area-P 1/2: 3.12 cm2
Calc EF: 62.1 %
Height: 73 in
S' Lateral: 2.5 cm
Single Plane A2C EF: 61.5 %
Single Plane A4C EF: 60.2 %
Weight: 3600 oz

## 2022-04-08 LAB — BRAIN NATRIURETIC PEPTIDE: B Natriuretic Peptide: 80.5 pg/mL (ref 0.0–100.0)

## 2022-04-08 MED ORDER — PERFLUTREN LIPID MICROSPHERE
1.0000 mL | INTRAVENOUS | Status: AC | PRN
  Administered 2022-04-08: 3 mL via INTRAVENOUS
  Filled 2022-04-08: qty 10

## 2022-04-08 MED ORDER — AMOXICILLIN-POT CLAVULANATE 875-125 MG PO TABS: 1.0000 | ORAL_TABLET | Freq: Two times a day (BID) | ORAL | 0 refills | Status: AC

## 2022-04-08 MED ORDER — HEPARIN SOD (PORK) LOCK FLUSH 100 UNIT/ML IV SOLN
500.0000 [IU] | INTRAVENOUS | Status: AC | PRN
  Administered 2022-04-08: 500 [IU]
  Filled 2022-04-08: qty 5

## 2022-04-08 NOTE — Discharge Summary (Signed)
?                                                                                ? James Doyle [id number] DOB: 07-26-1946 DOA: 04/06/2022 ? ?PCP: Clelia Croft, MD ? ?Admit date: 04/06/2022  Discharge date: 04/08/2022 ? ?Admitted From: Home   Disposition:  Home ? ? ?Recommendations for Outpatient Follow-up:  ? ?Follow up with PCP in 1-2 weeks ? ?PCP Please obtain BMP/CBC, 2 view CXR in 1week,  (see Discharge instructions)  ? ?PCP Please follow up on the following pending results: Please have long-term goals of care discussion with patient he will benefit from outpatient palliative care.  Long-term prognosis seems poor. ? ? ?Home Health: PT, RN and social work if he qualifies. ?Equipment/Devices: As below ?Consultations: Oncology, palliative care ?Discharge Condition: Guarded ?CODE STATUS: Full    ?Diet Recommendation: Soft ? ? ?Chief Complaint  ?Patient presents with  ? Loss of Consciousness  ?  ? ?Brief history of present illness from the day of admission and additional interim summary   ? ?76 y.o. 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  [facility]ording to family at [facility] patient was recently restarted on a new immunotherapy protocol for his cancer about 6 weeks ago and has been increasingly more tired and fatigued since then.  Yesterday after breakfast patient became increasingly more sleepy and appeared to be sweaty and clammy, there was no real loss of consciousness, no seizure-like activity or incontinence.  He was subsequently brought to the ER where he was found to have a large right-sided pleural effusion along with metabolic encephalopathy and admitted to the hospital.  ? ?                                                                Hospital Course  ? ?  ?Acute metabolic encephalopathy on top of advanced dementia in a patient with stage IV malignant colon cancer - his main symptom seems to be increased fatigue since the new immunotherapy protocol - Regorafenib - was started for his cancer 6 weeks ago at [facility], he also takes Lyrica and Cymbalta and appears to be slightly dehydrated.  His head CT is unremarkable, he has no focal deficits, unremarkable EEG.  With supportive care and holding Cymbalta, Lyrica his mentation is much improved and he is close to his baseline surrounded by family.  Symptom-free wants to go home.  Plan discussed with family members bedside on 04/08/2022.  He will get home RN and PT for close monitoring.  Request PCP to have long-term goals of care discussion as his long-term prognosis is not good, for now family wants to continue full code. ?  ?Stage IV metastatic colon cancer with mets to liver and right lung.  With most likely worsening malignant right-sided pleural  effusion, IR performed right-sided ultrasound-guided thoracentesis and removed 2 L of fluid.  Stable this morning.  Likely will have recurrent pleural effusion due to malignancy, if happens again PCP to reconsider Pleurx catheter placement outpatient ?  ?Mild resting sinus bradycardia.  With stable blood pressure, hold beta-blocker, stable TSH.  PCP to monitor off of beta-blocker. ?  ?Possible aspiration pneumonia with mild leukocytosis.  High risk due to #1 above.  Soft diet, speech saw the patient as well, given Unasyn here and now switch to Augmentin for 5 more days. ?  ?Essential tremors.  For now hold beta-blocker due to resting bradycardia. ?  ?Left bundle branch block noted on EKG.  EKG from 2 years ago did not have it, no chest pain however will check troponin baseline, not a candidate for invasive procedures due to #1 above, placed on aspirin, beta-blocker has to be held due to resting bradycardia, check baseline lipid  panel as well, echocardiogram stable.  Follow-up with PCP postdischarge.  Symptom-free from the standpoint ? ? ?Discharge diagnosis   ? ? ?Principal Problem: ?  AMS (altered mental status) ?Active Problems: ?  Cancer of left colon pT3, pN1b, s/p colectomy 2018 ?  Acute encephalopathy ?  Hypoxia ?  Recurrent right pleural effusion ?  Goals of care, counseling/discussion ? ? ? ?Discharge instructions   ? ?Discharge Instructions   ? ? Diet - low sodium heart healthy   Complete by: As directed ?  ? Discharge instructions   Complete by: As directed ?  ? Follow with Primary MD Clelia Croft, MD in 7 days follow-up with your oncologist within a week of discharge. ? ?Get CBC, CMP, 2 view Chest X ray -  checked next visit within 1 week by Primary MD   ? ?Activity: As tolerated with Full fall precautions use walker/cane & assistance as needed ? ?Disposition Home   ? ?Diet: Soft with feeding assistance and aspiration precautions. ? ?Special Instructions: If you have smoked or chewed Tobacco  in the last 2 yrs please stop smoking, stop any regular Alcohol  and or any Recreational drug use. ? ?On your next visit with your primary care physician please Get Medicines reviewed and adjusted. ? ?Please request your Prim.MD to go over all Hospital Tests and Procedure/Radiological results at the follow up, please get all Hospital records sent to your Prim MD by signing hospital release before you go home. ? ?If you experience worsening of your admission symptoms, develop shortness of breath, life threatening emergency, suicidal or homicidal thoughts you must seek medical attention immediately by calling 911 or calling your MD immediately  if symptoms less severe. ? ?You Must read complete instructions/literature along with all the possible adverse reactions/side effects for all the Medicines you take and that have been prescribed to you. Take any new Medicines after you have completely understood and accpet all the possible adverse  reactions/side effects.  ? ?  ? ? ?Discharge Medications  ? ?Allergies as of 04/08/2022   ? ?   Reactions  ? Gabapentin Other (See Comments)  ? Dizzy  ? ?  ? ?  ?Medication List  ?  ? ?STOP taking these medications   ? ?DULoxetine 30 MG capsule ?Commonly known as: CYMBALTA ?  ?fexofenadine 180 MG tablet ?Commonly known as: ALLEGRA ?  ?pregabalin 100 MG capsule ?Commonly known as: LYRICA ?  ?prochlorperazine 10 MG tablet ?Commonly known as: COMPAZINE ?  ?propranolol ER 60 MG 24 hr capsule ?Commonly known as: INDERAL LA ?  ? ?  ? ?  TAKE these medications   ? ?acetaminophen 500 MG tablet ?Commonly known as: TYLENOL ?Take 500 mg by mouth 2 (two) times daily. ?  ?amoxicillin-clavulanate 875-125 MG tablet ?Commonly known as: Augmentin ?Take 1 tablet by mouth 2 (two) times daily for 5 days. ?  ?diphenoxylate-atropine 2.5-0.025 MG tablet ?Commonly known as: LOMOTIL ?Take 1 tablet by mouth daily as needed for diarrhea or loose stools. ?  ?dorzolamide-timolol 22.3-6.8 MG/ML ophthalmic solution ?Commonly known as: COSOPT ?Place 1 drop into both eyes 2 (two) times daily. ?  ?GAS RELIEF MAXIMUM STRENGTH PO ?Take 1 tablet by mouth 2 (two) times daily. ?  ?latanoprost 0.005 % ophthalmic solution ?Commonly known as: XALATAN ?Place 1 drop into both eyes at bedtime. ?  ?lidocaine-prilocaine cream ?Commonly known as: EMLA ?1 application. daily as needed (port access). ?  ?ondansetron 8 MG tablet ?Commonly known as: Zofran ?Take 1 tablet (8 mg total) by mouth 2 (two) times daily as needed for refractory nausea / vomiting. Start on day 3 after chemotherapy. ?What changed:  ?when to take this ?additional instructions ?  ?polyethylene glycol 17 g packet ?Commonly known as: MIRALAX / GLYCOLAX ?Take 17 g by mouth daily as needed for mild constipation. Hold for diarrhea ?  ? ?  ? ?  ?  ? ? ?  ?Durable Medical Equipment  ?(From admission, onward)  ?  ? ? ?  ? ?  Start     Ordered  ? 04/08/22 1056  For home use only DME Walker rolling  Once        ?Comments: 5 wheel  ?Question Answer Comment  ?Walker: With 5 Inch Wheels   ?Patient needs a walker to treat with the following condition Weakness   ?  ? 04/08/22 1055  ? ?  ?  ? ?  ? ? ? Follow-up I

## 2022-04-08 NOTE — Progress Notes (Signed)
Patient left floor accompanied by staff no c/o pain or shortness of breath at d/c. Family has spoken with [facility] and appts made for them to come out per granddaughter at bedside. ? ?Nathan Stallworth, Tivis Ringer, RN ? ?

## 2022-04-08 NOTE — Plan of Care (Signed)

## 2022-04-08 NOTE — Progress Notes (Signed)
?  Echocardiogram ?2D Echocardiogram has been performed. ? ?James Doyle ?04/08/2022, 9:44 AM ?

## 2022-04-08 NOTE — Progress Notes (Signed)
Physical Therapy Treatment ?Patient Details ?Name: James Doyle ?MRN: [id number] ?DOB: 13-Jan-1946 ?Today's Date: 04/08/2022 ? ? ?History of Present Illness Pt is a 76 y/o male admitted secondary to AMS. Thought to be metabolic encephalopathy. Pt also with R pleural effusion and s/p thoracentesis on 5/9. PMH includes colon CA, memory deficits, and R TKA. ? ?  ?PT Comments  ? ? Session focused on progressing pt's mobility independence, simulating home set-up, and providing pt's family with education on how to assist him safely. Pt needing extra time and encouragement to initiate tasks, but once he initiates he is able to complete transition sidelying > sit with only minA and bed flat. Pt did require modA to roll due to poor initiation. Pt also needing modA to transfer to stand and min guard assist to ambulate up to ~110 ft with a RW today. He displays poor carryover of education from prior session on remaining proximal to the RW when ambulating, needing reminders often today. Educated family on proper, safe guarding of pt on stairs. Provided family with gait belt. Family reporting no further questions at this time. Will continue to follow acutely. Current recommendations remain appropriate. ?   ?Recommendations for follow up therapy are one component of a multi-disciplinary discharge planning process, led by the attending physician.  Recommendations may be updated based on patient status, additional functional criteria and insurance authorization. ? ?Follow Up Recommendations ? Home health PT ?  ?  ?Assistance Recommended at Discharge Frequent or constant Supervision/Assistance  ?Patient can return home with the following A little help with walking and/or transfers;A little help with bathing/dressing/bathroom;Assistance with cooking/housework;Help with stairs or ramp for entrance;Assist for transportation;Direct supervision/assist for financial management;Direct supervision/assist for medications management ?   ?Equipment Recommendations ? None recommended by PT  ?  ?Recommendations for Other Services   ? ? ?  ?Precautions / Restrictions Precautions ?Precautions: Fall ?Restrictions ?Weight Bearing Restrictions: No  ?  ? ?Mobility ? Bed Mobility ?Overal bed mobility: Needs Assistance ?Bed Mobility: Rolling, Sidelying to Sit ?Rolling: Mod assist ?Sidelying to sit: Min assist ?  ?  ?  ?General bed mobility comments: Bed flat to simulate home set-up. Family present to observe and educated on technique and to allow pt to initiate before they assist for their safety. Pt needing cues to flex knee and reach to roll to L, modA. MinA with pt putting his UE around PT to initiate trunk ascension. Once initiated, pt able to complete without further assistance. ?  ? ?Transfers ?Overall transfer level: Needs assistance ?Equipment used: Rolling walker (2 wheels) ?Transfers: Sit to/from Stand ?Sit to Stand: Mod assist ?  ?  ?  ?  ?  ?General transfer comment: ModA to power up to stand and transition weight anteriorly to RW from EOB. Repeated cues provided for hand placement. ?  ? ?Ambulation/Gait ?Ambulation/Gait assistance: Min guard ?Gait Distance (Feet): 110 Feet ?Assistive device: Rolling walker (2 wheels) ?Gait Pattern/deviations: Step-through pattern, Decreased stride length, Trunk flexed ?Gait velocity: reduced ?Gait velocity interpretation: <1.31 ft/sec, indicative of household ambulator ?  ?General Gait Details: Pt with flexed posture and inferior gaze with tendency to push RW distal to him, especially when turning. Pt needing cues to remain within RW, especially when turning. No LOB, min guard for safety. ? ? ?Stairs ?  ?  ?  ?  ?General stair comments: Verbally reviewed pt using handrails and proper positioning of family members to provide +2 guarding for pt safety with entering/exiting home. They verbalized understanding. ? ? ?  Wheelchair Mobility ?  ? ?Modified Rankin (Stroke Patients Only) ?  ? ? ?  ?Balance Overall balance  assessment: Needs assistance ?Sitting-balance support: Feet supported, No upper extremity supported ?Sitting balance-Leahy Scale: Fair ?Sitting balance - Comments: Supervision for safety with static sitting EOB ?  ?Standing balance support: Bilateral upper extremity supported, During functional activity, Reliant on assistive device for balance ?Standing balance-Leahy Scale: Poor ?Standing balance comment: Reliant on RW ?  ?  ?  ?  ?  ?  ?  ?  ?  ?  ?  ?  ? ?  ?Cognition Arousal/Alertness: Awake/alert ?Behavior During Therapy: [facility] for tasks assessed/performed ?Overall Cognitive Status: History of cognitive impairments - at baseline ?  ?  ?  ?  ?  ?  ?  ?  ?  ?  ?  ?  ?  ?  ?  ?  ?General Comments: Pt needing cues to initiate and sequence tasks. Memory deficits at baseline. HOH. ?  ?  ? ?  ?Exercises   ? ?  ?General Comments General comments (skin integrity, edema, etc.): Pt's family present throughout ?  ?  ? ?Pertinent Vitals/Pain Pain Assessment ?Pain Assessment: Faces ?Faces Pain Scale: No hurt ?Pain Intervention(s): Monitored during session  ? ? ?Home Living Family/patient expects to be discharged to:: Private residence ?Living Arrangements: Spouse/significant other ?Available Help at Discharge: Family;Available 24 hours/day ?Type of Home: House ?Home Access: Stairs to enter ?Entrance Stairs-Rails: Right;Left;Can reach both ?Entrance Stairs-Number of Steps: 4 ?  ?Home Layout: One level ?Home Equipment: Shower seat - built in;Toilet riser;Rolling Walker (2 wheels);Cane - single point;Rollator (4 wheels);BSC/3in1 ?   ?  ?Prior Function    ?  ?  ?   ? ?PT Goals (current goals can now be found in the care plan section) Acute Rehab PT Goals ?Patient Stated Goal: to go home ?PT Goal Formulation: With patient/family ?Time For Goal Achievement: 04/21/22 ?Potential to Achieve Goals: Good ?Progress towards PT goals: Progressing toward goals ? ?  ?Frequency ? ? ? Min 3X/week ? ? ? ?  ?PT Plan Current plan remains  appropriate  ? ? ?Co-evaluation   ?  ?  ?  ?  ? ?  ?AM-PAC PT "6 Clicks" Mobility   ?Outcome Measure ? Help needed turning from your back to your side while in a flat bed without using bedrails?: A Lot ?Help needed moving from lying on your back to sitting on the side of a flat bed without using bedrails?: A Little ?Help needed moving to and from a bed to a chair (including a wheelchair)?: A Little ?Help needed standing up from a chair using your arms (e.g., wheelchair or bedside chair)?: A Lot ?Help needed to walk in hospital room?: A Little ?Help needed climbing 3-5 steps with a railing? : A Lot ?6 Click Score: 15 ? ?  ?End of Session   ?Activity Tolerance: Patient tolerated treatment well ?Patient left: in chair;with call bell/phone within reach;with family/visitor present ?  ?PT Visit Diagnosis: Unsteadiness on feet (R26.81);Muscle weakness (generalized) (M62.81);Difficulty in walking, not elsewhere classified (R26.2);Other abnormalities of gait and mobility (R26.89) ?  ? ? ?Time: 9604-5409 ?PT Time Calculation (min) (ACUTE ONLY): 18 min ? ?Charges:  $Therapeutic Activity: 8-22 mins          ?          ? ?Moishe Spice, PT, DPT ?Acute Rehabilitation Services  ?Pager: [phone] ?Office: [phone] ? ? ? ?James Doyle ?04/08/2022, 1:38 PM ? ?

## 2022-04-08 NOTE — Evaluation (Signed)
Occupational Therapy Evaluation ?Patient Details ?Name: James Doyle ?MRN: [id number] ?DOB: 1946-04-22 ?Today's Date: 04/08/2022 ? ? ?History of Present Illness Pt is a 76 y/o male admitted secondary to AMS. Thought to be metabolic encephalopathy. Pt also with R pleural effusion and s/p thoracentesis on 5/9. PMH includes colon CA, memory deficits, and R TKA.  ? ?Clinical Impression ?  ?Pt admitted with above and presents to OT with impairments impacting his ability to complete ADLs and functional mobility at [facility].  Per pt's wife, pt with steady decline over the last few months requiring increased assistance with bathing and dressing tasks.  Pt requires up to Mod A for sit > stand from lower surfaces, but is min guard with RW once upright.  Pt is very HOH and requires repetition for cues to engage in assessment and self-care tasks. Pt would benefit from continued OT services with education on AE and adaptive strategies to increase pt participation in self-care tasks and decrease burden of care.  Pt to d/c home with family this afternoon, therefore recommend [place] upon d/c. ?   ? ?Recommendations for follow up therapy are one component of a multi-disciplinary discharge planning process, led by the attending physician.  Recommendations may be updated based on patient status, additional functional criteria and insurance authorization.  ? ?Follow Up Recommendations ? Home health OT  ?  ?Assistance Recommended at Discharge Frequent or constant Supervision/Assistance  ?Patient can return home with the following A little help with walking and/or transfers;A lot of help with bathing/dressing/bathroom;Assistance with cooking/housework;Direct supervision/assist for medications management;Assist for transportation;Help with stairs or ramp for entrance ? ?  ?Functional Status Assessment ? Patient has had a recent decline in their functional status and/or demonstrates limited ability to make significant improvements in function in a  reasonable and predictable amount of time  ?Equipment Recommendations ? None recommended by OT  ?  ?Recommendations for Other Services   ? ? ?  ?Precautions / Restrictions Precautions ?Precautions: Fall ?Restrictions ?Weight Bearing Restrictions: No  ? ?  ? ?Mobility Bed Mobility ? Received OOB ?  ?  ?  ?  ?  ?  ?  ?  ? ?Transfers ?Overall transfer level: Needs assistance ?Equipment used: Rolling walker (2 wheels) ?Transfers: Sit to/from Stand ?Sit to Stand: Mod assist ?  ?  ?  ?  ?  ?General transfer comment: ModA to power up to stand and transition weight anteriorly to RW from arm chair. Repeated cues provided for hand placement. ?  ? ?  ?   ? ?ADL either performed or assessed with clinical judgement  ? ?ADL Overall ADL's : Needs assistance/impaired ?  ?  ?  ?  ?  ?  ?  ?  ?  ?  ?  ?  ?  ?  ?  ?  ?  ?  ?Functional mobility during ADLs: Minimal assistance;Rolling walker (2 wheels) ?General ADL Comments: Pt requires mod-max assist for LB dressing at baseline due to decreased ability to reach BLE and requiring cognitive cues for sequencing.  Wife will occasionally have him in the shower, but despite grab bars and shower chair feels that he is still too unsafe/unpredictable for her.  ? ? ? ?Vision   ?Vision Assessment?: No apparent visual deficits  ?   ?   ?   ? ?Pertinent Vitals/Pain Pain Assessment ?Pain Assessment: Faces ?Faces Pain Scale: No hurt  ? ? ? ?   ?Extremity/Trunk Assessment Upper Extremity Assessment ?Upper Extremity Assessment: Generalized weakness ?  ?  ?  ?  Cervical / Trunk Assessment ?Cervical / Trunk Assessment: Kyphotic ?  ?Communication Communication ?Communication: HOH ?  ?Cognition Arousal/Alertness: Awake/alert ?Behavior During Therapy: [facility] for tasks assessed/performed ?Overall Cognitive Status: History of cognitive impairments - at baseline ?  ?  ?  ?  ?  ?  ?  ?  ?  ?  ?  ?  ?  ?  ?  ?  ?General Comments: Memory deficits at baseline. ?  ?  ?General Comments  Pt's family present throughout ? ?   ?   ?   ? ? ?Home Living Family/patient expects to be discharged to:: Private residence ?Living Arrangements: Spouse/significant other ?Available Help at Discharge: Family;Available 24 hours/day ?Type of Home: House ?Home Access: Stairs to enter ?Entrance Stairs-Number of Steps: 4 ?Entrance Stairs-Rails: Right;Left;Can reach both ?Home Layout: One level ?  ?  ?Bathroom Shower/Tub: Walk-in shower ?  ?Bathroom Toilet: Handicapped height ?  ?  ?Home Equipment: Shower seat - built in;Toilet riser;Rolling Walker (2 wheels);Cane - single point;Rollator (4 wheels);BSC/3in1 ?  ?  ?  ? ?  ?Prior Functioning/Environment Prior Level of Function : Needs assist ? Cognitive Assist : ADLs (cognitive) ?  ?ADLs (Cognitive): Step by step cues ?  ?  ?  ?Mobility Comments: Uses rollator outside of house. RW inside of home. Sometimes requires physical assist for bed mobility and other mobility tasks. ?ADLs Comments: Wife provides mod-max assist with bathing/dressing ?  ? ?  ?  ?OT Problem List: Decreased strength;Decreased activity tolerance;Impaired balance (sitting and/or standing);Decreased cognition;Decreased safety awareness;Decreased knowledge of use of DME or AE;Decreased knowledge of precautions ?  ?   ?OT Treatment/Interventions:    ?  ?OT Goals(Current goals can be found in the care plan section) Acute Rehab OT Goals ?Patient Stated Goal: to go home ?OT Goal Formulation: All assessment and education complete, DC therapy  ?OT Frequency:   ?  ? ?   ?AM-PAC OT "6 Clicks" Daily Activity     ?Outcome Measure Help from another person eating meals?: None ?Help from another person taking care of personal grooming?: A Little ?Help from another person toileting, which includes using toliet, bedpan, or urinal?: A Little ?Help from another person bathing (including washing, rinsing, drying)?: A Lot ?Help from another person to put on and taking off regular upper body clothing?: A Little ?Help from another person to put on and taking off  regular lower body clothing?: A Lot ?6 Click Score: 17 ?  ?End of Session Equipment Utilized During Treatment: Gait belt;Rolling walker (2 wheels) ?Nurse Communication: Mobility status ? ?Activity Tolerance: Patient limited by fatigue ?Patient left: in chair;with family/visitor present ? ?OT Visit Diagnosis: Unsteadiness on feet (R26.81);Other abnormalities of gait and mobility (R26.89);Cognitive communication deficit (R41.841)  ?              ?Time: 8563-1497 ?OT Time Calculation (min): 17 min ?Charges:  OT General Charges ?$OT Visit: 1 Visit ?OT Evaluation ?$OT Eval Low Complexity: 1 Low ? ?Simonne Come, MS, OTR/L ?Office [phone] ? ?Simonne Come ?04/08/2022, 1:46 PM ?

## 2022-04-08 NOTE — Discharge Instructions (Signed)
Follow with Primary MD Clelia Croft, MD in 7 days follow-up with your oncologist within a week of discharge. ? ?Get CBC, CMP, 2 view Chest X ray -  checked next visit within 1 week by Primary MD   ? ?Activity: As tolerated with Full fall precautions use walker/cane & assistance as needed ? ?Disposition Home   ? ?Diet: Soft with feeding assistance and aspiration precautions. ? ?Special Instructions: If you have smoked or chewed Tobacco  in the last 2 yrs please stop smoking, stop any regular Alcohol  and or any Recreational drug use. ? ?On your next visit with your primary care physician please Get Medicines reviewed and adjusted. ? ?Please request your Prim.MD to go over all Hospital Tests and Procedure/Radiological results at the follow up, please get all Hospital records sent to your Prim MD by signing hospital release before you go home. ? ?If you experience worsening of your admission symptoms, develop shortness of breath, life threatening emergency, suicidal or homicidal thoughts you must seek medical attention immediately by calling 911 or calling your MD immediately  if symptoms less severe. ? ?You Must read complete instructions/literature along with all the possible adverse reactions/side effects for all the Medicines you take and that have been prescribed to you. Take any new Medicines after you have completely understood and accpet all the possible adverse reactions/side effects.  ? ?  ?

## 2022-04-08 NOTE — Progress Notes (Signed)
Discharge instructions reviewed with pt, wife and daughter.  ?Copy of instructions given to pt/daughter, informed 1 script sent to his home pharmacy.  ?Granddaughter at bedside and states physical therapy (PT) coming back shortly to work with pt before discharge.  ?Pt will be ready for discharge after PT. Bedside RN informed.   ?

## 2022-04-08 NOTE — Progress Notes (Signed)
?  Transition of Care (TOC) Screening Note ? ? ?Patient Details  ?Name: James Doyle ?Date of Birth: August 28, 1946 ? ? ?Transition of Care (TOC) CM/SW Contact:    ?Cyndi Bender, RN ?Phone Number: ?04/08/2022, 8:39 AM ? ? ? ?Transition of Care Department [facility]) has reviewed patient and no TOC needs have been identified at this time. We will continue to monitor patient advancement through interdisciplinary progression rounds. If new patient transition needs arise, please place a TOC consult. ? ? ?

## 2022-04-10 LAB — CYTOLOGY - NON PAP

## 2022-04-10 LAB — FLOW CYTOMETRY REQUEST - FLUID (INPATIENT)

## 2022-04-12 LAB — CULTURE, BODY FLUID W GRAM STAIN -BOTTLE: Culture: NO GROWTH

## 2022-05-06 LAB — MISC LABCORP TEST (SEND OUT): Labcorp test code: 9985

## 2022-05-30 DEATH — deceased

## 2022-06-22 ENCOUNTER — Other Ambulatory Visit: Payer: Self-pay
